# Patient Record
Sex: Female | Born: 1985 | Race: Black or African American | Hispanic: No | Marital: Single | Smoking: Current every day smoker
Health system: Southern US, Community
[De-identification: ages and names within clinical notes are randomized; demographics above are authoritative.]

## PROBLEM LIST (undated history)

## (undated) ENCOUNTER — Inpatient Hospital Stay (HOSPITAL_COMMUNITY): Payer: Self-pay

## (undated) ENCOUNTER — Inpatient Hospital Stay (HOSPITAL_COMMUNITY): Payer: Medicaid Other

## (undated) DIAGNOSIS — I10 Essential (primary) hypertension: Secondary | ICD-10-CM

## (undated) DIAGNOSIS — F53 Postpartum depression: Secondary | ICD-10-CM

## (undated) DIAGNOSIS — A749 Chlamydial infection, unspecified: Secondary | ICD-10-CM

## (undated) DIAGNOSIS — O99345 Other mental disorders complicating the puerperium: Secondary | ICD-10-CM

## (undated) DIAGNOSIS — E039 Hypothyroidism, unspecified: Secondary | ICD-10-CM

## (undated) DIAGNOSIS — O09219 Supervision of pregnancy with history of pre-term labor, unspecified trimester: Secondary | ICD-10-CM

## (undated) DIAGNOSIS — D649 Anemia, unspecified: Secondary | ICD-10-CM

## (undated) DIAGNOSIS — R011 Cardiac murmur, unspecified: Secondary | ICD-10-CM

## (undated) DIAGNOSIS — N883 Incompetence of cervix uteri: Secondary | ICD-10-CM

## (undated) HISTORY — PX: CERVICAL CERCLAGE: SHX1329

## (undated) HISTORY — DX: Essential (primary) hypertension: I10

## (undated) HISTORY — DX: Incompetence of cervix uteri: N88.3

## (undated) HISTORY — DX: Supervision of pregnancy with history of pre-term labor, unspecified trimester: O09.219

## (undated) HISTORY — DX: Postpartum depression: F53.0

## (undated) HISTORY — DX: Other mental disorders complicating the puerperium: O99.345

## (undated) HISTORY — DX: Anemia, unspecified: D64.9

## (undated) HISTORY — PX: PILONIDAL CYST / SINUS EXCISION: SUR543

## (undated) HISTORY — DX: Chlamydial infection, unspecified: A74.9

---

## 2004-12-17 DIAGNOSIS — F53 Postpartum depression: Secondary | ICD-10-CM

## 2004-12-17 HISTORY — DX: Postpartum depression: F53.0

## 2005-01-29 ENCOUNTER — Ambulatory Visit (HOSPITAL_COMMUNITY): Admission: AD | Admit: 2005-01-29 | Discharge: 2005-01-29 | Payer: Self-pay | Admitting: *Deleted

## 2005-02-03 ENCOUNTER — Ambulatory Visit (HOSPITAL_COMMUNITY): Admission: AD | Admit: 2005-02-03 | Discharge: 2005-02-03 | Payer: Self-pay | Admitting: *Deleted

## 2010-03-30 ENCOUNTER — Inpatient Hospital Stay (HOSPITAL_COMMUNITY): Admission: AD | Admit: 2010-03-30 | Discharge: 2010-03-30 | Payer: Self-pay | Admitting: Obstetrics and Gynecology

## 2010-04-05 ENCOUNTER — Inpatient Hospital Stay (HOSPITAL_COMMUNITY): Admission: AD | Admit: 2010-04-05 | Discharge: 2010-04-05 | Payer: Self-pay | Admitting: Obstetrics and Gynecology

## 2010-05-04 ENCOUNTER — Ambulatory Visit: Payer: Self-pay | Admitting: Obstetrics and Gynecology

## 2010-05-04 ENCOUNTER — Encounter (INDEPENDENT_AMBULATORY_CARE_PROVIDER_SITE_OTHER): Payer: Self-pay | Admitting: *Deleted

## 2010-05-19 ENCOUNTER — Encounter: Payer: Self-pay | Admitting: Family Medicine

## 2010-05-19 ENCOUNTER — Ambulatory Visit (HOSPITAL_COMMUNITY): Admission: RE | Admit: 2010-05-19 | Discharge: 2010-05-19 | Payer: Self-pay | Admitting: Family Medicine

## 2010-05-26 ENCOUNTER — Emergency Department (HOSPITAL_COMMUNITY): Admission: EM | Admit: 2010-05-26 | Discharge: 2010-05-26 | Payer: Self-pay | Admitting: Emergency Medicine

## 2010-06-01 ENCOUNTER — Ambulatory Visit: Payer: Self-pay | Admitting: Obstetrics & Gynecology

## 2010-06-03 ENCOUNTER — Emergency Department (HOSPITAL_COMMUNITY): Admission: EM | Admit: 2010-06-03 | Discharge: 2010-06-03 | Payer: Self-pay | Admitting: Emergency Medicine

## 2010-06-07 ENCOUNTER — Ambulatory Visit: Payer: Self-pay | Admitting: Obstetrics & Gynecology

## 2010-06-07 ENCOUNTER — Ambulatory Visit (HOSPITAL_COMMUNITY): Admission: RE | Admit: 2010-06-07 | Discharge: 2010-06-07 | Payer: Self-pay | Admitting: Obstetrics & Gynecology

## 2010-06-14 ENCOUNTER — Inpatient Hospital Stay (HOSPITAL_COMMUNITY): Admission: AD | Admit: 2010-06-14 | Discharge: 2010-06-14 | Payer: Self-pay | Admitting: Obstetrics & Gynecology

## 2010-06-22 ENCOUNTER — Ambulatory Visit: Payer: Self-pay | Admitting: Family Medicine

## 2010-06-22 ENCOUNTER — Encounter (INDEPENDENT_AMBULATORY_CARE_PROVIDER_SITE_OTHER): Payer: Self-pay | Admitting: *Deleted

## 2010-06-22 ENCOUNTER — Other Ambulatory Visit: Payer: Self-pay | Admitting: Obstetrics & Gynecology

## 2010-06-22 LAB — CONVERTED CEMR LAB
Chlamydia, DNA Probe: NEGATIVE
GC Probe Amp, Genital: NEGATIVE

## 2010-06-23 ENCOUNTER — Encounter (INDEPENDENT_AMBULATORY_CARE_PROVIDER_SITE_OTHER): Payer: Self-pay | Admitting: *Deleted

## 2010-06-23 LAB — CONVERTED CEMR LAB: Trich, Wet Prep: NONE SEEN

## 2010-07-04 ENCOUNTER — Encounter: Payer: Self-pay | Admitting: Family Medicine

## 2010-07-04 ENCOUNTER — Ambulatory Visit (HOSPITAL_COMMUNITY): Admission: RE | Admit: 2010-07-04 | Discharge: 2010-07-04 | Payer: Self-pay | Admitting: Family Medicine

## 2010-07-27 ENCOUNTER — Ambulatory Visit: Payer: Self-pay | Admitting: Obstetrics & Gynecology

## 2010-07-28 ENCOUNTER — Encounter: Payer: Self-pay | Admitting: Obstetrics & Gynecology

## 2010-07-28 ENCOUNTER — Encounter (INDEPENDENT_AMBULATORY_CARE_PROVIDER_SITE_OTHER): Payer: Self-pay | Admitting: *Deleted

## 2010-07-28 LAB — CONVERTED CEMR LAB: Trich, Wet Prep: NONE SEEN

## 2010-08-10 ENCOUNTER — Ambulatory Visit: Payer: Self-pay | Admitting: Obstetrics and Gynecology

## 2010-08-10 ENCOUNTER — Ambulatory Visit (HOSPITAL_COMMUNITY): Admission: RE | Admit: 2010-08-10 | Discharge: 2010-08-10 | Payer: Self-pay | Admitting: Family Medicine

## 2010-08-10 ENCOUNTER — Encounter: Payer: Self-pay | Admitting: Family Medicine

## 2010-08-16 ENCOUNTER — Ambulatory Visit: Payer: Self-pay | Admitting: Advanced Practice Midwife

## 2010-08-16 ENCOUNTER — Inpatient Hospital Stay (HOSPITAL_COMMUNITY): Admission: AD | Admit: 2010-08-16 | Discharge: 2010-08-17 | Payer: Self-pay | Admitting: Obstetrics & Gynecology

## 2010-08-28 ENCOUNTER — Ambulatory Visit: Payer: Self-pay | Admitting: Obstetrics & Gynecology

## 2010-08-30 ENCOUNTER — Ambulatory Visit: Payer: Self-pay | Admitting: Obstetrics & Gynecology

## 2010-09-21 ENCOUNTER — Ambulatory Visit: Payer: Self-pay | Admitting: Obstetrics & Gynecology

## 2010-09-21 ENCOUNTER — Inpatient Hospital Stay (HOSPITAL_COMMUNITY): Admission: AD | Admit: 2010-09-21 | Discharge: 2010-09-21 | Payer: Self-pay | Admitting: Obstetrics & Gynecology

## 2010-10-18 ENCOUNTER — Ambulatory Visit: Payer: Self-pay | Admitting: Family Medicine

## 2010-10-18 ENCOUNTER — Inpatient Hospital Stay (HOSPITAL_COMMUNITY): Admission: AD | Admit: 2010-10-18 | Discharge: 2010-10-18 | Payer: Self-pay | Admitting: Family Medicine

## 2010-10-26 ENCOUNTER — Encounter (INDEPENDENT_AMBULATORY_CARE_PROVIDER_SITE_OTHER): Payer: Self-pay | Admitting: *Deleted

## 2010-10-26 ENCOUNTER — Ambulatory Visit: Payer: Self-pay | Admitting: Obstetrics & Gynecology

## 2010-10-26 LAB — CONVERTED CEMR LAB: GC Probe Amp, Genital: NEGATIVE

## 2010-10-27 ENCOUNTER — Encounter (INDEPENDENT_AMBULATORY_CARE_PROVIDER_SITE_OTHER): Payer: Self-pay | Admitting: *Deleted

## 2010-11-02 ENCOUNTER — Encounter (INDEPENDENT_AMBULATORY_CARE_PROVIDER_SITE_OTHER): Payer: Self-pay | Admitting: *Deleted

## 2010-11-02 ENCOUNTER — Ambulatory Visit: Payer: Self-pay | Admitting: Obstetrics & Gynecology

## 2010-11-03 ENCOUNTER — Encounter (INDEPENDENT_AMBULATORY_CARE_PROVIDER_SITE_OTHER): Payer: Self-pay | Admitting: *Deleted

## 2010-11-03 LAB — CONVERTED CEMR LAB
HCT: 32.5 % — ABNORMAL LOW (ref 36.0–46.0)
Hemoglobin: 10.4 g/dL — ABNORMAL LOW (ref 12.0–15.0)
MCV: 82.3 fL (ref 78.0–100.0)
Platelets: 307 10*3/uL (ref 150–400)
WBC: 11.5 10*3/uL — ABNORMAL HIGH (ref 4.0–10.5)

## 2010-11-06 ENCOUNTER — Ambulatory Visit: Payer: Self-pay | Admitting: Obstetrics & Gynecology

## 2010-11-20 ENCOUNTER — Inpatient Hospital Stay (HOSPITAL_COMMUNITY)
Admission: AD | Admit: 2010-11-20 | Discharge: 2010-11-20 | Payer: Self-pay | Source: Home / Self Care | Admitting: Obstetrics and Gynecology

## 2010-11-20 ENCOUNTER — Ambulatory Visit: Payer: Self-pay | Admitting: Obstetrics & Gynecology

## 2010-11-22 ENCOUNTER — Observation Stay (HOSPITAL_COMMUNITY)
Admission: AD | Admit: 2010-11-22 | Discharge: 2010-11-22 | Payer: Self-pay | Source: Home / Self Care | Admitting: Obstetrics and Gynecology

## 2010-11-23 ENCOUNTER — Inpatient Hospital Stay (HOSPITAL_COMMUNITY)
Admission: AD | Admit: 2010-11-23 | Discharge: 2010-11-26 | Payer: Self-pay | Source: Home / Self Care | Attending: Obstetrics and Gynecology | Admitting: Obstetrics and Gynecology

## 2010-11-27 ENCOUNTER — Ambulatory Visit: Payer: Self-pay | Admitting: Physician Assistant

## 2010-12-13 ENCOUNTER — Ambulatory Visit: Payer: Self-pay | Admitting: Obstetrics & Gynecology

## 2010-12-17 NOTE — L&D Delivery Note (Signed)
Delivery Note At 8:01 AM a viable female was delivered via Vaginal, Spontaneous Delivery (Presentation: Left Occiput Anterior).  APGAR: 9/9, ; weight .   Placenta status: Intact, Spontaneous.  Cord: 3 vessels with the following complications: None.   Anesthesia: None  Episiotomy: None Lacerations: None Suture Repair: n/a Est. Blood Loss (mL): 400  Mom to postpartum.  Baby to nursery-stable.  CRESENZO-DISHMAN,Teigan Sahli 10/19/2011, 8:18 AM

## 2011-01-04 ENCOUNTER — Emergency Department (HOSPITAL_COMMUNITY)
Admission: EM | Admit: 2011-01-04 | Discharge: 2011-01-04 | Payer: Self-pay | Source: Home / Self Care | Admitting: Emergency Medicine

## 2011-01-07 ENCOUNTER — Encounter: Payer: Self-pay | Admitting: Family Medicine

## 2011-02-27 LAB — POCT URINALYSIS DIPSTICK
Bilirubin Urine: NEGATIVE
Bilirubin Urine: NEGATIVE
Glucose, UA: NEGATIVE mg/dL
Glucose, UA: NEGATIVE mg/dL
Ketones, ur: NEGATIVE mg/dL
Ketones, ur: NEGATIVE mg/dL
Nitrite: NEGATIVE
Nitrite: NEGATIVE
Nitrite: NEGATIVE
Protein, ur: 30 mg/dL — AB
Protein, ur: NEGATIVE mg/dL
Protein, ur: NEGATIVE mg/dL
Specific Gravity, Urine: >=1.03 (ref 1.005–1.030)
Specific Gravity, Urine: >=1.03 (ref 1.005–1.030)
Urobilinogen, UA: 1 mg/dL (ref 0.0–1.0)
Urobilinogen, UA: 4 mg/dL — ABNORMAL HIGH (ref 0.0–1.0)
pH: 6.5 (ref 5.0–8.0)
pH: 6 (ref 5.0–8.0)
pH: 6 (ref 5.0–8.0)

## 2011-02-27 LAB — CBC
HCT: 31.5 % — ABNORMAL LOW (ref 36.0–46.0)
HCT: 33.5 % — ABNORMAL LOW (ref 36.0–46.0)
Hemoglobin: 11.2 g/dL — ABNORMAL LOW (ref 12.0–15.0)
MCH: 27.6 pg (ref 26.0–34.0)
MCH: 28.4 pg (ref 26.0–34.0)
MCHC: 33.7 g/dL (ref 30.0–36.0)
MCV: 83.6 fL (ref 78.0–100.0)
MCV: 84 fL (ref 78.0–100.0)
MCV: 84.2 fL (ref 78.0–100.0)
Platelets: 284 10*3/uL (ref 150–400)
RBC: 3.29 MIL/uL — ABNORMAL LOW (ref 3.87–5.11)
RBC: 3.99 MIL/uL (ref 3.87–5.11)
RDW: 15.1 % (ref 11.5–15.5)
RDW: 15.2 % (ref 11.5–15.5)
RDW: 15.5 % (ref 11.5–15.5)
WBC: 10.9 10*3/uL — ABNORMAL HIGH (ref 4.0–10.5)
WBC: 13.4 10*3/uL — ABNORMAL HIGH (ref 4.0–10.5)

## 2011-02-27 LAB — URINE MICROSCOPIC-ADD ON

## 2011-02-27 LAB — RAPID URINE DRUG SCREEN, HOSP PERFORMED
Amphetamines: NOT DETECTED
Barbiturates: NOT DETECTED
Benzodiazepines: NOT DETECTED
Opiates: NOT DETECTED
Tetrahydrocannabinol: NOT DETECTED

## 2011-02-27 LAB — COMPREHENSIVE METABOLIC PANEL
ALT: 15 U/L (ref 0–35)
Alkaline Phosphatase: 155 U/L — ABNORMAL HIGH (ref 39–117)
BUN: 8 mg/dL (ref 6–23)
CO2: 23 mEq/L (ref 19–32)
Chloride: 101 mEq/L (ref 96–112)
Glucose, Bld: 83 mg/dL (ref 70–99)
Potassium: 3.8 mEq/L (ref 3.5–5.1)
Sodium: 132 mEq/L — ABNORMAL LOW (ref 135–145)
Total Bilirubin: 0.2 mg/dL — ABNORMAL LOW (ref 0.3–1.2)
Total Protein: 6.4 g/dL (ref 6.0–8.3)

## 2011-02-27 LAB — URINALYSIS, ROUTINE W REFLEX MICROSCOPIC
Urobilinogen, UA: 1 mg/dL (ref 0.0–1.0)
pH: 6 (ref 5.0–8.0)

## 2011-02-27 LAB — PROTEIN / CREATININE RATIO, URINE: Protein Creatinine Ratio: 0.11 (ref 0.00–0.15)

## 2011-02-27 LAB — WET PREP, GENITAL: Yeast Wet Prep HPF POC: NONE SEEN

## 2011-03-01 LAB — POCT URINALYSIS DIPSTICK
Bilirubin Urine: NEGATIVE
Glucose, UA: NEGATIVE mg/dL
Glucose, UA: NEGATIVE mg/dL
Nitrite: NEGATIVE
Nitrite: NEGATIVE
Urobilinogen, UA: 2 mg/dL — ABNORMAL HIGH (ref 0.0–1.0)
pH: 5.5 (ref 5.0–8.0)

## 2011-03-02 LAB — POCT URINALYSIS DIPSTICK
Glucose, UA: NEGATIVE mg/dL
Glucose, UA: NEGATIVE mg/dL
Ketones, ur: NEGATIVE mg/dL
Nitrite: NEGATIVE
Protein, ur: NEGATIVE mg/dL
Specific Gravity, Urine: 1.015 (ref 1.005–1.030)
Specific Gravity, Urine: >=1.03 (ref 1.005–1.030)
Urobilinogen, UA: 1 mg/dL (ref 0.0–1.0)

## 2011-03-04 LAB — CBC
HCT: 36.4 % (ref 36.0–46.0)
Hemoglobin: 12.2 g/dL (ref 12.0–15.0)
WBC: 8 10*3/uL (ref 4.0–10.5)

## 2011-03-04 LAB — POCT URINALYSIS DIP (DEVICE)
Bilirubin Urine: NEGATIVE
Glucose, UA: NEGATIVE mg/dL
Ketones, ur: NEGATIVE mg/dL
Ketones, ur: NEGATIVE mg/dL
Nitrite: NEGATIVE
Protein, ur: NEGATIVE mg/dL
Specific Gravity, Urine: >=1.03 (ref 1.005–1.030)
Specific Gravity, Urine: 1.02 (ref 1.005–1.030)
Urobilinogen, UA: 1 mg/dL (ref 0.0–1.0)
pH: 5.5 (ref 5.0–8.0)

## 2011-03-04 LAB — URINALYSIS, ROUTINE W REFLEX MICROSCOPIC
Glucose, UA: NEGATIVE mg/dL
Specific Gravity, Urine: >1.03 — ABNORMAL HIGH (ref 1.005–1.030)
pH: 6 (ref 5.0–8.0)

## 2011-03-04 LAB — URINE MICROSCOPIC-ADD ON

## 2011-03-05 LAB — POCT URINALYSIS DIP (DEVICE)
Protein, ur: NEGATIVE mg/dL
Specific Gravity, Urine: >=1.03 (ref 1.005–1.030)
Urobilinogen, UA: 1 mg/dL (ref 0.0–1.0)

## 2011-03-06 LAB — URINALYSIS, ROUTINE W REFLEX MICROSCOPIC
Glucose, UA: NEGATIVE mg/dL
Ketones, ur: NEGATIVE mg/dL
Protein, ur: NEGATIVE mg/dL

## 2011-03-06 LAB — URINE CULTURE

## 2011-03-07 LAB — URINALYSIS, ROUTINE W REFLEX MICROSCOPIC
Bilirubin Urine: NEGATIVE
Ketones, ur: NEGATIVE mg/dL
Nitrite: NEGATIVE
Specific Gravity, Urine: 1.025 (ref 1.005–1.030)
Urobilinogen, UA: 0.2 mg/dL (ref 0.0–1.0)

## 2011-05-04 ENCOUNTER — Inpatient Hospital Stay (HOSPITAL_COMMUNITY): Payer: Medicaid Other

## 2011-05-04 ENCOUNTER — Inpatient Hospital Stay (HOSPITAL_COMMUNITY)
Admission: AD | Admit: 2011-05-04 | Discharge: 2011-05-04 | Disposition: A | Discharge status: Home / Self Care | Payer: Medicaid Other | Source: Ambulatory Visit | Attending: Family Medicine | Admitting: Family Medicine

## 2011-05-04 ENCOUNTER — Encounter (HOSPITAL_COMMUNITY): Payer: Self-pay | Admitting: Radiology

## 2011-05-04 DIAGNOSIS — R109 Unspecified abdominal pain: Secondary | ICD-10-CM | POA: Insufficient documentation

## 2011-05-04 DIAGNOSIS — A499 Bacterial infection, unspecified: Secondary | ICD-10-CM

## 2011-05-04 DIAGNOSIS — O2390 Unspecified genitourinary tract infection in pregnancy, unspecified trimester: Secondary | ICD-10-CM

## 2011-05-04 DIAGNOSIS — B9689 Other specified bacterial agents as the cause of diseases classified elsewhere: Secondary | ICD-10-CM | POA: Insufficient documentation

## 2011-05-04 DIAGNOSIS — N760 Acute vaginitis: Secondary | ICD-10-CM | POA: Insufficient documentation

## 2011-05-04 LAB — URINALYSIS, ROUTINE W REFLEX MICROSCOPIC
Glucose, UA: NEGATIVE mg/dL
Leukocytes, UA: NEGATIVE
Specific Gravity, Urine: 1.02 (ref 1.005–1.030)
pH: 6 (ref 5.0–8.0)

## 2011-05-04 LAB — CBC
Platelets: 296 10*3/uL (ref 150–400)
RDW: 15 % (ref 11.5–15.5)
WBC: 3.8 10*3/uL — ABNORMAL LOW (ref 4.0–10.5)

## 2011-05-04 LAB — HCG, QUANTITATIVE, PREGNANCY: hCG, Beta Chain, Quant, S: 41283 m[IU]/mL — ABNORMAL HIGH (ref <5)

## 2011-05-04 LAB — WET PREP, GENITAL: Trich, Wet Prep: NONE SEEN

## 2011-05-04 LAB — URINE MICROSCOPIC-ADD ON

## 2011-05-04 NOTE — Discharge Summary (Signed)
Kylie Hicks, Kylie Hicks               ACCOUNT NO.:  [id number]   MEDICAL RECORD NO.:  [id number]          PATIENT TYPE:  OIB   LOCATION:  LDR3                          FACILITY:  [facility]   PHYSICIAN:  Langley Gauss, MD     DATE OF BIRTH:  Feb 05, 1986   DATE OF ADMISSION:  01/29/2005  DATE OF DISCHARGE:  02/13/2006LH                                 DISCHARGE SUMMARY   The patient is a 25 year old gravida 1, para 0 with an EDC of February 14, 2005  based on an 15-week ultrasound. The patient has received prenatal care  through the office of Dr. Dianne Dun in [place], [place]. The patient  presents to [facility] complaining of uterine contractions,  presenting as an OB unassigned patient. She denies any vaginal bleeding, any  leakage of fluid. Contractions have been occurring intermittently since  about the February 7.   The patient states her pain course has been complicated by findings of  presentation to their office on January 23, 2005 at which time she was noted  to have some vaginal bleeding. Review of the records indicate that she was  only having spotting. She was referred to their labor and delivery at that  time and discharged home after a 30-minute evaluation, once she was  determined not to be in labor. Subsequently, she had follow up at their  office on January 25, 2005 at which time she states her questions were not  answered, and it was unclear what any future plans were for delivery. The  patient is convinced that she has been told that the baby is very small for  gestational age, states that she has been told that the baby weighs only 3  pounds and 8 ounces. She states that Dr. Dianne Dun has cross covered with Dr.  Christain Sacramento in [place]. There is a midwife named Tammy working at Dr. Marcheta Grammes  office who no longer by her report has hospital privileges. Thus, there  certainly has been, according to the patient, a tremendous amount of  confusion to her regarding her management,  as she may hear something from  Tammy at the office which then may not be followed through at the hospital  due to her lack of presumed hospital privileges. We did receive and review  [facility] prenatal records which were requested and  obtained. They were reviewed in their entirety by Dr. Langley Gauss. There  was evidence of two early ultrasounds, one initially at 15 weeks and a  followup at 19 weeks. There is a note indicating that a level 2 ultrasound  was requested at [facility] at about [redacted] weeks gestation; however, the report is not  available from this. The patient also indicates that she has had subsequent  ultrasounds performed since that point in time. Note does indicate that  patient is somewhat noncompliant and that prenatal profile was requested on  two occasions. In addition, the patient failed to present and appropriately  have AFP extra triple screen performed when it was requested.   The patient's past medical history is negative. She denies any medical or  surgical history. She denies any known drug allergies.   PHYSICAL EXAMINATION:  GENERAL:  She is noted to be in no acute distress.  She was at one point in time somewhat agitated while talking via phone with  the father of the baby.  VITAL SIGNS:  Blood pressure is 139/77, pulse rate of 73, respiratory rate  is 20.  HEENT:  Negative.  NECK:  No adenopathy. Neck is supple. Thyroid is nonpalpable.  LUNGS:  Clear.  CARDIOVASCULAR:  Regular rate and rhythm.  ABDOMEN:  Soft and nontender. She has vertex presentation by Leopold's  maneuvers. No surgical scars were identified. She is noted to be vertex  presentation.  EXTREMITIES:  Noted to be normal.  PELVIC:  Normal external genitalia. No lesions or ulcerations identified. No  leakage of fluid. No vaginal bleeding. Cervix is noted to be 4 cm dilated,  70% effaced. The vertex is 0 station and well applied to the cervix.   External fetal monitor. A  nonstress is requested due to insufficient  prenatal care as patient presented to [facility] as an OB  unassigned patient. This reveals a fetal heart rate baseline of 130 to 140.  Accelerations are noted greater than 15 beats per minute for greater than 15  seconds duration. Normal long term variability. No decelerations of fetal  heart tones are noted. Normal long term variability. External toco reveals  no uterine activity identified. Though patient did not specifically mark  fetal movements, it is known that episodes of fetal heart accelerations are  due to fetal movement. Thus, final interpretation is reactive nonstress  test. No evidence of active uterine contractions.   LABORATORY DATA:  Did obtain a GBS culture status from the facility which is  noted to be negative.   ASSESSMENT/PLAN:  The patient at about 38+ weeks gestation presenting not in  active labor. Certainly, she has had some episodes of previous uterine  contractions with her cervix dilated to nearly 4 cm. Pelvis is noted to be  clinically adequate. Discussed with the patient that I will be willing to  accept transfer of care to our office. Thus, she has a followup initial  appointment at our office scheduled for 10 a.m. January 30, 2005. At that  point in time, additional records can be requested from the office of Dr.  Dianne Dun which may give some insight into what the level 2 ultrasound at [facility]  was performed showed. In addition, this will allow to assess whether there  is any degree of growth restriction in this intrauterine pregnancy.   Additional laboratory studies:  Urine drug screen is performed which is  noted to be negative for all substances tested.      DC/MEDQ  D:  01/30/2005  T:  01/30/2005  Job:  [id number]

## 2011-05-05 LAB — GC/CHLAMYDIA PROBE AMP, GENITAL: Chlamydia, DNA Probe: NEGATIVE

## 2011-05-05 LAB — URINE CULTURE

## 2011-06-07 LAB — ABO/RH: RH Type: POSITIVE

## 2011-06-14 ENCOUNTER — Other Ambulatory Visit: Payer: Self-pay | Admitting: Obstetrics & Gynecology

## 2011-06-14 DIAGNOSIS — M259 Joint disorder, unspecified: Secondary | ICD-10-CM

## 2011-06-14 DIAGNOSIS — Z3689 Encounter for other specified antenatal screening: Secondary | ICD-10-CM

## 2011-06-14 DIAGNOSIS — O09219 Supervision of pregnancy with history of pre-term labor, unspecified trimester: Secondary | ICD-10-CM

## 2011-06-14 DIAGNOSIS — O9989 Other specified diseases and conditions complicating pregnancy, childbirth and the puerperium: Secondary | ICD-10-CM

## 2011-06-14 DIAGNOSIS — O99330 Smoking (tobacco) complicating pregnancy, unspecified trimester: Secondary | ICD-10-CM

## 2011-06-14 DIAGNOSIS — M899 Disorder of bone, unspecified: Secondary | ICD-10-CM

## 2011-06-14 DIAGNOSIS — O99340 Other mental disorders complicating pregnancy, unspecified trimester: Secondary | ICD-10-CM

## 2011-06-14 LAB — POCT URINALYSIS DIP (DEVICE)
Leukocytes, UA: NEGATIVE
pH: 5.5 (ref 5.0–8.0)

## 2011-06-14 LAB — HIV ANTIBODY (ROUTINE TESTING W REFLEX): HIV: NONREACTIVE

## 2011-06-14 LAB — ANTIBODY SCREEN: Antibody Screen: NEGATIVE

## 2011-06-15 ENCOUNTER — Ambulatory Visit (HOSPITAL_COMMUNITY)
Admission: RE | Admit: 2011-06-15 | Discharge: 2011-06-15 | Disposition: A | Discharge status: Home / Self Care | Payer: Medicaid Other | Source: Ambulatory Visit | Attending: Obstetrics & Gynecology | Admitting: Obstetrics & Gynecology

## 2011-06-15 DIAGNOSIS — O3430 Maternal care for cervical incompetence, unspecified trimester: Secondary | ICD-10-CM | POA: Insufficient documentation

## 2011-06-15 LAB — CBC
HCT: 34.1 % — ABNORMAL LOW (ref 36.0–46.0)
Hemoglobin: 11.4 g/dL — ABNORMAL LOW (ref 12.0–15.0)
MCHC: 33.4 g/dL (ref 30.0–36.0)

## 2011-06-19 ENCOUNTER — Ambulatory Visit (HOSPITAL_COMMUNITY)
Admission: RE | Admit: 2011-06-19 | Discharge: 2011-06-19 | Disposition: A | Discharge status: Home / Self Care | Payer: Medicaid Other | Source: Ambulatory Visit | Attending: Obstetrics & Gynecology | Admitting: Obstetrics & Gynecology

## 2011-06-19 ENCOUNTER — Other Ambulatory Visit: Payer: Self-pay | Admitting: Obstetrics & Gynecology

## 2011-06-19 DIAGNOSIS — E669 Obesity, unspecified: Secondary | ICD-10-CM | POA: Insufficient documentation

## 2011-06-19 DIAGNOSIS — O358XX0 Maternal care for other (suspected) fetal abnormality and damage, not applicable or unspecified: Secondary | ICD-10-CM | POA: Insufficient documentation

## 2011-06-19 DIAGNOSIS — Z363 Encounter for antenatal screening for malformations: Secondary | ICD-10-CM | POA: Insufficient documentation

## 2011-06-19 DIAGNOSIS — Z3689 Encounter for other specified antenatal screening: Secondary | ICD-10-CM

## 2011-06-19 DIAGNOSIS — Z8751 Personal history of pre-term labor: Secondary | ICD-10-CM | POA: Insufficient documentation

## 2011-06-19 DIAGNOSIS — Z1389 Encounter for screening for other disorder: Secondary | ICD-10-CM | POA: Insufficient documentation

## 2011-06-19 DIAGNOSIS — O09299 Supervision of pregnancy with other poor reproductive or obstetric history, unspecified trimester: Secondary | ICD-10-CM | POA: Insufficient documentation

## 2011-06-19 DIAGNOSIS — O3430 Maternal care for cervical incompetence, unspecified trimester: Secondary | ICD-10-CM

## 2011-06-19 DIAGNOSIS — O3440 Maternal care for other abnormalities of cervix, unspecified trimester: Secondary | ICD-10-CM | POA: Insufficient documentation

## 2011-06-19 DIAGNOSIS — O99330 Smoking (tobacco) complicating pregnancy, unspecified trimester: Secondary | ICD-10-CM | POA: Insufficient documentation

## 2011-06-19 DIAGNOSIS — O352XX0 Maternal care for (suspected) hereditary disease in fetus, not applicable or unspecified: Secondary | ICD-10-CM | POA: Insufficient documentation

## 2011-06-21 ENCOUNTER — Ambulatory Visit: Payer: Medicaid Other

## 2011-06-24 NOTE — Op Note (Signed)
  [name], Kylie Hicks               ACCOUNT NO.:  [id number]  MEDICAL RECORD NO.:  [id number]  LOCATION:  [facility]                          FACILITY:  [facility]  PHYSICIAN:  Lesly Dukes, M.D. DATE OF BIRTH:  [birth year]  DATE OF PROCEDURE:  06/15/2011 DATE OF DISCHARGE:                              OPERATIVE REPORT   PREOPERATIVE DIAGNOSES: 54. A 25 year old G6, para 4-1-0-5 at 92 weeks and 2 days' estimated     gestational age with dilated cervix today. 2. History of incompetent cervix. 3. History of cerclage x2.  POSTOPERATIVE DIAGNOSES: 8. A 25 year old G6, para 4-1-0-5 at 71 weeks and 2 days' estimated     gestational age with dilated cervix today. 2. History of incompetent cervix. 3. History of cerclage x2.  PROCEDURE:  Rescue cerclage, [employer].  SURGEON:  Lesly Dukes, MD  ASSISTANT:  None.  ANESTHESIA:  Spinal.  FINDINGS:  External os 3+ cm, internal os 1-2 cm.  Cervical length approximately 2-3 cm.  PATHOLOGY:  None.  ESTIMATED BLOOD LOSS:  75 mL.  DESCRIPTION OF PROCEDURE:  After informed consent was obtained, the patient was taken to the operating room where spinal anesthesia was induced.  The patient was placed in the dorsal lithotomy position and prepared with normal sterile fashion.  SCDs were on lower extremities. A Foley was placed in the bladder.  The patient was prepared in normal sterile fashion.  The weighted speculum was placed into the patient's vagina and retractors were placed anteriorly and laterally to visualize the cervix.  Cervix was grasped with ring forceps.  There was presence of a scar on the cervix and prior cerclages.  The [employer] cerclage was placed at this level, approximately 2 cm from the portio.  A #1 Prolene was used to place the cerclage in a circumferential fashion around the cervix.  The knot was tied anteriorly at 12 o'clock.  Postprocedure, the cervix was closed.  There was no bleeding at the end of procedure.  The  patient tolerated the well.  Sponge, lap, instrument, and needle count were correct x2 and the patient recovered in stable condition.     Lesly Dukes, M.D.     Lora Paula  D:  06/15/2011  T:  06/16/2011  Job:  [id number]  Electronically Signed by Elsie Lincoln M.D. on 06/24/2011 09:10:26 PM

## 2011-06-28 ENCOUNTER — Other Ambulatory Visit: Payer: Self-pay | Admitting: Obstetrics and Gynecology

## 2011-06-28 DIAGNOSIS — O26879 Cervical shortening, unspecified trimester: Secondary | ICD-10-CM

## 2011-06-28 DIAGNOSIS — O0930 Supervision of pregnancy with insufficient antenatal care, unspecified trimester: Secondary | ICD-10-CM

## 2011-06-28 LAB — POCT URINALYSIS DIP (DEVICE)
Bilirubin Urine: NEGATIVE
Nitrite: NEGATIVE
Specific Gravity, Urine: 1.02 (ref 1.005–1.030)
pH: 7 (ref 5.0–8.0)

## 2011-08-09 ENCOUNTER — Other Ambulatory Visit: Payer: Self-pay | Admitting: Obstetrics & Gynecology

## 2011-08-09 ENCOUNTER — Other Ambulatory Visit: Payer: Self-pay | Admitting: Physician Assistant

## 2011-08-09 DIAGNOSIS — M899 Disorder of bone, unspecified: Secondary | ICD-10-CM

## 2011-08-09 DIAGNOSIS — O9989 Other specified diseases and conditions complicating pregnancy, childbirth and the puerperium: Secondary | ICD-10-CM

## 2011-08-09 DIAGNOSIS — O99340 Other mental disorders complicating pregnancy, unspecified trimester: Secondary | ICD-10-CM

## 2011-08-09 DIAGNOSIS — M259 Joint disorder, unspecified: Secondary | ICD-10-CM

## 2011-08-09 DIAGNOSIS — O09219 Supervision of pregnancy with history of pre-term labor, unspecified trimester: Secondary | ICD-10-CM

## 2011-08-09 DIAGNOSIS — O99330 Smoking (tobacco) complicating pregnancy, unspecified trimester: Secondary | ICD-10-CM

## 2011-08-09 LAB — POCT URINALYSIS DIP (DEVICE)
Bilirubin Urine: NEGATIVE
Glucose, UA: NEGATIVE mg/dL
Ketones, ur: NEGATIVE mg/dL
Leukocytes, UA: NEGATIVE
Nitrite: NEGATIVE
Protein, ur: NEGATIVE mg/dL
Specific Gravity, Urine: 1.025 (ref 1.005–1.030)
Urobilinogen, UA: 4 mg/dL — ABNORMAL HIGH (ref 0.0–1.0)
pH: 6.5 (ref 5.0–8.0)

## 2011-08-16 ENCOUNTER — Ambulatory Visit: Payer: Medicaid Other

## 2011-08-23 ENCOUNTER — Encounter: Payer: Self-pay | Admitting: *Deleted

## 2011-08-30 ENCOUNTER — Ambulatory Visit (INDEPENDENT_AMBULATORY_CARE_PROVIDER_SITE_OTHER): Payer: Medicaid Other | Admitting: Family Medicine

## 2011-08-30 ENCOUNTER — Other Ambulatory Visit: Payer: Self-pay | Admitting: Family Medicine

## 2011-08-30 DIAGNOSIS — O09219 Supervision of pregnancy with history of pre-term labor, unspecified trimester: Secondary | ICD-10-CM

## 2011-08-30 LAB — POCT URINALYSIS DIP (DEVICE)
Glucose, UA: NEGATIVE mg/dL
Ketones, ur: NEGATIVE mg/dL
Specific Gravity, Urine: 1.025 (ref 1.005–1.030)

## 2011-08-30 MED ORDER — HYDROXYPROGESTERONE CAPROATE 250 MG/ML IM OIL
250.0000 mg | TOPICAL_OIL | INTRAMUSCULAR | Status: AC
  Administered 2011-08-30 – 2011-10-04 (×3): 250 mg via INTRAMUSCULAR

## 2011-09-04 ENCOUNTER — Encounter: Payer: Self-pay | Admitting: Obstetrics & Gynecology

## 2011-09-04 LAB — CULTURE, OB URINE: Colony Count: >=100000

## 2011-09-06 ENCOUNTER — Ambulatory Visit: Payer: Medicaid Other

## 2011-09-07 ENCOUNTER — Telehealth: Payer: Self-pay | Admitting: *Deleted

## 2011-09-07 MED ORDER — CEPHALEXIN 500 MG PO CAPS: 500.0000 mg | ORAL_CAPSULE | Freq: Four times a day (QID) | ORAL | Status: AC

## 2011-09-07 NOTE — Telephone Encounter (Signed)
Message copied by Jill Side on Fri Sep 07, 2011 12:27 PM ------      Message from: Reva Bores      Created: Thu Sep 06, 2011  1:53 PM       Needs rx for urine---Keflex 500 mg po qid x 7 days, no. 28, no rf

## 2011-09-07 NOTE — Telephone Encounter (Signed)
Called pt to notify of UTI and need for Rx - unable to leave message on pt phone because of "unavailable".  Rx ordered and sent electronically.

## 2011-09-10 DIAGNOSIS — F17200 Nicotine dependence, unspecified, uncomplicated: Secondary | ICD-10-CM | POA: Insufficient documentation

## 2011-09-10 DIAGNOSIS — O99891 Other specified diseases and conditions complicating pregnancy: Secondary | ICD-10-CM

## 2011-09-10 DIAGNOSIS — Z641 Problems related to multiparity: Secondary | ICD-10-CM

## 2011-09-10 DIAGNOSIS — O09219 Supervision of pregnancy with history of pre-term labor, unspecified trimester: Secondary | ICD-10-CM | POA: Insufficient documentation

## 2011-09-10 DIAGNOSIS — O09299 Supervision of pregnancy with other poor reproductive or obstetric history, unspecified trimester: Secondary | ICD-10-CM | POA: Insufficient documentation

## 2011-09-10 DIAGNOSIS — E039 Hypothyroidism, unspecified: Secondary | ICD-10-CM | POA: Insufficient documentation

## 2011-09-10 DIAGNOSIS — O09899 Supervision of other high risk pregnancies, unspecified trimester: Secondary | ICD-10-CM

## 2011-09-10 DIAGNOSIS — Z8639 Personal history of other endocrine, nutritional and metabolic disease: Secondary | ICD-10-CM

## 2011-09-10 DIAGNOSIS — I10 Essential (primary) hypertension: Secondary | ICD-10-CM | POA: Insufficient documentation

## 2011-09-10 DIAGNOSIS — Z8659 Personal history of other mental and behavioral disorders: Secondary | ICD-10-CM | POA: Insufficient documentation

## 2011-09-11 NOTE — Telephone Encounter (Signed)
Called pt- unable to leave message because voice mailbox has not been set up yet.

## 2011-09-13 NOTE — Telephone Encounter (Signed)
Pt did not show for her clinic appointment today. I called pharmacy to see if patient had picked up her prescription, and she had not, it is still waiting for her. Front desk also reports that letter came back as undeliverable.

## 2011-09-17 NOTE — Telephone Encounter (Signed)
Called patient and did not get an answer, unable to leave message- patient does not have any scheduled appointments , will ask front office to call patient and schedule ob fu and if unable to reach patient - send letter stating needs to reschedule appointment and also needs treatment for UTI, please call clinic

## 2011-09-17 NOTE — Telephone Encounter (Signed)
Pt has No Showed several appointments. Called pt phone and no answer. Sent certified letter, and they come back unclaimed.

## 2011-09-17 NOTE — Telephone Encounter (Signed)
Patient called front desk and was given her next OB fu appointment, then call transferred to nurses station. Informed patient per her last visit she has a UTI and needs treatment and her prescription has been sent to [facility], will call them and make sure is still available for pickup.  Pt. Voices understanding

## 2011-09-27 ENCOUNTER — Ambulatory Visit (INDEPENDENT_AMBULATORY_CARE_PROVIDER_SITE_OTHER): Payer: Medicaid Other | Admitting: Obstetrics & Gynecology

## 2011-09-27 VITALS — BP 128/81 | Temp 97.9°F | Ht 63.0 in | Wt 229.9 lb

## 2011-09-27 DIAGNOSIS — O09219 Supervision of pregnancy with history of pre-term labor, unspecified trimester: Secondary | ICD-10-CM

## 2011-09-27 LAB — POCT URINALYSIS DIP (DEVICE)
Bilirubin Urine: NEGATIVE
Ketones, ur: NEGATIVE mg/dL
Protein, ur: NEGATIVE mg/dL
Specific Gravity, Urine: 1.02 (ref 1.005–1.030)
pH: 7 (ref 5.0–8.0)

## 2011-09-27 MED ORDER — PANTOPRAZOLE SODIUM 40 MG PO TBEC: 40.0000 mg | DELAYED_RELEASE_TABLET | Freq: Every day | ORAL | Status: DC

## 2011-09-27 NOTE — Progress Notes (Signed)
Pt is having a lot of pressure. She has a lot of trouble with her breathing. Declines flu vaccine.

## 2011-09-27 NOTE — Progress Notes (Signed)
Pt encouraged to keep weekly appt. 17-P today IM.Marland Kitchen Exam- cerclage intact, cx long, FT dilalted, VTX/-1   C/O  Nausea, no reflux. Will Rx Protonix 40 mg daily

## 2011-10-04 ENCOUNTER — Ambulatory Visit (INDEPENDENT_AMBULATORY_CARE_PROVIDER_SITE_OTHER): Payer: Medicaid Other | Admitting: Physician Assistant

## 2011-10-04 VITALS — BP 130/85 | Temp 98.4°F | Wt 231.2 lb

## 2011-10-04 DIAGNOSIS — IMO0002 Reserved for concepts with insufficient information to code with codable children: Secondary | ICD-10-CM

## 2011-10-04 DIAGNOSIS — O09219 Supervision of pregnancy with history of pre-term labor, unspecified trimester: Secondary | ICD-10-CM

## 2011-10-04 DIAGNOSIS — O139 Gestational [pregnancy-induced] hypertension without significant proteinuria, unspecified trimester: Secondary | ICD-10-CM

## 2011-10-04 LAB — POCT URINALYSIS DIP (DEVICE)
Ketones, ur: NEGATIVE mg/dL
Protein, ur: NEGATIVE mg/dL
Specific Gravity, Urine: 1.015 (ref 1.005–1.030)
Urobilinogen, UA: 1 mg/dL (ref 0.0–1.0)

## 2011-10-04 NOTE — Progress Notes (Signed)
Pain- "comes across epigastric area.  Pressure- vaginal. No vaginal bleeding.  Pt needs GC/Chlamydia and GBS today.

## 2011-10-04 NOTE — Progress Notes (Signed)
Continued vaginal pressure, no blding no change in discharge. 17-p today. Will remove cerclage at next visit and obtain GBS and cultures. Discussed using Pepcid or protonix daily for GI symptoms. Growth [place] scheduled for next week.

## 2011-10-08 ENCOUNTER — Inpatient Hospital Stay (HOSPITAL_COMMUNITY)
Admission: AD | Admit: 2011-10-08 | Discharge: 2011-10-09 | Disposition: A | Discharge status: Home / Self Care | Payer: Medicaid Other | Source: Ambulatory Visit | Attending: Obstetrics & Gynecology | Admitting: Obstetrics & Gynecology

## 2011-10-08 ENCOUNTER — Encounter (HOSPITAL_COMMUNITY): Payer: Self-pay | Admitting: *Deleted

## 2011-10-08 DIAGNOSIS — O99891 Other specified diseases and conditions complicating pregnancy: Secondary | ICD-10-CM | POA: Insufficient documentation

## 2011-10-08 DIAGNOSIS — R51 Headache: Secondary | ICD-10-CM | POA: Insufficient documentation

## 2011-10-08 DIAGNOSIS — K219 Gastro-esophageal reflux disease without esophagitis: Secondary | ICD-10-CM | POA: Insufficient documentation

## 2011-10-08 MED ORDER — ACETAMINOPHEN 500 MG PO TABS
1000.0000 mg | ORAL_TABLET | Freq: Four times a day (QID) | ORAL | Status: DC | PRN
  Administered 2011-10-09: 1000 mg via ORAL
  Filled 2011-10-08: qty 2

## 2011-10-08 MED ORDER — ONDANSETRON 4 MG PO TBDP
4.0000 mg | ORAL_TABLET | Freq: Once | ORAL | Status: AC
  Administered 2011-10-09: 4 mg via ORAL
  Filled 2011-10-08: qty 1

## 2011-10-08 MED ORDER — GI COCKTAIL ~~LOC~~
30.0000 mL | Freq: Three times a day (TID) | ORAL | Status: DC | PRN
  Administered 2011-10-09: 30 mL via ORAL
  Filled 2011-10-08: qty 30

## 2011-10-08 NOTE — ED Provider Notes (Signed)
Kylie Hicks is a 25 y.o. female at 35.5 wks presenting via EMS for eval of multiple complaints. Having H/A, upper abd discomfort that radiates down her sides and nausea. Denies dysuria, vomiting, diarrhea/constipaton, fever, leak, bldg or ctx. Has appt at [facility] on 10/25 for removal of cerclage. History OB History    Grav Para Term Preterm Abortions TAB SAB Ect Mult Living   6 5 4 1      5      Past Medical History  Diagnosis Date  . Anemia     with pregnancies  . Incompetent cervix     cerclage  . History of preterm labor, current pregnancy   . Postpartum depression 2006  . Chlamydia   . Hypertension   . Thyroid disease     history hypothyroid, not on meds   Past Surgical History  Procedure Date  . Cervical cerclage   . Pilonidal cyst / sinus excision    Family History: family history includes Cancer in her sister and Hypertension in her mother. Social History:  does not have a smoking history on file. She does not have any smokeless tobacco history on file. Her alcohol and drug histories not on file.  Review of Systems  Genitourinary: Negative for dysuria.      Blood pressure 126/69, pulse 85, temperature 98 F (36.7 C), resp. rate 18, height 5\' 3"  (1.6 m), weight 103.874 kg (229 lb), unknown if currently breastfeeding. Maternal Exam:  Uterine Assessment: Contraction strength is mild.  Contraction frequency is rare.      Fetal Exam Fetal Monitor Review: Baseline rate: 130.  Pattern: accelerations present and no decelerations.       Physical Exam  Constitutional: She is oriented to person, place, and time. She appears well-developed and well-nourished.  HENT:  Head: Normocephalic.  Cardiovascular: Normal rate.   Respiratory: Effort normal.  Musculoskeletal: Normal range of motion.  Neurological: She is alert and oriented to person, place, and time.  Skin: Skin is warm and dry.  Psychiatric: She has a normal mood and affect.   Urine: tr hgb, ket 15, tr  leuk Prenatal labs: ABO, Rh: O/--/-- (06/28 0000) Antibody: Negative (06/28 0000) Rubella: Immune (06/28 0000) RPR: Nonreactive (06/28 0000)  HBsAg: Negative (06/28 0000)  HIV: Non-reactive (06/28 0000)  GBS:     Assessment/Plan: IUP at 35.5 GERD H/A  Feels better after Tylenol, Zofran and GI cocktail- symptoms have improved and is ready to go home. Urine to cult due to recent UTI with e coli. D/C home with F/U at 10/25 as sched or prn    SHAW, KIMBERLY 10/08/2011, 11:56 PM

## 2011-10-08 NOTE — Progress Notes (Signed)
Pt arrived via EMS with c/o upper abdominal pain and headaches  for a week.  No bleeding or discharge, no urinary difficulties.

## 2011-10-09 ENCOUNTER — Ambulatory Visit (HOSPITAL_COMMUNITY)
Admission: RE | Admit: 2011-10-09 | Discharge: 2011-10-09 | Disposition: A | Discharge status: Home / Self Care | Payer: Medicaid Other | Source: Ambulatory Visit | Attending: Physician Assistant | Admitting: Physician Assistant

## 2011-10-09 ENCOUNTER — Telehealth: Payer: Self-pay | Admitting: *Deleted

## 2011-10-09 DIAGNOSIS — Z8751 Personal history of pre-term labor: Secondary | ICD-10-CM | POA: Insufficient documentation

## 2011-10-09 DIAGNOSIS — O139 Gestational [pregnancy-induced] hypertension without significant proteinuria, unspecified trimester: Secondary | ICD-10-CM | POA: Insufficient documentation

## 2011-10-09 LAB — URINALYSIS, ROUTINE W REFLEX MICROSCOPIC
Glucose, UA: NEGATIVE mg/dL
Ketones, ur: 15 mg/dL — AB
Protein, ur: NEGATIVE mg/dL
pH: 6 (ref 5.0–8.0)

## 2011-10-09 LAB — URINE MICROSCOPIC-ADD ON

## 2011-10-09 NOTE — Telephone Encounter (Signed)
Pt left message yesterday stating that she has checked her BP and it is 140/98. She wants to know what to do to get it to go down. She also reported slight H/A and a feeling that her whole body is burning up. I checked her chart today and see that she came to [facility] for evaluation last evening and was d/c to home.

## 2011-10-11 ENCOUNTER — Ambulatory Visit (INDEPENDENT_AMBULATORY_CARE_PROVIDER_SITE_OTHER): Payer: Medicaid Other | Admitting: Family Medicine

## 2011-10-11 VITALS — BP 123/87 | Temp 97.7°F | Wt 232.7 lb

## 2011-10-11 DIAGNOSIS — O3430 Maternal care for cervical incompetence, unspecified trimester: Secondary | ICD-10-CM

## 2011-10-11 DIAGNOSIS — N883 Incompetence of cervix uteri: Secondary | ICD-10-CM

## 2011-10-11 DIAGNOSIS — O0990 Supervision of high risk pregnancy, unspecified, unspecified trimester: Secondary | ICD-10-CM

## 2011-10-11 LAB — POCT URINALYSIS DIP (DEVICE)
Bilirubin Urine: NEGATIVE
Glucose, UA: NEGATIVE mg/dL
Ketones, ur: NEGATIVE mg/dL
Specific Gravity, Urine: 1.025 (ref 1.005–1.030)

## 2011-10-11 MED ORDER — OXYCODONE-ACETAMINOPHEN 5-325 MG PO TABS: 1.0000 | ORAL_TABLET | ORAL | Status: DC | PRN

## 2011-10-11 NOTE — Progress Notes (Signed)
Nutrition Note:  Seen for 1st consult.  [facility] services.  Pt dx.- HTN, anemia, obesity, hx PPD, Thyroid disease. Current wt gain of 17#11oz plots within normal limits for [redacted]w[redacted]d gestation. Pt reports poor appetite due to nausea daily.  Reports 1 meal and 1-2 small snacks qd. Does drink a lot of milk (3-4 x qd).  Pt also reports that she is craving spicy foods and fears her BP is elevated. Plans to reduce sodium intake, increase intake of fruits and veggies and maintain fluid intake.  Pt reports planning to breastfeed. Follow up if referred. Cy Blamer, RD

## 2011-10-11 NOTE — Patient Instructions (Signed)
SEEK IMMEDIATE MEDICAL CARE IF:   You develop contractions that continue to become stronger, more regular, and closer together.   You have a gushing, burst or leaking of fluid from the vagina.   An oral temperature above 100.4  F (38.9 C) develops.   You have passage of blood-tinged mucus.   You develop vaginal bleeding.   You develop continuous belly (abdominal) pain.   You have low back pain that you never had before.   You feel the baby's head pushing down causing pelvic pressure.   The baby is not moving as much as it used to.  Document Released: 12/03/2005 Document Revised: 08/15/2011 Document Reviewed: 05/27/2009 [facility] Patient Information 2012 [place], [place].

## 2011-10-11 NOTE — Progress Notes (Signed)
Subjective:    Kylie Hicks is a 25 y.o. female being seen today for her obstetrical visit. She is at [redacted]w[redacted]d gestation. Patient reports lots of pelvic pressure and discomfort. Fetal movement: normal. Patient had a rescue cerclage placed in June and it was discussed that she should have it removed today. She denies LOF or vaginal bleeding. She is tearful today because of her discomfort, and she also states she has no support person. She declines SW today.  Menstrual History: OB History    Grav Para Term Preterm Abortions TAB SAB Ect Mult Living   6 5 4 1      5        Objective:    BP 123/87  Temp 97.7 F (36.5 C)  Wt 232 lb 11.2 oz (105.552 kg)  Breastfeeding? Unknown FHT:  150 BPM  Uterine Size: 37 cm  Presentation: cephalic   Speculum exam showed cervical os with mild irritation. Dr Penne Lash assisted with removal of cerclage, some spotting noted. Cervical exam after cerclage removed was 3/50/-2, BBOW palpated.  Assessment:    Pregnancy 36 and 1/7 weeks   Plan:    28-week labs reviewed, normal. Obtained GBS, GC/Ch today. Pediatrician: discussed. Infant feeding: plans to breastfeed. Desires BTL after delivery. States she signed papers earlier in her pregnancy. Follow up in 1 Week.   Discussed labor precautions. Provided percocet for pain control after cerclage removal and increase in pelvic pressure.

## 2011-10-11 NOTE — Progress Notes (Signed)
Feels pressure

## 2011-10-14 ENCOUNTER — Inpatient Hospital Stay (HOSPITAL_COMMUNITY)
Admission: AD | Admit: 2011-10-14 | Discharge: 2011-10-14 | Disposition: A | Discharge status: Home / Self Care | Payer: Medicaid Other | Source: Ambulatory Visit | Attending: Obstetrics & Gynecology | Admitting: Obstetrics & Gynecology

## 2011-10-14 ENCOUNTER — Encounter (HOSPITAL_COMMUNITY): Payer: Self-pay

## 2011-10-14 DIAGNOSIS — O479 False labor, unspecified: Secondary | ICD-10-CM

## 2011-10-14 DIAGNOSIS — O4700 False labor before 37 completed weeks of gestation, unspecified trimester: Secondary | ICD-10-CM | POA: Insufficient documentation

## 2011-10-14 DIAGNOSIS — M545 Low back pain, unspecified: Secondary | ICD-10-CM | POA: Insufficient documentation

## 2011-10-14 HISTORY — DX: Hypothyroidism, unspecified: E03.9

## 2011-10-14 LAB — URINALYSIS, ROUTINE W REFLEX MICROSCOPIC
Glucose, UA: NEGATIVE mg/dL
Specific Gravity, Urine: <1.005 — ABNORMAL LOW (ref 1.005–1.030)
pH: 6 (ref 5.0–8.0)

## 2011-10-14 LAB — URINE MICROSCOPIC-ADD ON

## 2011-10-14 MED ORDER — NALBUPHINE SYRINGE 5 MG/0.5 ML
10.0000 mg | INJECTION | Freq: Once | INTRAMUSCULAR | Status: AC
  Administered 2011-10-14: 10 mg via INTRAMUSCULAR
  Filled 2011-10-14: qty 1

## 2011-10-14 NOTE — ED Provider Notes (Signed)
Agree with above note.  Kylie Hicks H. 10/14/2011 8:30 PM

## 2011-10-14 NOTE — ED Notes (Signed)
Patient given two cups of apple juice has tolerating well.

## 2011-10-14 NOTE — ED Notes (Signed)
Patient staying in room to wait for her ride on the way to pick her up.

## 2011-10-14 NOTE — ED Provider Notes (Signed)
There has been no cervical change since pt admitted, pain is better. Pt desires d/c home.

## 2011-10-14 NOTE — ED Provider Notes (Signed)
Agree with above note.  LEGGETT,KELLY H. 10/14/2011 8:30 PM  

## 2011-10-14 NOTE — ED Notes (Signed)
Patient states her pain is better "hurts just a little bit" after receiving Nubain injection.

## 2011-10-14 NOTE — ED Provider Notes (Signed)
History   25 yo G6P4105 @ 36 4 in with c/o low back pain and ucs. States had stitch removed from cervix last week.  Chief Complaint  Patient presents with  . Back Pain  . Abdominal Pain  . Nausea   Back Pain Associated symptoms include abdominal pain.  Abdominal Pain    OB History    Grav Para Term Preterm Abortions TAB SAB Ect Mult Living   6 5 4 1      5       Past Medical History  Diagnosis Date  . Anemia     with pregnancies  . Incompetent cervix     cerclage  . History of preterm labor, current pregnancy   . Chlamydia   . Hypertension   . Thyroid disease     history hypothyroid, not on meds  . Hypothyroidism   . Postpartum depression 2006  . Incompetent cervix in pregnancy     Past Surgical History  Procedure Date  . Cervical cerclage   . Pilonidal cyst / sinus excision     Family History  Problem Relation Age of Onset  . Hypertension Mother   . Cancer Sister     History  Substance Use Topics  . Smoking status: Current Everyday Smoker -- 0.2 packs/day for 1 years    Types: Cigarettes  . Smokeless tobacco: Not on file  . Alcohol Use: No    Allergies: No Known Allergies  Prescriptions prior to admission  Medication Sig Dispense Refill  . oxyCODONE-acetaminophen (ROXICET) 5-325 MG per tablet Take 1 tablet by mouth every 4 (four) hours as needed for pain.  20 tablet  0  . pantoprazole (PROTONIX) 40 MG tablet Take 1 tablet (40 mg total) by mouth daily.  30 tablet  1  . prenatal vitamin w/FE, FA (PRENATAL 1 + 1) 27-1 MG TABS Take 1 tablet by mouth daily.          Review of Systems  Constitutional: Negative.   HENT: Negative.   Eyes: Negative.   Respiratory: Negative.   Cardiovascular: Negative.   Gastrointestinal: Positive for abdominal pain.  Musculoskeletal: Positive for back pain.  Skin: Negative.    Physical Exam   Blood pressure 129/83, pulse 83, temperature 98.6 F (37 C), temperature source Oral, resp. rate 16, unknown if currently  breastfeeding.  Physical Exam  Constitutional: She is oriented to person, place, and time. She appears well-developed and well-nourished.  Cardiovascular: Normal rate, regular rhythm, normal heart sounds and intact distal pulses.   GI: Soft. Bowel sounds are normal.  Genitourinary: Vagina normal.  Neurological: She is alert and oriented to person, place, and time. She has normal reflexes.  Skin: Skin is warm and dry.  Psychiatric: She has a normal mood and affect. Her behavior is normal. Judgment and thought content normal.    MAU Course  Procedures  MDM   Assessment and Plan  Mild ucs 5-7 min apart. Stable maternal fetal unit. Will therapeutic rest and revaluate in 1 hr if no cervical change d/c home.  Zerita Boers 10/14/2011, 1:25 PM

## 2011-10-18 ENCOUNTER — Inpatient Hospital Stay (HOSPITAL_COMMUNITY)
Admission: AD | Admit: 2011-10-18 | Discharge: 2011-10-18 | Disposition: A | Discharge status: Home / Self Care | Payer: Medicaid Other | Source: Ambulatory Visit | Attending: Obstetrics & Gynecology | Admitting: Obstetrics & Gynecology

## 2011-10-18 ENCOUNTER — Encounter (HOSPITAL_COMMUNITY): Payer: Self-pay

## 2011-10-18 ENCOUNTER — Inpatient Hospital Stay (HOSPITAL_COMMUNITY)
Admission: AD | Admit: 2011-10-18 | Discharge: 2011-10-21 | DRG: 775 | Disposition: A | Discharge status: Home / Self Care | Payer: Medicaid Other | Source: Ambulatory Visit | Attending: Obstetrics & Gynecology | Admitting: Obstetrics & Gynecology

## 2011-10-18 ENCOUNTER — Encounter (HOSPITAL_COMMUNITY): Payer: Self-pay | Admitting: *Deleted

## 2011-10-18 ENCOUNTER — Ambulatory Visit (INDEPENDENT_AMBULATORY_CARE_PROVIDER_SITE_OTHER): Payer: Medicaid Other | Admitting: Obstetrics & Gynecology

## 2011-10-18 VITALS — BP 120/77 | Wt 230.3 lb

## 2011-10-18 DIAGNOSIS — O99284 Endocrine, nutritional and metabolic diseases complicating childbirth: Principal | ICD-10-CM | POA: Diagnosis present

## 2011-10-18 DIAGNOSIS — O479 False labor, unspecified: Secondary | ICD-10-CM | POA: Insufficient documentation

## 2011-10-18 DIAGNOSIS — O3430 Maternal care for cervical incompetence, unspecified trimester: Secondary | ICD-10-CM | POA: Diagnosis present

## 2011-10-18 DIAGNOSIS — E079 Disorder of thyroid, unspecified: Principal | ICD-10-CM | POA: Diagnosis present

## 2011-10-18 DIAGNOSIS — N883 Incompetence of cervix uteri: Secondary | ICD-10-CM

## 2011-10-18 DIAGNOSIS — E039 Hypothyroidism, unspecified: Secondary | ICD-10-CM | POA: Diagnosis present

## 2011-10-18 DIAGNOSIS — O09219 Supervision of pregnancy with history of pre-term labor, unspecified trimester: Secondary | ICD-10-CM

## 2011-10-18 LAB — POCT URINALYSIS DIP (DEVICE)
Specific Gravity, Urine: 1.025 (ref 1.005–1.030)
pH: 6 (ref 5.0–8.0)

## 2011-10-18 LAB — WET PREP, GENITAL

## 2011-10-18 MED ORDER — CEPHALEXIN 500 MG PO CAPS: 500.0000 mg | ORAL_CAPSULE | Freq: Two times a day (BID) | ORAL | Status: DC

## 2011-10-18 NOTE — Assessment & Plan Note (Signed)
S/p cerclage and cerclage removal

## 2011-10-18 NOTE — Progress Notes (Signed)
Pressure-vaginal. Vaginal discharge-clear mucous. Pulse-68 Pt declines the TDap vaccine.

## 2011-10-18 NOTE — Progress Notes (Signed)
Report called to RN at [facility]U. 

## 2011-10-18 NOTE — ED Provider Notes (Signed)
Chief Complaint:  No chief complaint on file.   Kylie Hicks is  25 y.o. [phone] at [redacted]w[redacted]d presents complaining of No chief complaint on file. .  She states irregular, every 3-5 minutes contractions associated with minimal vaginal bleeding,? ruptured membranes, along with active fetal movement. She was seen earlier in clinic today and was noted to be 4-5cms dilated.  Sent to [facility]U for evaluation, and since cx did not change, she was discharged.  Pt states that she has started having painful contractions, and has liquid running down her leg  Obstetrical/Gynecological History: Menstrual History: OB History    Grav Para Term Preterm Abortions TAB SAB Ect Mult Living   6 5 4 1      5       Patient's last menstrual period was 01/30/2011.     Past Medical History: Past Medical History  Diagnosis Date  . Anemia     with pregnancies  . Incompetent cervix     cerclage  . History of preterm labor, current pregnancy   . Chlamydia   . Thyroid disease     history hypothyroid, not on meds  . Hypothyroidism   . Postpartum depression 2006  . Incompetent cervix in pregnancy   . Hypertension     no meds    Past Surgical History: Past Surgical History  Procedure Date  . Cervical cerclage   . Pilonidal cyst / sinus excision     Family History: Family History  Problem Relation Age of Onset  . Hypertension Mother   . Cancer Sister     Social History: History  Substance Use Topics  . Smoking status: Current Everyday Smoker -- 0.2 packs/day for 3 years    Types: Cigarettes  . Smokeless tobacco: Not on file  . Alcohol Use: No    Allergies: No Known Allergies  Meds:  Prescriptions prior to admission  Medication Sig Dispense Refill  . acetaminophen (TYLENOL) 325 MG tablet Take 650 mg by mouth every 6 (six) hours as needed. For pain and headaches.       Marland Kitchen oxyCODONE-acetaminophen (PERCOCET) 5-325 MG per tablet Take 1 tablet by mouth every 4 (four) hours as needed. Pain        .  pantoprazole (PROTONIX) 40 MG tablet Take 1 tablet (40 mg total) by mouth daily.  30 tablet  1  . prenatal vitamin w/FE, FA (PRENATAL 1 + 1) 27-1 MG TABS Take 1 tablet by mouth daily.        Marland Kitchen DISCONTD: oxyCODONE-acetaminophen (ROXICET) 5-325 MG per tablet Take 1 tablet by mouth every 4 (four) hours as needed for pain.  20 tablet  0  . cephALEXin (KEFLEX) 500 MG capsule Take 500 mg by mouth 2 (two) times daily. Didn't get filled       . DISCONTD: cephALEXin (KEFLEX) 500 MG capsule Take 1 capsule (500 mg total) by mouth 2 (two) times daily.  14 capsule  0    Review of Systems - Please refer to the aforementioned patients' reports.     Physical Exam  Blood pressure 121/78, pulse 75, temperature 98.3 F (36.8 C), resp. rate 20, height 5\' 3"  (1.6 m), weight 104.327 kg (230 lb), last menstrual period 01/30/2011, unknown if currently breastfeeding. GENERAL: Well-developed, well-nourished female in no acute distress.  LUNGS: Clear to auscultation bilaterally.  HEART: Regular rate and rhythm. ABDOMEN: Soft, nontender, nondistended, gravid.  EXTREMITIES: Nontender, no edema, 2+ distal pulses. Cervical Exam: Dilatation 4-5cm   Effacement 50%   Station -2  Presentation: cephalic FHT:  Baseline rate 140 bpm   Variability moderate  Accelerations present   Decelerations none Contractions: Every 4-6 mins SSE:  Very liquidy, grey, blood tinged discharge pooling behind cervix and running out of vagina. Fern negative, but still suspicious for ROM, so amnisure sent, along with a wet prep   Labs: Recent Results (from the past 24 hour(s))  POCT URINALYSIS DIP (DEVICE)   Collection Time   10/18/11  9:50 AM      Component Value Range   Glucose, UA NEGATIVE  NEGATIVE (mg/dL)   Bilirubin Urine SMALL (*) NEGATIVE    Ketones, ur TRACE (*) NEGATIVE (mg/dL)   Specific Gravity, Urine 1.025  1.005 - 1.030    Hgb urine dipstick MODERATE (*) NEGATIVE    pH 6.0  5.0 - 8.0    Protein, ur NEGATIVE  NEGATIVE (mg/dL)     Urobilinogen, UA 1.0  0.0 - 1.0 (mg/dL)   Nitrite NEGATIVE  NEGATIVE    Leukocytes, UA SMALL (*) NEGATIVE    Imaging Studies:  US Ob Follow Up  10/09/2011  OBSTETRICAL ULTRASOUND: This exam was performed within a [facility]. The OB US report was generated in the [facility] system, and faxed to the ordering physician.   This report is also available in [employer] and in the [employer]. See [facility] Obstetric US report.    Assessment: Kylie Hicks is  25 y.o. [phone] at [redacted]w[redacted]d presents with ? ROM/ early labor.  Plan: Labs, observe for labor.  CRESENZO-DISHMAN,Rickia [place] 11/1/20129:01 PM

## 2011-10-18 NOTE — Plan of Care (Signed)
Lab called and indicated they had no urine in the lab so are unable to do the urine culture that was ordered

## 2011-10-18 NOTE — Progress Notes (Signed)
Blood in urine, pt has symptoms of burning with urination.  Will send culture.  Pt going to MAU for labor eval.  Wants BTL.

## 2011-10-18 NOTE — ED Provider Notes (Signed)
History     Chief Complaint  Patient presents with  . Contractions   HPI  Pt here sent over from antenatal clinic by Dr. Penne Lash due to cervix being 4cm and floppy; denies vaginal bleeding or leaking of fluid.  Reports contractions have not changed in intensity.  Cerclage removed on 10/11/11 and cervix was 3cm at that check.  Past Medical History  Diagnosis Date  . Anemia     with pregnancies  . Incompetent cervix     cerclage  . History of preterm labor, current pregnancy   . Chlamydia   . Thyroid disease     history hypothyroid, not on meds  . Hypothyroidism   . Postpartum depression 2006  . Incompetent cervix in pregnancy   . Hypertension     no meds    Past Surgical History  Procedure Date  . Cervical cerclage   . Pilonidal cyst / sinus excision     Family History  Problem Relation Age of Onset  . Hypertension Mother   . Cancer Sister     History  Substance Use Topics  . Smoking status: Current Everyday Smoker -- 0.2 packs/day for 1 years    Types: Cigarettes  . Smokeless tobacco: Not on file  . Alcohol Use: No    Allergies: No Known Allergies  No prescriptions prior to admission    Review of Systems  Gastrointestinal: Positive for abdominal pain (contractions).   Physical Exam   Blood pressure 134/69, pulse 76, temperature 99.2 F (37.3 C), temperature source Oral, resp. rate 16, height 5\' 4"  (1.626 m), weight 105.507 kg (232 lb 9.6 oz), SpO2 96.00%, unknown if currently breastfeeding.  Physical Exam  Constitutional: She is oriented to person, place, and time. She appears well-developed and well-nourished.  HENT:  Head: Normocephalic.  Neck: Normal range of motion. Neck supple.  Cardiovascular: Normal rate, regular rhythm and normal heart sounds.   Respiratory: Effort normal and breath sounds normal.  GI: Soft. There is no tenderness.  Genitourinary: No bleeding around the vagina. Vaginal discharge (mucusy) found.       Cervix 4cm, loose/50    Neurological: She is alert and oriented to person, place, and time.  Skin: Skin is warm and dry.  FHR 130's, +accels, reactive Toco - 3-4/50-60   MAU Course  Procedures UA small leuk   Assessment and Plan  Early Labor Category I FHR Tracing  Plan: DC to home Reviewed signs of preterm labor  [facility] 10/18/2011, 1:41 PM

## 2011-10-18 NOTE — Progress Notes (Signed)
Patient states she was at the [facility] for a routine visit and her cervix was 4cm. Sent to MAU for evaluation for labor. Patient states she had a cerclage removed last week. No bleeding or leaking Good fetal movement.

## 2011-10-19 ENCOUNTER — Encounter (HOSPITAL_COMMUNITY): Payer: Self-pay | Admitting: *Deleted

## 2011-10-19 LAB — CBC
HCT: 29.7 % — ABNORMAL LOW (ref 36.0–46.0)
RDW: 14.5 % (ref 11.5–15.5)
WBC: 12.5 10*3/uL — ABNORMAL HIGH (ref 4.0–10.5)

## 2011-10-19 LAB — TYPE AND SCREEN
ABO/RH(D): O POS
Antibody Screen: NEGATIVE

## 2011-10-19 MED ORDER — BENZOCAINE-MENTHOL 20-0.5 % EX AERO
INHALATION_SPRAY | CUTANEOUS | Status: AC
  Filled 2011-10-19: qty 56

## 2011-10-19 MED ORDER — OXYCODONE-ACETAMINOPHEN 5-325 MG PO TABS
2.0000 | ORAL_TABLET | ORAL | Status: DC | PRN
  Administered 2011-10-19: 1 via ORAL
  Filled 2011-10-19: qty 1

## 2011-10-19 MED ORDER — EPHEDRINE 5 MG/ML INJ
10.0000 mg | INTRAVENOUS | Status: DC | PRN
  Filled 2011-10-19 (×2): qty 4

## 2011-10-19 MED ORDER — CEPHALEXIN 500 MG PO CAPS
500.0000 mg | ORAL_CAPSULE | Freq: Two times a day (BID) | ORAL | Status: DC
  Administered 2011-10-19 – 2011-10-20 (×2): 500 mg via ORAL
  Filled 2011-10-19 (×5): qty 1

## 2011-10-19 MED ORDER — LIDOCAINE HCL (PF) 1 % IJ SOLN
30.0000 mL | INTRAMUSCULAR | Status: DC | PRN
  Filled 2011-10-19 (×2): qty 30

## 2011-10-19 MED ORDER — EPHEDRINE 5 MG/ML INJ
10.0000 mg | INTRAVENOUS | Status: DC | PRN
  Filled 2011-10-19: qty 4

## 2011-10-19 MED ORDER — ACETAMINOPHEN 325 MG PO TABS: 650.0000 mg | ORAL_TABLET | ORAL | Status: DC | PRN

## 2011-10-19 MED ORDER — OXYCODONE-ACETAMINOPHEN 5-325 MG PO TABS
1.0000 | ORAL_TABLET | ORAL | Status: DC | PRN
  Administered 2011-10-19 – 2011-10-20 (×3): 1 via ORAL
  Administered 2011-10-21: 2 via ORAL
  Administered 2011-10-21: 1 via ORAL
  Administered 2011-10-21: 2 via ORAL
  Filled 2011-10-19: qty 1
  Filled 2011-10-19: qty 2
  Filled 2011-10-19: qty 1
  Filled 2011-10-19: qty 2
  Filled 2011-10-19 (×2): qty 1

## 2011-10-19 MED ORDER — LACTATED RINGERS IV SOLN: 500.0000 mL | Freq: Once | INTRAVENOUS | Status: DC

## 2011-10-19 MED ORDER — TETANUS-DIPHTH-ACELL PERTUSSIS 5-2.5-18.5 LF-MCG/0.5 IM SUSP: 0.5000 mL | Freq: Once | INTRAMUSCULAR | Status: DC

## 2011-10-19 MED ORDER — OXYTOCIN BOLUS FROM INFUSION
500.0000 mL | Freq: Once | INTRAVENOUS | Status: DC
  Filled 2011-10-19: qty 500

## 2011-10-19 MED ORDER — SENNOSIDES-DOCUSATE SODIUM 8.6-50 MG PO TABS
2.0000 | ORAL_TABLET | Freq: Every day | ORAL | Status: DC
  Administered 2011-10-19: 2 via ORAL

## 2011-10-19 MED ORDER — PHENYLEPHRINE 40 MCG/ML (10ML) SYRINGE FOR IV PUSH (FOR BLOOD PRESSURE SUPPORT)
80.0000 ug | PREFILLED_SYRINGE | INTRAVENOUS | Status: DC | PRN
  Filled 2011-10-19 (×2): qty 5

## 2011-10-19 MED ORDER — ONDANSETRON HCL 4 MG/2ML IJ SOLN: 4.0000 mg | Freq: Four times a day (QID) | INTRAMUSCULAR | Status: DC | PRN

## 2011-10-19 MED ORDER — DIPHENHYDRAMINE HCL 50 MG/ML IJ SOLN: 12.5000 mg | INTRAMUSCULAR | Status: DC | PRN

## 2011-10-19 MED ORDER — LACTATED RINGERS IV SOLN
INTRAVENOUS | Status: DC
  Administered 2011-10-19: 125 mL/h via INTRAVENOUS

## 2011-10-19 MED ORDER — NALBUPHINE SYRINGE 5 MG/0.5 ML
5.0000 mg | INJECTION | INTRAMUSCULAR | Status: DC | PRN
  Administered 2011-10-19: 5 mg via INTRAVENOUS
  Filled 2011-10-19 (×2): qty 0.5

## 2011-10-19 MED ORDER — WITCH HAZEL-GLYCERIN EX PADS: 1.0000 "application " | MEDICATED_PAD | CUTANEOUS | Status: DC | PRN

## 2011-10-19 MED ORDER — IBUPROFEN 600 MG PO TABS
600.0000 mg | ORAL_TABLET | Freq: Four times a day (QID) | ORAL | Status: DC
  Administered 2011-10-19 – 2011-10-21 (×8): 600 mg via ORAL
  Filled 2011-10-19 (×7): qty 1

## 2011-10-19 MED ORDER — CITRIC ACID-SODIUM CITRATE 334-500 MG/5ML PO SOLN: 30.0000 mL | ORAL | Status: DC | PRN

## 2011-10-19 MED ORDER — LANOLIN HYDROUS EX OINT: TOPICAL_OINTMENT | CUTANEOUS | Status: DC | PRN

## 2011-10-19 MED ORDER — FLEET ENEMA 7-19 GM/118ML RE ENEM: 1.0000 | ENEMA | RECTAL | Status: DC | PRN

## 2011-10-19 MED ORDER — HYDROXYZINE HCL 50 MG/ML IM SOLN
50.0000 mg | Freq: Four times a day (QID) | INTRAMUSCULAR | Status: DC | PRN
  Filled 2011-10-19: qty 1

## 2011-10-19 MED ORDER — BENZOCAINE-MENTHOL 20-0.5 % EX AERO
1.0000 "application " | INHALATION_SPRAY | CUTANEOUS | Status: DC | PRN
  Administered 2011-10-20: 1 via TOPICAL

## 2011-10-19 MED ORDER — FENTANYL 2.5 MCG/ML BUPIVACAINE 1/10 % EPIDURAL INFUSION (WH - ANES)
14.0000 mL/h | INTRAMUSCULAR | Status: DC
  Filled 2011-10-19: qty 60

## 2011-10-19 MED ORDER — ONDANSETRON HCL 4 MG PO TABS: 4.0000 mg | ORAL_TABLET | ORAL | Status: DC | PRN

## 2011-10-19 MED ORDER — DIBUCAINE 1 % RE OINT: 1.0000 "application " | TOPICAL_OINTMENT | RECTAL | Status: DC | PRN

## 2011-10-19 MED ORDER — PRENATAL PLUS 27-1 MG PO TABS
1.0000 | ORAL_TABLET | Freq: Every day | ORAL | Status: DC
  Administered 2011-10-19 – 2011-10-21 (×3): 1 via ORAL
  Filled 2011-10-19 (×3): qty 1

## 2011-10-19 MED ORDER — DIPHENHYDRAMINE HCL 25 MG PO CAPS: 25.0000 mg | ORAL_CAPSULE | Freq: Four times a day (QID) | ORAL | Status: DC | PRN

## 2011-10-19 MED ORDER — OXYTOCIN 20 UNITS IN LACTATED RINGERS INFUSION - SIMPLE
125.0000 mL/h | Freq: Once | INTRAVENOUS | Status: AC
  Administered 2011-10-19: 125 mL/h via INTRAVENOUS
  Filled 2011-10-19: qty 1000

## 2011-10-19 MED ORDER — ONDANSETRON HCL 4 MG/2ML IJ SOLN: 4.0000 mg | INTRAMUSCULAR | Status: DC | PRN

## 2011-10-19 MED ORDER — HYDROXYZINE HCL 50 MG PO TABS
50.0000 mg | ORAL_TABLET | Freq: Four times a day (QID) | ORAL | Status: DC | PRN
  Filled 2011-10-19: qty 1

## 2011-10-19 MED ORDER — IBUPROFEN 600 MG PO TABS
600.0000 mg | ORAL_TABLET | Freq: Four times a day (QID) | ORAL | Status: DC | PRN
  Administered 2011-10-19: 600 mg via ORAL
  Filled 2011-10-19: qty 1

## 2011-10-19 MED ORDER — MEASLES, MUMPS & RUBELLA VAC ~~LOC~~ INJ
0.5000 mL | INJECTION | Freq: Once | SUBCUTANEOUS | Status: DC
  Filled 2011-10-19: qty 0.5

## 2011-10-19 MED ORDER — MISOPROSTOL 200 MCG PO TABS
ORAL_TABLET | ORAL | Status: AC
  Administered 2011-10-19: 1000 ug via RECTAL
  Filled 2011-10-19: qty 5

## 2011-10-19 MED ORDER — LACTATED RINGERS IV SOLN
500.0000 mL | INTRAVENOUS | Status: DC | PRN
Start: 2011-10-19 — End: 2011-10-19
  Administered 2011-10-19: 400 mL via INTRAVENOUS

## 2011-10-19 MED ORDER — SIMETHICONE 80 MG PO CHEW: 80.0000 mg | CHEWABLE_TABLET | ORAL | Status: DC | PRN

## 2011-10-19 MED ORDER — ZOLPIDEM TARTRATE 5 MG PO TABS: 5.0000 mg | ORAL_TABLET | Freq: Every evening | ORAL | Status: DC | PRN

## 2011-10-19 NOTE — Progress Notes (Signed)
PT c/o severe pressure.  CX 8/100/0.  CTX q 2 minutes,strong.  FHR 130's, reactive with out decels.  Anticipate SVD soon

## 2011-10-19 NOTE — Progress Notes (Signed)
   Kylie Hicks is a 25 y.o. [phone] at [redacted]w[redacted]d  admitted for active labor  Subjective:  contrations "still feel strong" Objective: BP 129/74  Pulse 73  Temp(Src) 98.2 F (36.8 C) (Oral)  Resp 20  Ht 5\' 3"  (1.6 m)  Wt 104.327 kg (230 lb)  BMI 40.74 kg/m2  LMP 01/30/2011  Breastfeeding? Unknown    FHT:  FHR: 130 bpm, variability: moderate,  accelerations:  Present,  decelerations:  Absent UC:   irregular, every 3-5 minutes SVE:   Dilation: 6.5 Effacement (%): 70 Station: -2 Exam by:: F Crescenzo-Dishmon CNM BBOW: AROM with clear fluid  Labs: Lab Results  Component Value Date   WBC 12.5* 10/19/2011   HGB 9.7* 10/19/2011   HCT 29.7* 10/19/2011   MCV 80.3 10/19/2011   PLT 262 10/19/2011    Assessment / Plan: Protracted latent phase  Labor: protracted latent phase Fetal Wellbeing:  Category I Pain Control:  Labor support without medications Anticipated MOD:  NSVD  CRESENZO-DISHMAN,Dontre Laduca 10/19/2011, 7:21 AM

## 2011-10-19 NOTE — Progress Notes (Signed)
   JENAN ELLEGOOD is a 25 y.o. [phone] at [redacted]w[redacted]d  admitted for active labor  Subjective: C/o painful contractions   Objective: BP 129/74  Pulse 73  Temp(Src) 98.2 F (36.8 C) (Oral)  Resp 20  Ht 5\' 3"  (1.6 m)  Wt 104.327 kg (230 lb)  BMI 40.74 kg/m2  LMP 01/30/2011  Breastfeeding? Unknown    FHT:  FHR: 130 bpm, variability: moderate,  accelerations:  Present,  decelerations:  Absent UC:   irregular, every 3-6 minutes SVE:   6/50/-2   Labs: Lab Results  Component Value Date   WBC 12.5* 10/19/2011   HGB 9.7* 10/19/2011   HCT 29.7* 10/19/2011   MCV 80.3 10/19/2011   PLT 262 10/19/2011    Assessment / Plan: Protracted latent phase  Labor: protracted latent phase Fetal Wellbeing:  Category I Pain Control:  Labor support without medications Anticipated MOD:  NSVD  CRESENZO-DISHMAN,Elliette Seabolt 10/19/2011, 7:19 AM

## 2011-10-19 NOTE — H&P (Signed)
Kylie Hicks is a 25 y.o. female [phone] with IUP at [redacted]w[redacted]d presenting for labor. Pt states she has been having irregular, every 3-5 minutes, associated with spotting vaginal bleeding, intact, with active.   [facility] at [facility] wks  Prenatal History/Complications:  Past Medical History: Past Medical History  Diagnosis Date  . Anemia     with pregnancies  . Incompetent cervix     cerclage  . History of preterm labor, current pregnancy   . Chlamydia   . Thyroid disease     history hypothyroid, not on meds  . Hypothyroidism   . Postpartum depression 2006  . Incompetent cervix in pregnancy   . Hypertension     no meds    Past Surgical History: Past Surgical History  Procedure Date  . Cervical cerclage   . Pilonidal cyst / sinus excision     Obstetrical History: OB History    Grav Para Term Preterm Abortions TAB SAB Ect Mult Living   6 5 4 1      5       Gynecological History: OB History    Grav Para Term Preterm Abortions TAB SAB Ect Mult Living   6 5 4 1      5       Social History: History   Social History  . Marital Status: Single    Spouse Name: N/A    Number of Children: N/A  . Years of Education: N/A   Social History Main Topics  . Smoking status: Current Everyday Smoker -- 0.2 packs/day for 3 years    Types: Cigarettes  . Smokeless tobacco: None  . Alcohol Use: No  . Drug Use: No  . Sexually Active: Not Currently   Other Topics Concern  . None   Social History Narrative  . None    Family History: Family History  Problem Relation Age of Onset  . Hypertension Mother   . Cancer Sister     Allergies: No Known Allergies  Prescriptions prior to admission  Medication Sig Dispense Refill  . acetaminophen (TYLENOL) 325 MG tablet Take 650 mg by mouth every 6 (six) hours as needed. For pain and headaches.       Marland Kitchen oxyCODONE-acetaminophen (PERCOCET) 5-325 MG per tablet Take 1 tablet by mouth every 4 (four) hours as needed. Pain        .  pantoprazole (PROTONIX) 40 MG tablet Take 1 tablet (40 mg total) by mouth daily.  30 tablet  1  . prenatal vitamin w/FE, FA (PRENATAL 1 + 1) 27-1 MG TABS Take 1 tablet by mouth daily.        Marland Kitchen DISCONTD: oxyCODONE-acetaminophen (ROXICET) 5-325 MG per tablet Take 1 tablet by mouth every 4 (four) hours as needed for pain.  20 tablet  0  . cephALEXin (KEFLEX) 500 MG capsule Take 500 mg by mouth 2 (two) times daily. Didn't get filled       . DISCONTD: cephALEXin (KEFLEX) 500 MG capsule Take 1 capsule (500 mg total) by mouth 2 (two) times daily.  14 capsule  0    Review of Systems - Negative    Blood pressure 119/64, pulse 86, temperature 98.3 F (36.8 C), resp. rate 18, height 5\' 3"  (1.6 m), weight 104.327 kg (230 lb), last menstrual period 01/30/2011, unknown if currently breastfeeding. General appearance: alert, cooperative and mild distress Lungs: clear to auscultation bilaterally Heart: regular rate and rhythm Abdomen: soft, non-tender; bowel sounds normal Pelvic: 6/50/-2 Extremities: Homans sign is negative,  no sign of DVT DTR's 2+ Presentation: cephalic Baseline: 140 bpm Contrqctions q 2-4 minutes, mild to mod   Prenatal labs: ABO, Rh: O/--/-- (06/28 0000) Antibody: Negative (06/28 0000) Rubella:  immune RPR: Nonreactive (06/28 0000)  HBsAg: Negative (06/28 0000)  HIV: Non-reactive (06/28 0000)  GBS:   negative 1 hr Glucola 106 Genetic screening  wnl Anatomy US wnl   Assessment: Kylie Hicks is a 25 y.o. [phone] with an IUP at [redacted]w[redacted]d presenting for labor  Plan: Expectant management   CRESENZO-DISHMAN,Yohannes Waibel 10/19/2011, 12:28 AM

## 2011-10-19 NOTE — Progress Notes (Signed)
UR chart review completed.  

## 2011-10-20 LAB — CULTURE, OB URINE

## 2011-10-20 MED ORDER — BENZOCAINE-MENTHOL 20-0.5 % EX AERO
INHALATION_SPRAY | CUTANEOUS | Status: AC
  Administered 2011-10-20: 1 via TOPICAL
  Filled 2011-10-20: qty 56

## 2011-10-20 MED ORDER — IBUPROFEN 600 MG PO TABS: 600.0000 mg | ORAL_TABLET | Freq: Four times a day (QID) | ORAL | Status: AC

## 2011-10-20 NOTE — Progress Notes (Signed)
Post Partum Day 1  Subjective: no complaints, up ad lib, voiding and tolerating PO  Objective: Blood pressure 111/75, pulse 67, temperature 98.2 F (36.8 C), temperature source Oral, resp. rate 24, height 5\' 3"  (1.6 m), weight 104.327 kg (230 lb), last menstrual period 01/30/2011, SpO2 100.00%, unknown if currently breastfeeding.  Physical Exam:  General: alert, cooperative and no distress Lochia: appropriate Uterine Fundus: firm    Basename 10/19/11 0050  HGB 9.7*  HCT 29.7*    Assessment/Plan: Discharge home and Contraception :plans on BTL, will have clinic call to scheduled 2 week PP visit to make arrangements for tubal, has signed papers   LOS: 2 days   FRAZIER,NATALIE 10/20/2011, 7:22 AM

## 2011-10-20 NOTE — Discharge Summary (Signed)
Obstetric Discharge Summary Reason for Admission: onset of labor Prenatal Procedures: none Intrapartum Procedures: spontaneous vaginal delivery Postpartum Procedures: none Complications-Operative and Postpartum: none Hemoglobin  Date Value Range Status  10/19/2011 9.7* 12.0-15.0 (g/dL) Final     HCT  Date Value Range Status  10/19/2011 29.7* 36.0-46.0 (%) Final    Discharge Diagnoses: Term Pregnancy-delivered  Discharge Information: Date: 10/20/2011 Activity: pelvic rest Diet: routine Medications: Ibuprofen Condition: stable Instructions: routine Discharge to: home Follow-up Information    [facility] with [facility]. Make an appointment in 2 weeks.   Contact information:   [address] [place] [address] [phone]         Newborn Data: Live born female  Birth Weight: 6 lb 8.2 oz (2955 g) APGAR: 9, 9  Home with mother.  Kylie Hicks 10/20/2011, 7:26 AM

## 2011-10-20 NOTE — Progress Notes (Signed)
PSYCHOSOCIAL ASSESSMENT ~ MATERNAL/CHILD Name:  Kylie Hicks        Age: 27 day    Referral Date : 10/19/2011   Reason/Source:  Hx of depression/CN  I. FAMILY/HOME ENVIRONMENT A. Child's Legal Guardian X Parent(s)  Name: Kylie Hicks  DOB: Feb 06, 1986   Age: 58 Address :  [address] [place], [place] [address]  [address] Name: [name]   Address:  same B. Other Household Members/Support Persons   Sister age 57   Sister age 39   Brother age 73   Brother age 57   Brother age 55   Maternal aunt   Maternal cousin  C.   Other Support   Friends and family  II. PSYCHOSOCIAL DATA A. Information Source X  Patient Interview    B  Surveyor, quantity and Community Resources X  Employment  -MOB -[employer]  X  Medicaid     County: [place]       X Food Stamps     X WIC     C. Cultural and Environment Information Cultural Issues Impacting Care:  N/A  III. STRENGTHS X Supportive family/friends  X Adequate Resources X Compliance with medical plan  X Home prepared for Child (including basic supplies)                X Understanding of illness           X Other:  Pediatrician- [facility]   IV. RISK FACTORS AND CURRENT PROBLEMS                    X No Problems Noted                         V. SOCIAL WORK ASSESSMENT LCSW met with MOB at bedside to assess strengths and needs related to referral for history of depression.  MOB reports being happy her baby has arrived.  This is her last child.  She opted to try and natural birth since all her other children were assisted with epidural pain management.  MOB reports the pregnancy was good, and the delivery was challenged with pain.  She acknowledges it being a different birth experience from what she had been used to, but she is pleased with her choice.  MOB understands that baby is reportedly being kept as a baby patient to support observation and management for jaundice.  Although she is ready to go home, she is understanding of the  importance of quality care for the health of her baby.  MOB reports her overall mood and affect as good.  She did not have any problems during her pregnancy.  She expects to have a normal postpartum course.  She was affected by postpartum depression a couple of years ago, and was helped with medication management.  She understands she can again utilize this strategy of support if needed.  FOB lives in the home and is the father of all mom's children.  Mom's sister and son also live in the home.  She reports having very good support system.  The 82, 59, and 27 year old boys are in school, and mom stays with the girls during the day.  MOB expects to return to work in a couple of months during 3rd shift.  Family will assist with child care.  MOB reported being happy and overall feeling well.  Her mood and affect were congruent.  She feels her care has been good,  and she does not have any expressed concerns or needs at this time.  MOB regards baby very warmly and is attentive to baby's needs.   VI. SOCIAL WORK PLAN X No Further Intervention Required/No Barriers to Discharge X Patient/Family Education:  Postpartum depression/Feelings After Birth brochure provided  Staci Acosta, LCSW  10/20/2011, 4:46 pm

## 2011-10-21 NOTE — Progress Notes (Signed)
Post Partum Day 2 for NSVD  Subjective: no complaints, up ad lib, voiding, tolerating PO and + flatus  Objective: Temp:  [98.4 F (36.9 C)-98.6 F (37 C)] 98.4 F (36.9 C) (11/04 0525) Pulse Rate:  [72-84] 72  (11/04 0525) Resp:  [20] 20  (11/04 0525) BP: (118-131)/(70-81) 118/74 mmHg (11/04 0525)   Physical Exam:  General: alert, cooperative, appears older than stated age and mildly obese Lochia: appropriate Uterine Fundus: firm DVT Evaluation: No evidence of DVT seen on physical exam.   Basename 10/19/11 0050  HGB 9.7*  HCT 29.7*    Assessment/Plan: Discharge home   LOS: 3 days   Cassaundra Rasch 10/21/2011, 8:46 AM

## 2011-10-22 NOTE — H&P (Signed)
Attestation of Attending Supervision of Advanced Practitioner: Evaluation and management procedures were performed by the PA/NP/CNM/OB Fellow under my supervision/collaboration. Chart reviewed, and agree with management and plan.  Aneliz Carbary A M.D. 10/22/2011 11:10 AM    

## 2011-10-22 NOTE — ED Provider Notes (Signed)
Attestation of Attending Supervision of Advanced Practitioner: Evaluation and management procedures were performed by the PA/NP/CNM/OB Fellow under my supervision/collaboration. Chart reviewed, and agree with management and plan.  ANYANWU,UGONNA A M.D. 10/22/2011 11:10 AM    

## 2011-10-26 NOTE — Discharge Summary (Signed)
Obstetric Discharge Summary Reason for Admission: onset of labor Prenatal Procedures: none Intrapartum Procedures: spontaneous vaginal delivery Postpartum Procedures: none Complications-Operative and Postpartum: none Hemoglobin  Date Value Range Status  10/19/2011 9.7* 12.0-15.0 (g/dL) Final     HCT  Date Value Range Status  10/19/2011 29.7* 36.0-46.0 (%) Final    Discharge Diagnoses: Term Pregnancy-delivered  Discharge Information: Date: 10/26/2011 Activity: pelvic rest Diet: routine Medications: Ibuprofen and Colace Condition: stable Instructions: refer to practice specific booklet Discharge to: home Follow-up Information    [facility] up with [facility]. Make an appointment in 2 weeks.   Contact information:   [address] [place] [address] [phone]         Newborn Data: Live born female  Birth Weight: 6 lb 8.2 oz (2955 g) APGAR: 9, 9  Home with mother.  FRAZIER,NATALIE 10/26/2011, 8:08 AM

## 2011-11-01 NOTE — Discharge Summary (Signed)
Agree with above note.  Tyresa Prindiville H. 11/01/2011 12:39 PM

## 2011-11-23 ENCOUNTER — Ambulatory Visit (INDEPENDENT_AMBULATORY_CARE_PROVIDER_SITE_OTHER): Payer: Medicaid Other | Admitting: Obstetrics & Gynecology

## 2011-11-23 ENCOUNTER — Encounter: Payer: Self-pay | Admitting: Obstetrics & Gynecology

## 2011-11-23 DIAGNOSIS — I10 Essential (primary) hypertension: Secondary | ICD-10-CM

## 2011-11-23 MED ORDER — LISINOPRIL 10 MG PO TABS: 10.0000 mg | ORAL_TABLET | Freq: Every day | ORAL | Status: DC

## 2011-11-23 NOTE — Assessment & Plan Note (Signed)
No problems or complaints.  No vaginal bleeding or discharge, has not yet started her period yet.  Counseled on the important of using a barrier method of birth control while waiting for return visit for Mirena IUD insertion. F/u 2-3 weeks for IUD.

## 2011-11-23 NOTE — Patient Instructions (Addendum)
It was nice to meet you! Please come back in the next few weeks to have your Mirena put in.  You will need to do a urine pregnancy test at that time. MAKE SURE YOU ARE USING CONDOMS IF YOU HAVE SEX BETWEEN NOW AND WHEN WE PUT YOUR IUD IN! You CAN get pregnant at this point. I am starting you on a blood pressure medicine since your blood pressure is high. You need to have a regular doctor.  You can call the [facility] at [phone] and tell them that Dr. Fara Boros would like you to follow up with her.  Or, you can go to any other primary care doctor.      Hypertension Hypertension is another name for high blood pressure. High blood pressure may mean that your heart needs to work harder to pump blood. Blood pressure consists of two numbers, which includes a higher number over a lower number (example: 110/72). HOME CARE   Make lifestyle changes as told by your doctor. This may include weight loss and exercise.   Take your blood pressure medicine every day.   Limit how much salt you use.   Stop smoking if you smoke.   Do not use drugs.   Talk to your doctor if you are using decongestants or birth control pills. These medicines might make blood pressure higher.   Females should not drink more than 1 alcoholic drink per day. Males should not drink more than 2 alcoholic drinks per day.   See your doctor as told.  GET HELP RIGHT AWAY IF:   You have a blood pressure reading with a top number of 180 or higher.   You get a very bad headache.   You get blurred or changing vision.   You feel confused.   You feel weak, numb, or faint.   You get chest or belly (abdominal) pain.   You throw up (vomit).   You cannot breathe very well.  MAKE SURE YOU:   Understand these instructions.   Will watch your condition.   Will get help right away if you are not doing well or get worse.  Document Released: 05/21/2008 Document Revised: 08/15/2011 Document Reviewed:  05/21/2008 [facility] Patient Information 2012 [place], [place].  Intrauterine Device Insertion Most often, an intrauterine device (IUD) is inserted into the uterus to prevent pregnancy. There are 2 types of IUDs available:  Copper IUD. This type of IUD creates an environment that is not favorable to sperm survival. The mechanism of action of the copper IUD is not known for certain. It can stay in place for 10 years.   Hormone IUD. This type of IUD contains the hormone progestin (synthetic progesterone). The progestin thickens the cervical mucus and prevents sperm from entering the uterus, and it also thins the uterine lining. There is no evidence that the hormone IUD prevents implantation. The hormone IUD can stay in place for up to 5 years.  An IUD is the most cost-effective birth control if left in place for the full duration. It may be removed at any time. LET YOUR CAREGIVER KNOW ABOUT:  Sensitivity to metals.   Medicines taken including herbs, eyedrops, over-the-counter medicines, and creams.   Use of steroids (by mouth or creams).   Previous problems with anesthetics or numbing medicine.   Previous gynecological surgery.   History of blood clots or clotting disorders.   Possibility of pregnancy.   Menstrual irregularities.   Concerns regarding unusual vaginal discharge or odors.  Previous experience with an IUD.   Other health problems.  RISKS AND COMPLICATIONS  Accidental puncture (perforation) of the uterus.   Accidental placement of the IUD either in the muscle layer of the uterus (myometrium) or outside the uterus. If this happen, the IUD can be found essentially floating around the bowels. When this happens, the IUD must be taken out surgically.   The IUD may fall out of the uterus (expulsion). This is more common in women who have recently had a child.    Pregnancy in the fallopian tube (ectopic).  BEFORE THE PROCEDURE  Schedule the IUD insertion for when you  will have your menstrual period or right after, to make sure you are not pregnant. Placement of the IUD is better tolerated shortly after a menstrual cycle.   You may need to take tests or be examined to make sure you are not pregnant.   You may be required to take a pregnancy test.   You may be required to get checked for sexually transmitted infections (STIs) prior to placement. Placing an IUD in someone who has an infection can make an infection worse.   You may be given a pain reliever to take 1 or 2 hours before the procedure.   An exam will be performed to determine the size and position of your uterus.   Ask your caregiver about changing or stopping your regular medicines.  PROCEDURE   A tool (speculum) is placed in the vagina. This allows your caregiver to see the lower part of the uterus (cervix).   The cervix is prepped with a medicine that lowers the risk of infection.   You may be given a medicine to numb each side of the cervix (intracervical or paracervical block). This is used to block and control any discomfort with insertion.   A tool (uterine sound) is inserted into the uterus to determine the length of the uterine cavity and the direction the uterus may be tilted.   A slim instrument (IUD inserter) is inserted through the cervical canal and into your uterus.   The IUD is placed in the uterine cavity and the insertion device is removed.   The nylon string that is attached to the IUD, and used for eventual IUD removal, is trimmed. It is trimmed so that it lays high in the vagina, just outside the cervix.  AFTER THE PROCEDURE  You may have bleeding after the procedure. This is normal. It varies from light spotting for a few days to menstrual-like bleeding.   You may have mild cramping.   Practice checking the string coming out of the cervix to make sure the IUD remains in the uterus. If you cannot feel the string, you should schedule a "string check" with your  caregiver.   If you had a hormone IUD inserted, expect that your period may be lighter or nonexistent within a year's time (though this is not always the case). There may be delayed fertility with the hormone IUD as a result of its progesterone effect. When you are ready to become pregnant, it is suggested to have the IUD removed up to 1 year in advance.   Yearly exams are advised.  Document Released: 08/01/2011 Document Revised: 08/15/2011 Document Reviewed: 08/01/2011 [facility] Patient Information 2012 [place], [place].

## 2011-11-23 NOTE — Assessment & Plan Note (Signed)
Lisinopril 10mg  started since pt's repeat BP still elevated at 146/98.  Pt is to f/u at [facility] for primary care.

## 2011-11-23 NOTE — Progress Notes (Unsigned)
  Subjective:     Kylie Hicks is a 25 y.o. female who presents for a postpartum visit. She is 5 weeks postpartum following a spontaneous vaginal delivery. I have fully reviewed the prenatal and intrapartum course. The delivery was at 37.2 gestational weeks. Outcome: spontaneous vaginal delivery. Anesthesia: epidural. Postpartum course has been uncomplicated. Baby's course has been uncomplicated. Baby is feeding by bottle Rush Barer. Bleeding no bleeding. Bowel function is normal. Bladder function is normal. Patient is sexually active. Contraception method is none. Postpartum depression screening: negative. No breast complaints- no swelling, redness, warmth, pain.  No fevers or chills. No N/V/D. No vaginal bleeding or discharge.   Occasional headaches, when pt feels like BP is high. No LE edema, no RUQ pain.  Occasional spots in vision associated with headache, not currently exeriencing.   The following portions of the patient's history were reviewed and updated as appropriate: allergies, current medications, past family history, past medical history, past social history, past surgical history and problem list.  Review of Systems A comprehensive review of systems was negative.   Objective:    BP 141/101  Pulse 62  Temp(Src) 97.9 F (36.6 C) (Oral)  Ht 5\' 3"  (1.6 m)  Wt 216 lb (97.977 kg)  BMI 38.26 kg/m2  Breastfeeding? No  Repeat BP: 146/98 General:  alert, cooperative and no distress   Breasts:  deferred  Lungs: clear to auscultation bilaterally  Heart:  regular rate and rhythm, S1, S2 normal, no murmur, click, rub or gallop  Abdomen: soft, non-tender; bowel sounds normal; no masses,  no organomegaly   Vulva:  normal  Vagina: normal vagina  Cervix:  no cervical motion tenderness and no lesions  Corpus: normal  Adnexa:  normal adnexa  Rectal Exam: Not performed.        Assessment:    Normal postpartum exam. Pap smear not done at today's visit.   Plan:    1. Contraception:  none 2. F/u for Mirena 3. HTN: lisinopril 10mg  daily started 4. Follow up in: a few weeks or as needed.

## 2011-12-10 ENCOUNTER — Emergency Department (HOSPITAL_COMMUNITY)
Admission: EM | Admit: 2011-12-10 | Discharge: 2011-12-10 | Disposition: A | Discharge status: Home / Self Care | Payer: Medicaid Other | Source: Home / Self Care | Attending: Family Medicine | Admitting: Family Medicine

## 2011-12-13 ENCOUNTER — Telehealth: Payer: Self-pay | Admitting: *Deleted

## 2011-12-13 NOTE — Telephone Encounter (Signed)
Pt left message stating that she had her baby on 10/19/11.  She requested a call back about the bleeding that she has been having.  I called her back and discussed her concerns. She states that she had stopped bleeding for 2 wks, then got her first period after delivery. She has now been bleeding for 3 wks.  During the first 2 wks she was changing a pad about every 2 hrs and was having lots of golf ball sized clots. Over the past week, the bleeding has slowed to 1 pad every 3 hrs (approx) and occasional golf ball sized clots. She is scheduled for IUD insertion on 12/20/11 and wants to know if everything is normal. I told pt that I will discuss with the doctor on-call and call her back.  After speaking with dr. Macon Large, she recommended pt have [place] prior to IUD insertion to R/O retained products of conception.  I scheduled appt for [place] on 12/19/11 @ 3 pm and notified pt. Pt voiced understanding and agreement with plan of care.

## 2011-12-19 ENCOUNTER — Telehealth: Payer: Self-pay | Admitting: *Deleted

## 2011-12-19 ENCOUNTER — Ambulatory Visit (HOSPITAL_COMMUNITY)
Admission: RE | Admit: 2011-12-19 | Discharge: 2011-12-19 | Disposition: A | Discharge status: Home / Self Care | Payer: Medicaid Other | Source: Ambulatory Visit | Attending: Obstetrics & Gynecology | Admitting: Obstetrics & Gynecology

## 2011-12-19 NOTE — Telephone Encounter (Signed)
Patient called and left  A message stating she is calling in reference to her appointment tomorrow to have mirena put in - states had to have ultrasound today because of bleeding she has been having since she had her daughter. States I wanted to see if I should still come in- that there wasn't any problem with her ultrasound

## 2011-12-19 NOTE — Telephone Encounter (Signed)
[place] report reviewed.  I called pt and informed her that the [place] did not show any products of conception and she may keep her appt as scheduled tomorrow. Pt voiced understanding.

## 2011-12-20 ENCOUNTER — Ambulatory Visit (INDEPENDENT_AMBULATORY_CARE_PROVIDER_SITE_OTHER): Payer: Medicaid Other | Admitting: Obstetrics and Gynecology

## 2011-12-20 ENCOUNTER — Encounter: Payer: Self-pay | Admitting: Family

## 2011-12-20 VITALS — BP 131/87 | HR 52 | Temp 98.6°F | Ht 63.0 in | Wt 211.0 lb

## 2011-12-20 DIAGNOSIS — Z01812 Encounter for preprocedural laboratory examination: Secondary | ICD-10-CM

## 2011-12-20 NOTE — Patient Instructions (Signed)
Contraceptive Devices (IUD) IUD stands for intrauterine device. Intrauterine means inside the womb (uterus). The purpose of the IUD is to prevent pregnancy. IUDs make it more difficult for your partner's sperm to get into your womb and into your fallopian tubes, where the eggs are fertilized. IUDs also alter the secretions of your cervix, which make it a stronger sperm barrier. They also affect the lining of the womb, so it is harder for an egg to implant. The IUD does not cause an abortion. There are 2 types of IUDs available:  Copper IUD gives off a small amount of copper inside the uterus. This prevents the sperm from going through the uterus, up into the fallopian tube, where the egg is fertilized. The copper IUD can also damage or prevent the fertilized egg from attaching on the inside lining of the uterus. It can stay in place for 10 years. The copper IUD can be used as an emergency contraceptive, if inserted within 5 days after having unprotected sexual intercourse.   Hormone IUD contains progestin (synthetic progesterone), and it releases this hormone into the uterus. The hormone thickens the mucus on the cervix and prevents sperm from entering the uterus. It also weakens the sperm that get into the uterus, so that the sperm and egg cannot live in the fallopian tube. It also makes the inside lining of the uterus thinner, which makes it difficult for a fertilized egg to attach to the uterus. The hormone progestin in the IUD decreases the amount of bleeding during a menstrual period and can be helpful in women who have heavy menstrual periods. The hormone IUD can stay in place for 5 years.  SIDE EFFECTS OF THE IUD:  There may be more cramping or pain with periods.   It may cause heavier, longer periods, which can cause lack of red blood cells (anemia) and can interfere with your daily and sexual activities.  This method of birth control is not usually the best choice for a woman with heavy or  prolonged periods. The birth control pill may be a better choice. IUDs work best for women who have already had a pregnancy, because the cervix is more open, making the insertion of the device easier and less painful. However, many women without children use the IUD. One of the main goals of patient selection is to prevent unintentionally inserting an IUD into a patient who has an STD (sexually transmitted disease), who is at high risk of exposure to an STD, or who is already pregnant. That is why the IUD is inserted during, or right after, a menstrual period. REASONS NOT TO USE AN IUD:  The womb or cervix is not shaped normally.   You have or have had a pelvic infection, such as an STD, in the past 3 months.   You have or suspect cancer in the female organs.   You have an abnormal Pap smear.   You have certain liver diseases.   There is severe infection or inflammation of the cervix (cervicitis).   You have unexplained vaginal bleeding.   You have heart valve problems (unless a heart specialist advises otherwise).   You are allergic to copper (rare).   You previously had a pregnancy outside the uterus (ectopic).   You are pregnant or suspect you are pregnant.   You have prolonged or heavy periods, or heavy pain or cramping with periods (except for the hormone IUD).   You have or suspect pelvic cancer.   You have an   STD.   The cervix or uterus has problems (cervical stenosis, fibroids in the uterus) making it difficult to insert the IUD.  IUDs should be removed when a woman becomes menopausal or pregnant. BENEFITS OF THE IUD:  You are always ready to have sexual intercourse.   The copper IUD does not interfere with your female hormones.   The copper IUD can be used as emergency contraception.   An IUD can be used while nursing.   It works immediately after insertion, and there is no problem getting pregnant when it is removed.   It does not interfere with foreplay.    The progesterone IUD can make heavy menstrual periods lighter.   The progesterone IUD can be used for 5 years.   The copper IUD can be used for 10 years.  RISKS AND COMPLICATIONS  Putting the IUD through the uterus, into the pelvis or abdomen (perforation of the uterus).   Losing the IUD (expulsion). This is more common in women who never had children.   When pregnant with an IUD, there is an increased chance of an infection and loss of the pregnancy.   Pregnancy in the fallopian tube (ectopic).   STD, in women who have more than 1 sex partner. The IUD does not protect against STDs.   Other minor side effects may include:   Headaches.   Feeling sick to your stomach (nausea).   Breast tenderness.   Depression.  PROCEDURE   The IUD is inserted during or right after a menstrual period, to make sure you are not pregnant.   You will lie on an exam table, naked from the waist down.   Your caregiver will do an exam to determine the size and position of your uterus.   Usually, an anesthetic is not needed.   Your caregiver may give you a pain pill to take, 1 or 2 hours before the procedure.   Sometimes, a paracervical block may be used to block and control any discomfort with insertion.   A tool (speculum) is then placed in your vagina (birth canal) so your caregiver can see the cervix.   A sound is sent into the uterus to check the depth of the uterus.   A slim instrument (IUD inserter), which is shaped like a drinking straw, is inserted through the small opening in your cervix and into your uterus.   Then, the IUD is pushed in with a plunger, much like a syringe, and the inserter is removed. There may be some cramping and pain during the insertion.   Relaxing helps to lessen the discomfort.   Following the procedure, you will usually spot blood. Have some pads with you. Avoid using tampons for 2 weeks. Bleeding after the procedure is normal. It varies from light  spotting for a few days to menstrual-like bleeding for up to 3 weeks. It may be like a period if it is near the time for your normal period.   You may also have mild cramping. Only take over-the-counter or prescription medicines for pain, discomfort, or fever as directed by your caregiver. Do not use aspirin, as this may increase bleeding.   Practice checking the string coming out of the cervix, to make sure the IUD is always in the uterus.  HOME CARE INSTRUCTIONS   Do not drive for 24 hours.   Only take over-the-counter or prescription medicines for pain, discomfort, or fever as directed by your caregiver.   No tampons, douching, or sexual intercourse for   2 weeks, or until your caregiver approves.   Rest at home for 24 hours. You may then resume normal activities, unless told otherwise by your caregiver.   Check your IUD prior to resuming sexual activity, to make sure it is in place. Make sure that you can feel the strings. An IUD can be pushed out and lost without the user even knowing it is gone. Also, if the strings are getting longer, it may mean that the IUD is being forced out of the uterus. This means it is no longer offering full protection from pregnancy.   Take any medications your caregiver has ordered, as directed.   Make sure to keep your recheck appointment, so your caregiver can make sure your IUD has remained in place. After that exam, yearly exams are advised, unless you cannot feel the strings of your IUD.   Check that the IUD is still in place by feeling for the strings after every menstrual period.  SEEK MEDICAL CARE IF:   Bleeding is heavier than a normal menstrual cycle.   You have an oral temperature above 102 F (38.9 C).   You have increasing cramps or pains, not relieved with medicine. Or you develop belly (abdominal) pain that does not seem to be related to the same area of earlier cramping and pain.   You are lightheaded, unusually weak, or have fainting  episodes.   You develop pain in the tops of your shoulders (shoulder strap areas).   You are having problems or questions, which have not been answered well enough by your caregiver.   You develop abdominal pain.   You have pain during sexual intercourse.   You cannot feel the IUD strings.   You have abnormal vaginal discharge.   You feel the IUD at the opening of the cervix in the vagina.   You think you are pregnant.   You miss your menstrual period.   The IUD string is hurting your sex partner.   The IUD string has gotten longer.  Document Released: 11/06/2004 Document Revised: 03/20/2011 Document Reviewed: 12/19/2009 [employer] Patient Information 2012 [employer]. 

## 2011-12-20 NOTE — Progress Notes (Signed)
Reason for visit: scheduled for Mirena IUD  HPI: 26 yo 5105 now 7 wks postpartum. Had what she thought was a period 11/30/11 and has continued to have small amounts of vaginal bleeding. Bottlefeeding.  Was seen 12/13/11 for bleeding and had a pelvic US which was normal and ruled out RPOC. Had sex with condom once 6 days ago.     Procedure note:  Patient identified, informed consent performed, signed copy in chart, time out was performed.  Urine pregnancy test negative.  Speculum placed in the vagina.  Cervix visualized.  Cleaned with Betadine x 2.  Grasped anteriourly with a single tooth tenaculum.  Uterus sounded to 8cm.  Mirena IUD placed per manufacturer's recommendations.  Strings trimmed to 3 cm.   Patient tolerated the procedure well but had some cramping and was given ibuprofen 600 mg by mouth. He said by she may continue this every 6 hours as needed for cramping that occurs in the next couple of days  Patient given post procedure instructions and Mirena care card with expiration date.  Patient is asked to check IUD strings periodically and follow up in 4-6 weeks for IUD check.  Kylie Hicks 12/20/2011 3:26 PM

## 2012-01-10 ENCOUNTER — Telehealth: Payer: Self-pay | Admitting: *Deleted

## 2012-01-10 NOTE — Telephone Encounter (Signed)
Spoke with patient she states that she has been having cramps and bleeding on and off since she had her mirena put in. I advised her that this is normal.  She is coming back to clinic on Monday. I advised that she keep this appointment. Pt agrees and will followup as planned.

## 2012-01-10 NOTE — Telephone Encounter (Signed)
Pt left message that she is returning our call.  

## 2012-01-10 NOTE — Telephone Encounter (Signed)
Patient called and left a message stating she is calling in reference to she had a mirena put in 12/20/11 and is having stomach pains and bleeding and pains in her vagina and just wants to understand what is going on.  Called patient and left a message we are returning your call - please call back to clinic

## 2012-01-14 ENCOUNTER — Ambulatory Visit: Payer: Medicaid Other | Admitting: Advanced Practice Midwife

## 2012-01-17 ENCOUNTER — Ambulatory Visit: Payer: Medicaid Other | Admitting: Obstetrics & Gynecology

## 2012-01-27 ENCOUNTER — Encounter (HOSPITAL_COMMUNITY): Payer: Self-pay | Admitting: *Deleted

## 2012-01-27 ENCOUNTER — Inpatient Hospital Stay (HOSPITAL_COMMUNITY)
Admission: AD | Admit: 2012-01-27 | Discharge: 2012-01-27 | Disposition: A | Discharge status: Home / Self Care | Payer: Medicaid Other | Source: Ambulatory Visit | Attending: Obstetrics and Gynecology | Admitting: Obstetrics and Gynecology

## 2012-01-27 DIAGNOSIS — Z30431 Encounter for routine checking of intrauterine contraceptive device: Secondary | ICD-10-CM | POA: Insufficient documentation

## 2012-01-27 DIAGNOSIS — R102 Pelvic and perineal pain: Secondary | ICD-10-CM

## 2012-01-27 DIAGNOSIS — N898 Other specified noninflammatory disorders of vagina: Secondary | ICD-10-CM

## 2012-01-27 DIAGNOSIS — N939 Abnormal uterine and vaginal bleeding, unspecified: Secondary | ICD-10-CM

## 2012-01-27 DIAGNOSIS — N949 Unspecified condition associated with female genital organs and menstrual cycle: Secondary | ICD-10-CM | POA: Insufficient documentation

## 2012-01-27 DIAGNOSIS — N938 Other specified abnormal uterine and vaginal bleeding: Secondary | ICD-10-CM | POA: Insufficient documentation

## 2012-01-27 DIAGNOSIS — I10 Essential (primary) hypertension: Secondary | ICD-10-CM | POA: Insufficient documentation

## 2012-01-27 LAB — DIFFERENTIAL
Basophils Absolute: 0 10*3/uL (ref 0.0–0.1)
Basophils Relative: 1 % (ref 0–1)
Eosinophils Absolute: 0.1 10*3/uL (ref 0.0–0.7)
Eosinophils Relative: 2 % (ref 0–5)

## 2012-01-27 LAB — URINALYSIS, ROUTINE W REFLEX MICROSCOPIC
Glucose, UA: NEGATIVE mg/dL
Ketones, ur: NEGATIVE mg/dL
Protein, ur: NEGATIVE mg/dL
Urobilinogen, UA: 1 mg/dL (ref 0.0–1.0)

## 2012-01-27 LAB — CBC
MCH: 24.7 pg — ABNORMAL LOW (ref 26.0–34.0)
MCHC: 31.5 g/dL (ref 30.0–36.0)
MCV: 78.4 fL (ref 78.0–100.0)
Platelets: 363 10*3/uL (ref 150–400)
RDW: 16.4 % — ABNORMAL HIGH (ref 11.5–15.5)

## 2012-01-27 LAB — URINE MICROSCOPIC-ADD ON

## 2012-01-27 LAB — WET PREP, GENITAL
Trich, Wet Prep: NONE SEEN
Yeast Wet Prep HPF POC: NONE SEEN

## 2012-01-27 MED ORDER — KETOROLAC TROMETHAMINE 60 MG/2ML IM SOLN
60.0000 mg | Freq: Once | INTRAMUSCULAR | Status: AC
  Administered 2012-01-27: 60 mg via INTRAMUSCULAR
  Filled 2012-01-27: qty 2

## 2012-01-27 MED ORDER — LISINOPRIL 10 MG PO TABS: 10.0000 mg | ORAL_TABLET | Freq: Every day | ORAL | Status: DC

## 2012-01-27 MED ORDER — NAPROXEN SODIUM 550 MG PO TABS: 550.0000 mg | ORAL_TABLET | Freq: Two times a day (BID) | ORAL | Status: DC

## 2012-01-27 NOTE — ED Provider Notes (Signed)
Attestation of Attending Supervision of Advanced Practitioner: Evaluation and management procedures were performed by the PA/NP/CNM/OB Fellow under my supervision/collaboration. Chart reviewed and agree with management and plan.  Auria Mckinlay V 01/27/2012 6:15 PM

## 2012-01-27 NOTE — Progress Notes (Signed)
Pt had mirena put in 1 month ago and has been having lower abd and back pain and headache that have progressively gotten worse.

## 2012-01-27 NOTE — ED Provider Notes (Signed)
History     CSN: [id number]  Arrival date & time 01/27/12  1121   None     No chief complaint on file.   HPI Kylie Hicks is a 26 y.o. female who presents to [facility] for abdominal pain that started about a week ago. She describes the pain as sharp, cramping that comes and goes.  Onset of menses at same time as pain. Has had irregular bleeding since IUD placed in Jan. She is a regular patient in the GYN Clinic and has had regular pap smears that are normal. Hx of Chlamydia 6 years ago. Current sex partner x 13 years. Her husband does not like the IUD but the patient states she plans to keep it.   Past Medical History  Diagnosis Date  . Anemia     with pregnancies  . Incompetent cervix     cerclage  . History of preterm labor, current pregnancy   . Chlamydia   . Thyroid disease     history hypothyroid, not on meds  . Hypothyroidism   . Postpartum depression 2006  . Incompetent cervix in pregnancy   . Hypertension     no meds    Past Surgical History  Procedure Date  . Cervical cerclage   . Pilonidal cyst / sinus excision     Family History  Problem Relation Age of Onset  . Hypertension Mother   . Cancer Sister     History  Substance Use Topics  . Smoking status: Current Everyday Smoker -- 0.2 packs/day for 3 years    Types: Cigarettes  . Smokeless tobacco: Not on file  . Alcohol Use: No    OB History    Grav Para Term Preterm Abortions TAB SAB Ect Mult Living   6 6 5 1      6       Review of Systems  Constitutional: Negative for fever, chills, diaphoresis and fatigue.  HENT: Negative for ear pain, congestion, sore throat, facial swelling, neck pain, neck stiffness, dental problem and sinus pressure.   Eyes: Negative for photophobia, pain and discharge.  Respiratory: Negative for cough, chest tightness and wheezing.   Cardiovascular: Negative.   Gastrointestinal: Positive for nausea and abdominal pain. Negative for vomiting, diarrhea, constipation and  abdominal distention.  Genitourinary: Positive for vaginal bleeding and pelvic pain. Negative for dysuria, frequency, flank pain and difficulty urinating.  Musculoskeletal: Positive for back pain. Negative for myalgias and gait problem.  Skin: Negative for color change and rash.  Neurological: Negative for dizziness, speech difficulty, weakness, light-headedness, numbness and headaches.  Psychiatric/Behavioral: Negative for confusion and agitation. The patient is not nervous/anxious.     Allergies  Review of patient's allergies indicates no known allergies.  Home Medications  No current outpatient prescriptions on file.  BP 155/102  Pulse 61  Temp(Src) 99.2 F (37.3 C) (Oral)  Resp 18  Ht 5\' 3"  (1.6 m)  Wt 219 lb 6.4 oz (99.519 kg)  BMI 38.86 kg/m2  LMP 01/21/2012  Breastfeeding? Unknown  Physical Exam  Nursing note and vitals reviewed. Constitutional: She is oriented to person, place, and time. She appears well-developed and well-nourished.       Elevated blood pressure.  HENT:  Head: Normocephalic.  Eyes: EOM are normal.  Neck: Neck supple.  Cardiovascular: Normal rate.   Pulmonary/Chest: Effort normal.  Abdominal: Soft. There is no tenderness.  Genitourinary:       External genitalia without lesions. Small blood vaginal vault. IUD string visible, no CMT,  no adnexal tenderness. Uterus without palpable enlargement.  Musculoskeletal: Normal range of motion.  Neurological: She is alert and oriented to person, place, and time. No cranial nerve deficit.  Skin: Skin is warm and dry.  Psychiatric: She has a normal mood and affect. Her behavior is normal. Judgment and thought content normal.   Results for orders placed during the hospital encounter of 01/27/12 (from the past 24 hour(s))  URINALYSIS, ROUTINE W REFLEX MICROSCOPIC     Status: Abnormal   Collection Time   01/27/12 11:45 AM      Component Value Range   Color, Urine YELLOW  YELLOW    APPearance CLEAR  CLEAR     Specific Gravity, Urine 1.020  1.005 - 1.030    pH 6.0  5.0 - 8.0    Glucose, UA NEGATIVE  NEGATIVE (mg/dL)   Hgb urine dipstick LARGE (*) NEGATIVE    Bilirubin Urine NEGATIVE  NEGATIVE    Ketones, ur NEGATIVE  NEGATIVE (mg/dL)   Protein, ur NEGATIVE  NEGATIVE (mg/dL)   Urobilinogen, UA 1.0  0.0 - 1.0 (mg/dL)   Nitrite NEGATIVE  NEGATIVE    Leukocytes, UA NEGATIVE  NEGATIVE   URINE MICROSCOPIC-ADD ON     Status: Abnormal   Collection Time   01/27/12 11:45 AM      Component Value Range   Squamous Epithelial / LPF FEW (*) RARE    RBC / HPF 7-10  <3 (RBC/hpf)   Bacteria, UA RARE  RARE   POCT PREGNANCY, URINE     Status: Normal   Collection Time   01/27/12 11:52 AM      Component Value Range   Preg Test, Ur NEGATIVE  NEGATIVE   CBC     Status: Abnormal   Collection Time   01/27/12 12:38 PM      Component Value Range   WBC 5.3  4.0 - 10.5 (K/uL)   RBC 4.45  3.87 - 5.11 (MIL/uL)   Hemoglobin 11.0 (*) 12.0 - 15.0 (g/dL)   HCT 95.6 (*) 21.3 - 46.0 (%)   MCV 78.4  78.0 - 100.0 (fL)   MCH 24.7 (*) 26.0 - 34.0 (pg)   MCHC 31.5  30.0 - 36.0 (g/dL)   RDW 08.6 (*) 57.8 - 15.5 (%)   Platelets 363  150 - 400 (K/uL)  DIFFERENTIAL     Status: Normal   Collection Time   01/27/12 12:38 PM      Component Value Range   Neutrophils Relative 43  43 - 77 (%)   Neutro Abs 2.3  1.7 - 7.7 (K/uL)   Lymphocytes Relative 46  12 - 46 (%)   Lymphs Abs 2.5  0.7 - 4.0 (K/uL)   Monocytes Relative 8  3 - 12 (%)   Monocytes Absolute 0.4  0.1 - 1.0 (K/uL)   Eosinophils Relative 2  0 - 5 (%)   Eosinophils Absolute 0.1  0.0 - 0.7 (K/uL)   Basophils Relative 1  0 - 1 (%)   Basophils Absolute 0.0  0.0 - 0.1 (K/uL)  WET PREP, GENITAL     Status: Abnormal   Collection Time   01/27/12  1:00 PM      Component Value Range   Yeast Wet Prep HPF POC NONE SEEN  NONE SEEN    Trich, Wet Prep NONE SEEN  NONE SEEN    Clue Cells Wet Prep HPF POC FEW (*) NONE SEEN    WBC, Wet Prep HPF POC FEW (*) NONE SEEN  Note:  Patient with known hypertension and given Rx from the GYN clinic with refills x 2. Has been out of medication and does not have follow up with Clinic for 2 weeks.   Assessment: Abnormal vaginal bleeding   IUD   Pelvic pain   Hypertension  Plan:  Toradol 60 mg IM   Refill Rx for Lisinopril 10 mg. # 30 tabs   Anaprox DS 1 po bid x 5 days   GC, Chlamydia cultures pending  ED Course: Patient feeling much after Toradol. Will d/c home with short course of NSAIDS. Discussed with patient short course due to HTN. Tylenol as needed.  Procedures  Patient to discuss IUD discomforts for her as well as her partner when she follows up in the GYN Clinic in 2 weeks.  She will return here as needed.  MDM          Kerrie Buffalo, NP 01/27/12 1415

## 2012-01-28 LAB — GC/CHLAMYDIA PROBE AMP, GENITAL: GC Probe Amp, Genital: NEGATIVE

## 2012-02-06 ENCOUNTER — Ambulatory Visit (INDEPENDENT_AMBULATORY_CARE_PROVIDER_SITE_OTHER): Payer: Medicaid Other | Admitting: Physician Assistant

## 2012-02-06 ENCOUNTER — Encounter: Payer: Self-pay | Admitting: Obstetrics and Gynecology

## 2012-02-06 VITALS — BP 138/89 | HR 78 | Temp 98.7°F | Ht 63.0 in | Wt 211.4 lb

## 2012-02-06 DIAGNOSIS — Z30431 Encounter for routine checking of intrauterine contraceptive device: Secondary | ICD-10-CM

## 2012-02-06 DIAGNOSIS — I10 Essential (primary) hypertension: Secondary | ICD-10-CM

## 2012-02-06 MED ORDER — HYDROCHLOROTHIAZIDE 25 MG PO TABS: 25.0000 mg | ORAL_TABLET | Freq: Every day | ORAL | Status: DC

## 2012-02-06 NOTE — Progress Notes (Signed)
Here for IUD string cut, and states BP meds not working. States does not have primary care MD.  Has medicaid

## 2012-02-06 NOTE — Progress Notes (Signed)
Chief Complaint:  Needs IUD string cut   Kylie Hicks is  27 y.o. [phone].  Patient's last menstrual period was 01/21/2012..  She presents complaining of Needs IUD string cut . Reports partner complaint of feeling strings during intercourse. Pt has no complaints.  Obstetrical/Gynecological History: OB History    Grav Para Term Preterm Abortions TAB SAB Ect Mult Living   6 6 5 1      6       Past Medical History: Past Medical History  Diagnosis Date  . Anemia     with pregnancies  . Incompetent cervix     cerclage  . History of preterm labor, current pregnancy   . Chlamydia   . Thyroid disease     history hypothyroid, not on meds  . Hypothyroidism   . Postpartum depression 2006  . Incompetent cervix in pregnancy   . Hypertension     no meds    Past Surgical History: Past Surgical History  Procedure Date  . Cervical cerclage   . Pilonidal cyst / sinus excision   . Intrauterine device insertion     Family History: Family History  Problem Relation Age of Onset  . Hypertension Mother   . Cancer Sister     Social History: History  Substance Use Topics  . Smoking status: Current Everyday Smoker -- 0.2 packs/day for 3 years    Types: Cigarettes  . Smokeless tobacco: Never Used  . Alcohol Use: No    Allergies: No Known Allergies   (Not in a hospital admission)  Review of Systems - Negative except what has been reviewed in HPI  Physical Exam   Blood pressure 138/89, pulse 78, temperature 98.7 F (37.1 C), temperature source Oral, height 5\' 3"  (1.6 m), weight 211 lb 6.4 oz (95.89 kg), last menstrual period 01/21/2012.  General: General appearance - alert, well appearing, and in no distress, oriented to person, place, and time and overweight Mental status - alert, oriented to person, place, and time, normal mood, behavior, speech, dress, motor activity, and thought processes, affect appropriate to mood Focused Gynecological Exam: VULVA: normal appearing  vulva with no masses, tenderness or lesions, VAGINA: normal appearing vagina with normal color and discharge, no lesions, CERVIX: normal appearing cervix without discharge or lesions, IUD strings visible ~ 2-3 cm below os. String looped back into the cervix to the level of ext os,    Assessment: IUD Surveilence Hypertension  Plan: Strings modified for pt comfort Rx HZTZ to be added to Lisinopril FU with PCP, referral to [employer]. 02/06/2012,1:14 PM

## 2012-02-06 NOTE — Patient Instructions (Signed)
Call [phone] for [facility] to be assigned a primary care provider   Hypertension As your heart beats, it forces blood through your arteries. This force is your blood pressure. If the pressure is too high, it is called hypertension (HTN) or high blood pressure. HTN is dangerous because you may have it and not know it. High blood pressure may mean that your heart has to work harder to pump blood. Your arteries may be narrow or stiff. The extra work puts you at risk for heart disease, stroke, and other problems.  Blood pressure consists of two numbers, a higher number over a lower, 110/72, for example. It is stated as "110 over 72." The ideal is below 120 for the top number (systolic) and under 80 for the bottom (diastolic). Write down your blood pressure today. You should pay close attention to your blood pressure if you have certain conditions such as:  Heart failure.   Prior heart attack.   Diabetes   Chronic kidney disease.   Prior stroke.   Multiple risk factors for heart disease.  To see if you have HTN, your blood pressure should be measured while you are seated with your arm held at the level of the heart. It should be measured at least twice. A one-time elevated blood pressure reading (especially in the Emergency Department) does not mean that you need treatment. There may be conditions in which the blood pressure is different between your right and left arms. It is important to see your caregiver soon for a recheck. Most people have essential hypertension which means that there is not a specific cause. This type of high blood pressure may be lowered by changing lifestyle factors such as:  Stress.   Smoking.   Lack of exercise.   Excessive weight.   Drug/tobacco/alcohol use.   Eating less salt.  Most people do not have symptoms from high blood pressure until it has caused damage to the body. Effective treatment can often prevent, delay or reduce that damage. TREATMENT    When a cause has been identified, treatment for high blood pressure is directed at the cause. There are a large number of medications to treat HTN. These fall into several categories, and your caregiver will help you select the medicines that are best for you. Medications may have side effects. You should review side effects with your caregiver. If your blood pressure stays high after you have made lifestyle changes or started on medicines,   Your medication(s) may need to be changed.   Other problems may need to be addressed.   Be certain you understand your prescriptions, and know how and when to take your medicine.   Be sure to follow up with your caregiver within the time frame advised (usually within two weeks) to have your blood pressure rechecked and to review your medications.   If you are taking more than one medicine to lower your blood pressure, make sure you know how and at what times they should be taken. Taking two medicines at the same time can result in blood pressure that is too low.  SEEK IMMEDIATE MEDICAL CARE IF:  You develop a severe headache, blurred or changing vision, or confusion.   You have unusual weakness or numbness, or a faint feeling.   You have severe chest or abdominal pain, vomiting, or breathing problems.  MAKE SURE YOU:   Understand these instructions.   Will watch your condition.   Will get help right away if you are not  doing well or get worse.  Document Released: 12/03/2005 Document Revised: 08/15/2011 Document Reviewed: 07/23/2008 [facility] Patient Information 2012 [place], [place].

## 2012-04-16 ENCOUNTER — Ambulatory Visit: Payer: Medicaid Other | Admitting: Physician Assistant

## 2012-06-30 ENCOUNTER — Emergency Department (HOSPITAL_COMMUNITY)
Admission: EM | Admit: 2012-06-30 | Discharge: 2012-06-30 | Disposition: A | Discharge status: Home / Self Care | Payer: Medicaid Other | Attending: Emergency Medicine | Admitting: Emergency Medicine

## 2012-06-30 ENCOUNTER — Encounter (HOSPITAL_COMMUNITY): Payer: Self-pay | Admitting: Emergency Medicine

## 2012-06-30 DIAGNOSIS — K089 Disorder of teeth and supporting structures, unspecified: Secondary | ICD-10-CM | POA: Insufficient documentation

## 2012-06-30 DIAGNOSIS — K0889 Other specified disorders of teeth and supporting structures: Secondary | ICD-10-CM

## 2012-06-30 DIAGNOSIS — E039 Hypothyroidism, unspecified: Secondary | ICD-10-CM | POA: Insufficient documentation

## 2012-06-30 DIAGNOSIS — I10 Essential (primary) hypertension: Secondary | ICD-10-CM | POA: Insufficient documentation

## 2012-06-30 DIAGNOSIS — F17200 Nicotine dependence, unspecified, uncomplicated: Secondary | ICD-10-CM | POA: Insufficient documentation

## 2012-06-30 MED ORDER — OXYCODONE-ACETAMINOPHEN 5-325 MG PO TABS
2.0000 | ORAL_TABLET | Freq: Once | ORAL | Status: AC
  Administered 2012-06-30: 2 via ORAL
  Filled 2012-06-30: qty 2

## 2012-06-30 MED ORDER — PENICILLIN V POTASSIUM 500 MG PO TABS: 500.0000 mg | ORAL_TABLET | Freq: Four times a day (QID) | ORAL | Status: AC

## 2012-06-30 MED ORDER — OXYCODONE-ACETAMINOPHEN 5-325 MG PO TABS: 1.0000 | ORAL_TABLET | Freq: Four times a day (QID) | ORAL | Status: AC | PRN

## 2012-06-30 MED ORDER — HYDROCHLOROTHIAZIDE 25 MG PO TABS: 25.0000 mg | ORAL_TABLET | Freq: Every day | ORAL | Status: DC

## 2012-06-30 NOTE — ED Notes (Signed)
Per EMS: Pt c/o dental pain and out of BP meds. BP: 188/112; NAD noted at this time.

## 2012-06-30 NOTE — ED Notes (Signed)
Patient discharge to home with written and verbal instructions with steady gait. No acute distress noted.

## 2012-06-30 NOTE — ED Provider Notes (Signed)
Medical screening examination/treatment/procedure(s) were performed by non-physician practitioner and as supervising physician I was immediately available for consultation/collaboration.  Flint Melter, MD 06/30/12 [phone]

## 2012-06-30 NOTE — ED Notes (Signed)
Patient complains left upper and left lower tooth pain. Patient also reports that she is out of her medication for blood pressure.

## 2012-06-30 NOTE — ED Provider Notes (Signed)
History     CSN: [id number]  Arrival date & time 06/30/12  2126   First MD Initiated Contact with Patient 06/30/12 2147      Chief Complaint  Patient presents with  . Dental Pain  . Hypertension    out of meds for 1 week    (Consider location/radiation/quality/duration/timing/severity/associated sxs/prior treatment) HPI Comments: Patient presenting with a chief complaint of dental pain.  She does not have a dentist.  She has taken Ibuprofen for pain without relief.  No acute dental injury.  She also reports that she has a history of HTN, but ran out of her blood pressure medication.  She had been on HCTZ 25 mg in the past and reports that this medication treated her blood pressure well.  She does not have a PCP.    Patient is a 26 y.o. female presenting with tooth pain and hypertension. The history is provided by the patient.  Dental PainThe primary symptoms include mouth pain. Primary symptoms do not include dental injury, oral bleeding, oral lesions, headaches, fever or sore throat. Episode onset: 3 days ago. The symptoms are worsening. The symptoms are new. The symptoms occur constantly.  Additional symptoms include: gum swelling and gum tenderness. Additional symptoms do not include: dental sensitivity to temperature, purulent gums, trismus, facial swelling, trouble swallowing, pain with swallowing, excessive salivation, drooling and ear pain.   Hypertension This is a chronic problem. Pertinent negatives include no chest pain, chills, fever, headaches, nausea, neck pain, sore throat, visual change or vomiting.    Past Medical History  Diagnosis Date  . Anemia     with pregnancies  . Incompetent cervix     cerclage  . History of preterm labor, current pregnancy   . Chlamydia   . Thyroid disease     history hypothyroid, not on meds  . Hypothyroidism   . Postpartum depression 2006  . Incompetent cervix in pregnancy   . Hypertension     no meds    Past Surgical History    Procedure Date  . Cervical cerclage   . Pilonidal cyst / sinus excision   . Intrauterine device insertion     Family History  Problem Relation Age of Onset  . Hypertension Mother   . Cancer Sister     History  Substance Use Topics  . Smoking status: Current Everyday Smoker -- 0.2 packs/day for 3 years    Types: Cigarettes  . Smokeless tobacco: Never Used  . Alcohol Use: No    OB History    Grav Para Term Preterm Abortions TAB SAB Ect Mult Living   6 6 5 1      6       Review of Systems  Constitutional: Negative for fever and chills.  HENT: Positive for dental problem. Negative for ear pain, sore throat, facial swelling, drooling, trouble swallowing, neck pain and neck stiffness.   Cardiovascular: Negative for chest pain.  Gastrointestinal: Negative for nausea and vomiting.  Skin: Negative for color change.  Neurological: Negative for headaches.    Allergies  Review of patient's allergies indicates no known allergies.  Home Medications   Current Outpatient Rx  Name Route Sig Dispense Refill  . HYDROCHLOROTHIAZIDE 25 MG PO TABS Oral Take 1 tablet (25 mg total) by mouth daily. 30 tablet 1  . IBUPROFEN 200 MG PO TABS Oral Take 400 mg by mouth every 8 (eight) hours as needed. For pain.    Marland Kitchen LEVONORGESTREL 20 MCG/24HR IU IUD Intrauterine 1 each by  Intrauterine route once.      BP 142/98  Pulse 70  Temp 98.2 F (36.8 C) (Oral)  Resp 20  SpO2 100%  LMP 06/07/2012  Physical Exam  Nursing note and vitals reviewed. Constitutional: She is oriented to person, place, and time. She appears well-developed and well-nourished. No distress.  HENT:  Head: Normocephalic and atraumatic. No trismus in the jaw.  Mouth/Throat: Uvula is midline, oropharynx is clear and moist and mucous membranes are normal. Abnormal dentition. No dental abscesses or uvula swelling. No oropharyngeal exudate, posterior oropharyngeal edema, posterior oropharyngeal erythema or tonsillar abscesses.        Poor dental hygiene. Pt able to open and close mouth with out difficulty. Airway intact. Uvula midline. Mild gingival swelling with tenderness over affected area, but no fluctuance. No swelling or tenderness of submental and submandibular regions.  Eyes: Conjunctivae and EOM are normal.  Neck: Normal range of motion and full passive range of motion without pain. Neck supple.  Cardiovascular: Normal rate, regular rhythm and normal heart sounds.   Pulmonary/Chest: Effort normal and breath sounds normal. No stridor. No respiratory distress. She has no wheezes.  Musculoskeletal: Normal range of motion.  Lymphadenopathy:       Head (right side): No submental, no submandibular, no tonsillar, no preauricular and no posterior auricular adenopathy present.       Head (left side): No submental, no submandibular, no tonsillar, no preauricular and no posterior auricular adenopathy present.    She has no cervical adenopathy.  Neurological: She is alert and oriented to person, place, and time.  Skin: Skin is warm and dry. No rash noted. She is not diaphoretic.    ED Course  Procedures (including critical care time)  Labs Reviewed - No data to display No results found.   No diagnosis found.    MDM  Patient with toothache.  No gross abscess.  Exam unconcerning for Ludwig's angina or spread of infection.  Will treat with penicillin and pain medicine.  Urged patient to follow-up with dentist.  Given dental referral.  Patient also given prescription for HCTZ and given resource guide for her to establish a PCP.        Pascal Lux [place], PA-C 06/30/12 2316

## 2012-09-30 ENCOUNTER — Encounter (HOSPITAL_COMMUNITY): Payer: Self-pay | Admitting: Emergency Medicine

## 2012-09-30 ENCOUNTER — Emergency Department (INDEPENDENT_AMBULATORY_CARE_PROVIDER_SITE_OTHER)
Admission: EM | Admit: 2012-09-30 | Discharge: 2012-09-30 | Disposition: A | Discharge status: Home / Self Care | Payer: Medicaid Other | Source: Home / Self Care

## 2012-09-30 DIAGNOSIS — N898 Other specified noninflammatory disorders of vagina: Secondary | ICD-10-CM

## 2012-09-30 LAB — WET PREP, GENITAL
Trich, Wet Prep: NONE SEEN
Yeast Wet Prep HPF POC: NONE SEEN

## 2012-09-30 MED ORDER — METRONIDAZOLE 500 MG PO TABS: 500.0000 mg | ORAL_TABLET | Freq: Two times a day (BID) | ORAL | Status: DC

## 2012-09-30 NOTE — ED Provider Notes (Signed)
History     CSN: [id number]  Arrival date & time 09/30/12  2000   None     Chief Complaint  Patient presents with  . Abdominal Pain    (Consider location/radiation/quality/duration/timing/severity/associated sxs/prior treatment) The history is provided by the patient.   26 y.o. female complains of pain with intercourse for two days associated with no vaginal discharge.  Denies abnormal vaginal bleeding, significant pelvic pain or fever. No UTI symptoms. Sexually active, does not use condoms, no change in partner.  Last unprotected intercourse  yesterday ago.  Denies history of known exposure to STD or symptoms in partner.  Last LMP 09/20/12.  History of chlamydia many years ago.  Past Medical History  Diagnosis Date  . Anemia     with pregnancies  . Incompetent cervix     cerclage  . History of preterm labor, current pregnancy   . Chlamydia   . Thyroid disease     history hypothyroid, not on meds  . Hypothyroidism   . Postpartum depression 2006  . Incompetent cervix in pregnancy   . Hypertension     no meds    Past Surgical History  Procedure Date  . Cervical cerclage   . Pilonidal cyst / sinus excision   . Intrauterine device insertion     Family History  Problem Relation Age of Onset  . Hypertension Mother   . Cancer Sister     History  Substance Use Topics  . Smoking status: Current Every Day Smoker -- 0.2 packs/day for 3 years    Types: Cigarettes  . Smokeless tobacco: Never Used  . Alcohol Use: No    OB History    Grav Para Term Preterm Abortions TAB SAB Ect Mult Living   6 6 5 1      6       Review of Systems  Constitutional: Negative.   Respiratory: Negative.   Cardiovascular: Negative.   Gastrointestinal: Positive for nausea and abdominal pain. Negative for vomiting, diarrhea and constipation.  Genitourinary: Positive for pelvic pain. Negative for dysuria, frequency, flank pain, decreased urine volume, vaginal bleeding and vaginal discharge.      Allergies  Review of patient's allergies indicates no known allergies.  Home Medications   Current Outpatient Rx  Name Route Sig Dispense Refill  . HYDROCHLOROTHIAZIDE 25 MG PO TABS Oral Take 1 tablet (25 mg total) by mouth daily. 30 tablet 1  . HYDROCHLOROTHIAZIDE 25 MG PO TABS Oral Take 1 tablet (25 mg total) by mouth daily. 30 tablet 0  . IBUPROFEN 200 MG PO TABS Oral Take 400 mg by mouth every 8 (eight) hours as needed. For pain.    Marland Kitchen LEVONORGESTREL 20 MCG/24HR IU IUD Intrauterine 1 each by Intrauterine route once.    Marland Kitchen METRONIDAZOLE 500 MG PO TABS Oral Take 1 tablet (500 mg total) by mouth 2 (two) times daily. 14 tablet 0    BP 145/95  Pulse 71  Temp 98.6 F (37 C) (Oral)  Resp 18  SpO2 99%  LMP 09/30/2012  Physical Exam  Nursing note and vitals reviewed. Constitutional: She is oriented to person, place, and time. Vital signs are normal. She appears well-developed and well-nourished. She is active and cooperative.  HENT:  Head: Normocephalic.  Eyes: Conjunctivae normal are normal. Pupils are equal, round, and reactive to light. No scleral icterus.  Neck: Trachea normal. Neck supple.  Cardiovascular: Normal rate and regular rhythm.   Pulmonary/Chest: Effort normal and breath sounds normal.  Genitourinary: Pelvic exam was  performed with patient supine. There is no rash, tenderness, lesion or injury on the right labia. There is no rash, tenderness, lesion or injury on the left labia. No erythema, tenderness or bleeding around the vagina. No foreign body around the vagina. No signs of injury around the vagina. Vaginal discharge found.  Lymphadenopathy:       Right: No inguinal adenopathy present.       Left: No inguinal adenopathy present.  Neurological: She is alert and oriented to person, place, and time. No cranial nerve deficit or sensory deficit.  Skin: Skin is warm and dry.  Psychiatric: She has a normal mood and affect. Her speech is normal and behavior is normal.  Judgment and thought content normal. Cognition and memory are normal.    ED Course  Procedures (including critical care time)  Labs Reviewed  WET PREP, GENITAL - Abnormal; Notable for the following:    Clue Cells Wet Prep HPF POC MANY (*)     WBC, Wet Prep HPF POC MANY (*)     All other components within normal limits  GC/CHLAMYDIA PROBE AMP, GENITAL  LAB REPORT - SCANNED   No results found.   1. Vaginal discharge       MDM  Await gc/ct results.  Take medication as prescribed.  Condoms for std prevention.  Follow up with gyn for gyn concerns.      Johnsie Kindred, NP 10/03/12 1216

## 2012-09-30 NOTE — ED Notes (Signed)
Abdominal pain, reports soreness and tenderness "inside".  Denies vaginal discharge.

## 2012-10-01 LAB — GC/CHLAMYDIA PROBE AMP, GENITAL: GC Probe Amp, Genital: NEGATIVE

## 2012-10-01 NOTE — ED Notes (Signed)
GC/Chlamydia neg., Wet prep: Many clue cells, many WBC's. Pt. adequately treated with Flagyl. Kylie Hicks 10/01/2012

## 2012-10-04 NOTE — ED Provider Notes (Signed)
Medical screening examination/treatment/procedure(s) were performed by non-physician practitioner and as supervising physician I was immediately available for consultation/collaboration.  Alexie Lanni, M.D.   Thaila Bottoms C Iyla Balzarini, MD 10/04/12 0856 

## 2013-02-01 ENCOUNTER — Encounter (HOSPITAL_COMMUNITY): Payer: Self-pay

## 2013-02-01 ENCOUNTER — Emergency Department (HOSPITAL_COMMUNITY)
Admission: EM | Admit: 2013-02-01 | Discharge: 2013-02-01 | Disposition: A | Discharge status: Home / Self Care | Payer: Medicaid Other | Attending: Emergency Medicine | Admitting: Emergency Medicine

## 2013-02-01 DIAGNOSIS — Z8619 Personal history of other infectious and parasitic diseases: Secondary | ICD-10-CM | POA: Insufficient documentation

## 2013-02-01 DIAGNOSIS — R112 Nausea with vomiting, unspecified: Secondary | ICD-10-CM | POA: Insufficient documentation

## 2013-02-01 DIAGNOSIS — Z862 Personal history of diseases of the blood and blood-forming organs and certain disorders involving the immune mechanism: Secondary | ICD-10-CM | POA: Insufficient documentation

## 2013-02-01 DIAGNOSIS — F17200 Nicotine dependence, unspecified, uncomplicated: Secondary | ICD-10-CM | POA: Insufficient documentation

## 2013-02-01 DIAGNOSIS — H53149 Visual discomfort, unspecified: Secondary | ICD-10-CM | POA: Insufficient documentation

## 2013-02-01 DIAGNOSIS — I10 Essential (primary) hypertension: Secondary | ICD-10-CM | POA: Insufficient documentation

## 2013-02-01 DIAGNOSIS — R51 Headache: Secondary | ICD-10-CM | POA: Insufficient documentation

## 2013-02-01 DIAGNOSIS — Z8639 Personal history of other endocrine, nutritional and metabolic disease: Secondary | ICD-10-CM | POA: Insufficient documentation

## 2013-02-01 DIAGNOSIS — R5381 Other malaise: Secondary | ICD-10-CM | POA: Insufficient documentation

## 2013-02-01 DIAGNOSIS — Z8742 Personal history of other diseases of the female genital tract: Secondary | ICD-10-CM | POA: Insufficient documentation

## 2013-02-01 DIAGNOSIS — R5383 Other fatigue: Secondary | ICD-10-CM | POA: Insufficient documentation

## 2013-02-01 LAB — POCT I-STAT, CHEM 8
BUN: 14 mg/dL (ref 6–23)
Hemoglobin: 12.9 g/dL (ref 12.0–15.0)
Potassium: 3.4 mEq/L — ABNORMAL LOW (ref 3.5–5.1)
Sodium: 141 mEq/L (ref 135–145)
TCO2: 24 mmol/L (ref 0–100)

## 2013-02-01 MED ORDER — METOCLOPRAMIDE HCL 5 MG/ML IJ SOLN
10.0000 mg | Freq: Once | INTRAMUSCULAR | Status: AC
  Administered 2013-02-01: 10 mg via INTRAVENOUS
  Filled 2013-02-01: qty 2

## 2013-02-01 MED ORDER — HYDROCHLOROTHIAZIDE 25 MG PO TABS: 25.0000 mg | ORAL_TABLET | Freq: Every day | ORAL | Status: DC

## 2013-02-01 MED ORDER — KETOROLAC TROMETHAMINE 30 MG/ML IJ SOLN
30.0000 mg | Freq: Once | INTRAMUSCULAR | Status: AC
  Administered 2013-02-01: 30 mg via INTRAVENOUS
  Filled 2013-02-01: qty 1

## 2013-02-01 MED ORDER — SODIUM CHLORIDE 0.9 % IV BOLUS (SEPSIS)
1000.0000 mL | Freq: Once | INTRAVENOUS | Status: AC
  Administered 2013-02-01: 1000 mL via INTRAVENOUS

## 2013-02-01 MED ORDER — DIPHENHYDRAMINE HCL 50 MG/ML IJ SOLN
25.0000 mg | Freq: Once | INTRAMUSCULAR | Status: AC
  Administered 2013-02-01: 25 mg via INTRAVENOUS
  Filled 2013-02-01: qty 1

## 2013-02-01 MED ORDER — ONDANSETRON HCL 4 MG/2ML IJ SOLN
4.0000 mg | Freq: Once | INTRAMUSCULAR | Status: AC
  Administered 2013-02-01: 4 mg via INTRAVENOUS
  Filled 2013-02-01: qty 2

## 2013-02-01 NOTE — ED Notes (Signed)
The patient is AOx4 and comfortable with the discharge instructions. 

## 2013-02-01 NOTE — ED Notes (Signed)
Pt. C/o HA starting yesterday and nausea starting today. Hx of high BP and hasn't been taking BP medicine for approx 1 month. Hx of same symptoms when stop taking BP medication.

## 2013-02-01 NOTE — ED Provider Notes (Signed)
History     CSN: [id number]  Arrival date & time 02/01/13  7829   First MD Initiated Contact with Patient 02/01/13 2038      Chief Complaint  Patient presents with  . Headache    (Consider location/radiation/quality/duration/timing/severity/associated sxs/prior treatment) HPI Comments: 27 y.o. female presents today complaining of gradual onset headache and nausea since 1pm. Pt rates pain 10/10. Pt took no interventions. Pt states that this feels the same as the last time she stopped taking her BP medication (pt switched to HCTZ a few months ago). Lying still in a dark room makes it better. Movement and lights make it worse. Pt admits vomiting x1. Pt admits generalized weakness. Denies visual disturbances, numbness. Course is persistent. PMHx significant for HTN.    Patient is a 27 y.o. female presenting with headaches.  Headache Associated symptoms: nausea, photophobia and vomiting   Associated symptoms: no diarrhea, no dizziness, no fever, no neck pain, no neck stiffness and no numbness     Past Medical History  Diagnosis Date  . Anemia     with pregnancies  . Incompetent cervix     cerclage  . History of preterm labor, current pregnancy   . Chlamydia   . Thyroid disease     history hypothyroid, not on meds  . Hypothyroidism   . Postpartum depression 2006  . Incompetent cervix in pregnancy   . Hypertension     no meds    Past Surgical History  Procedure Laterality Date  . Cervical cerclage    . Pilonidal cyst / sinus excision    . Intrauterine device insertion      Family History  Problem Relation Age of Onset  . Hypertension Mother   . Cancer Sister     History  Substance Use Topics  . Smoking status: Current Every Day Smoker -- 0.25 packs/day for 3 years    Types: Cigarettes  . Smokeless tobacco: Never Used  . Alcohol Use: No    OB History   Grav Para Term Preterm Abortions TAB SAB Ect Mult Living   6 6 5 1      6       Review of Systems   Constitutional: Negative for fever and diaphoresis.  HENT: Negative for neck pain and neck stiffness.   Eyes: Positive for photophobia. Negative for visual disturbance.  Respiratory: Negative for apnea, chest tightness and shortness of breath.   Cardiovascular: Negative for chest pain and palpitations.  Gastrointestinal: Positive for nausea and vomiting. Negative for diarrhea and constipation.       Vomiting x1, no blood, no mucous  Genitourinary: Negative for dysuria.  Musculoskeletal: Negative for gait problem.  Skin: Negative for rash.  Neurological: Positive for headaches. Negative for dizziness, weakness, light-headedness and numbness.       Frontal headache, throbbing    Allergies  Review of patient's allergies indicates no known allergies.  Home Medications  No current outpatient prescriptions on file.  BP 140/89  Pulse 56  Temp(Src) 98.4 F (36.9 C) (Oral)  Resp 18  SpO2 100%  Physical Exam  Nursing note and vitals reviewed. Constitutional: She is oriented to person, place, and time. She appears well-developed and well-nourished. No distress.  HENT:  Head: Normocephalic and atraumatic.  Right Ear: Tympanic membrane normal.  Left Ear: Tympanic membrane normal.  Nose: No mucosal edema or rhinorrhea. Right sinus exhibits no maxillary sinus tenderness and no frontal sinus tenderness. Left sinus exhibits no maxillary sinus tenderness and no frontal sinus tenderness.  Eyes: EOM are normal. Pupils are equal, round, and reactive to light.  Neck: Normal range of motion. Neck supple.  No meningeal signs  Cardiovascular: Normal rate, regular rhythm and normal heart sounds.  Exam reveals no gallop and no friction rub.   No murmur heard. Pulmonary/Chest: Effort normal and breath sounds normal. No respiratory distress. She has no wheezes. She has no rales. She exhibits no tenderness.  Abdominal: Soft. Bowel sounds are normal. She exhibits no distension. There is no tenderness.  There is no rebound and no guarding.  Musculoskeletal: Normal range of motion. She exhibits no edema and no tenderness.  Neurological: She is alert and oriented to person, place, and time. No cranial nerve deficit.  Skin: Skin is warm and dry. She is not diaphoretic. No erythema.    ED Course  Procedures (including critical care time)   Results for orders placed during the hospital encounter of 02/01/13  POCT I-STAT, CHEM 8      Result Value Range   Sodium 141  135 - 145 mEq/L   Potassium 3.4 (*) 3.5 - 5.1 mEq/L   Chloride 104  96 - 112 mEq/L   BUN 14  6 - 23 mg/dL   Creatinine, Ser 1.61  0.50 - 1.10 mg/dL   Glucose, Bld 096 (*) 70 - 99 mg/dL   Calcium, Ion 0.45  4.09 - 1.23 mmol/L   TCO2 24  0 - 100 mmol/L   Hemoglobin 12.9  12.0 - 15.0 g/dL   HCT 81.1  91.4 - 78.2 %   Labs Reviewed - No data to display No results found.   Diagnosis: headache   MDM  Pt stable with no focal deficits. Pt states headache is a 10/10. Pt BP at 140/89 during PE which pt states is about her baseline. Will give one bolus of IVF with headache cocktail and re-evaluate. Pain reduced to zero. Pt feels well and wants to go home.    At this time there does not appear to be any evidence of an acute emergency medical condition and the patient appears stable for discharge with appropriate outpatient follow up. Discussed hypokalemia with pt and importance of regular follow up for hypertension and potassium levels while taking HCTZ. Pt will follow up at [facility]. Provided refill for HCTZ, resource guide, and information on DASH diet and high potassium food. Diagnosis was discussed with patient who verbalizes understanding and is agreeable to discharge. Pt case discussed with Dr. Preston Fleeting who agrees with my plan.    Glade Nurse, PA-C 02/01/13 2301

## 2013-02-01 NOTE — ED Notes (Signed)
Pt. C/o HA starting yesterday and nauseated today. States has hx of high BP but hasn't been taking her hydrochlorothiazide for the past month. Pt. States hx of same symptoms when not taking BP medicine in past. Pt. Alert and oriented x4. No weakness, drift noted in all for extremities. No facial droop or numbness.

## 2013-02-01 NOTE — ED Provider Notes (Signed)
Medical screening examination/treatment/procedure(s) were performed by non-physician practitioner and as supervising physician I was immediately available for consultation/collaboration.   Dione Booze, MD 02/01/13 [phone]

## 2013-04-22 ENCOUNTER — Emergency Department (HOSPITAL_COMMUNITY)
Admission: EM | Admit: 2013-04-22 | Discharge: 2013-04-22 | Disposition: A | Discharge status: Home / Self Care | Payer: Medicaid Other | Attending: Emergency Medicine | Admitting: Emergency Medicine

## 2013-04-22 ENCOUNTER — Encounter (HOSPITAL_COMMUNITY): Payer: Self-pay | Admitting: Emergency Medicine

## 2013-04-22 DIAGNOSIS — Z8639 Personal history of other endocrine, nutritional and metabolic disease: Secondary | ICD-10-CM | POA: Insufficient documentation

## 2013-04-22 DIAGNOSIS — Z8751 Personal history of pre-term labor: Secondary | ICD-10-CM | POA: Insufficient documentation

## 2013-04-22 DIAGNOSIS — Z8659 Personal history of other mental and behavioral disorders: Secondary | ICD-10-CM | POA: Insufficient documentation

## 2013-04-22 DIAGNOSIS — Z8619 Personal history of other infectious and parasitic diseases: Secondary | ICD-10-CM | POA: Insufficient documentation

## 2013-04-22 DIAGNOSIS — Z862 Personal history of diseases of the blood and blood-forming organs and certain disorders involving the immune mechanism: Secondary | ICD-10-CM | POA: Insufficient documentation

## 2013-04-22 DIAGNOSIS — F17200 Nicotine dependence, unspecified, uncomplicated: Secondary | ICD-10-CM | POA: Insufficient documentation

## 2013-04-22 DIAGNOSIS — Z79899 Other long term (current) drug therapy: Secondary | ICD-10-CM | POA: Insufficient documentation

## 2013-04-22 DIAGNOSIS — M25539 Pain in unspecified wrist: Secondary | ICD-10-CM | POA: Insufficient documentation

## 2013-04-22 DIAGNOSIS — R200 Anesthesia of skin: Secondary | ICD-10-CM

## 2013-04-22 DIAGNOSIS — R209 Unspecified disturbances of skin sensation: Secondary | ICD-10-CM | POA: Insufficient documentation

## 2013-04-22 DIAGNOSIS — Z8742 Personal history of other diseases of the female genital tract: Secondary | ICD-10-CM | POA: Insufficient documentation

## 2013-04-22 DIAGNOSIS — I10 Essential (primary) hypertension: Secondary | ICD-10-CM | POA: Insufficient documentation

## 2013-04-22 DIAGNOSIS — M25531 Pain in right wrist: Secondary | ICD-10-CM

## 2013-04-22 MED ORDER — NAPROXEN 500 MG PO TABS: 500.0000 mg | ORAL_TABLET | Freq: Two times a day (BID) | ORAL | Status: DC

## 2013-04-22 NOTE — ED Provider Notes (Signed)
History     CSN: [id number]  Arrival date & time 04/22/13  0913   First MD Initiated Contact with Patient 04/22/13 [phone]      Chief Complaint  Patient presents with  . Hand Pain    right    (Consider location/radiation/quality/duration/timing/severity/associated sxs/prior treatment) HPI Comments: 27 y.o. Female with PNMx of HTN and anemia presents with right hand swelling and pain over anterior wrist for the last week. Admits occasional numbness and tingling, especially when she is using it, picking up things around the house. Pt is not employed outside the home.    Severity: Moderate  Onset quality: Gradual  Duration: one week Timing: Comes and goes Progression: Worsening Relieved by: rest Worsened by: use Ineffective treatments: None tried    Patient is a 27 y.o. female presenting with hand pain.  Hand Pain Associated symptoms include numbness. Pertinent negatives include no arthralgias, chest pain, diaphoresis, fever, headaches, myalgias, nausea, neck pain, rash, vomiting or weakness.    Past Medical History  Diagnosis Date  . Anemia     with pregnancies  . Incompetent cervix     cerclage  . History of preterm labor, current pregnancy   . Chlamydia   . Thyroid disease     history hypothyroid, not on meds  . Hypothyroidism   . Postpartum depression 2006  . Incompetent cervix in pregnancy   . Hypertension     no meds    Past Surgical History  Procedure Laterality Date  . Cervical cerclage    . Pilonidal cyst / sinus excision    . Intrauterine device insertion      Family History  Problem Relation Age of Onset  . Hypertension Mother   . Cancer Sister     History  Substance Use Topics  . Smoking status: Current Every Day Smoker -- 0.25 packs/day for 3 years    Types: Cigarettes  . Smokeless tobacco: Never Used  . Alcohol Use: No    OB History   Grav Para Term Preterm Abortions TAB SAB Ect Mult Living   6 6 5 1      6       Review of Systems   Constitutional: Negative for fever and diaphoresis.  HENT: Negative for neck pain and neck stiffness.   Eyes: Negative for visual disturbance.  Respiratory: Negative for apnea, chest tightness and shortness of breath.   Cardiovascular: Negative for chest pain and palpitations.  Gastrointestinal: Negative for nausea, vomiting, diarrhea and constipation.  Genitourinary: Negative for dysuria.  Musculoskeletal: Negative for myalgias, arthralgias and gait problem.  Skin: Negative for rash and wound.  Neurological: Positive for numbness. Negative for dizziness, weakness, light-headedness and headaches.       Right wrist, 3rd, 4th, and 5th digits numbness, pain    Allergies  Review of patient's allergies indicates no known allergies.  Home Medications   Current Outpatient Rx  Name  Route  Sig  Dispense  Refill  . hydrochlorothiazide (HYDRODIURIL) 25 MG tablet   Oral   Take 1 tablet (25 mg total) by mouth daily.   30 tablet   1     BP 125/78  Pulse 78  Temp(Src) 97.5 F (36.4 C) (Oral)  Resp 18  SpO2 97%  LMP 03/26/2013  Physical Exam  Nursing note and vitals reviewed. Constitutional: She is oriented to person, place, and time. She appears well-developed and well-nourished. No distress.  HENT:  Head: Normocephalic and atraumatic.  Eyes: Conjunctivae and EOM are normal.  Neck: Normal  range of motion. Neck supple.  No meningeal signs  Cardiovascular: Normal rate and intact distal pulses.   Pulmonary/Chest: Effort normal. No respiratory distress.  Abdominal: Soft. There is no tenderness.  Musculoskeletal: Normal range of motion. She exhibits edema and tenderness.  FROM to right wrist and all digits  Neurological: She is alert and oriented to person, place, and time. No cranial nerve deficit.  Sensation to light touch and two point discrimation intact. Positive Tinnel test   Skin: Skin is warm and dry. She is not diaphoretic. No erythema.  Psychiatric: She has a normal mood  and affect.    ED Course  Procedures (including critical care time)  Labs Reviewed - No data to display No results found. Discharge Medication List as of 04/22/2013 11:23 AM    START taking these medications   Details  naproxen (NAPROSYN) 500 MG tablet Take 1 tablet (500 mg total) by mouth 2 (two) times daily with a meal., Starting 04/22/2013, Until Discontinued, Print          1. Wrist pain, acute, right   2. Numbness of fingers       MDM  right hand swelling and pain over anterior wrist for the last week. Admits occasional numbness and tingling, especially when she is using it, picking up things around the house.right hand swelling and pain over anterior wrist for the last week. Normal neuro exam. No injury. Admits occasional numbness and tingling, especially when she is using it, picking up things around the house. Positive tinnels sign, will d/c for follow up for possible carpal tunnel. Recommend bracing and prescribed naproxen. Pt understands and is in agreement with plan.  Glade Nurse, PA-C 04/22/13 2127

## 2013-04-22 NOTE — ED Notes (Signed)
MD at bedside./ PA

## 2013-04-22 NOTE — ED Notes (Signed)
Patient states R hand has been swelling x 1 week.  Patient denies injury to hand.

## 2013-04-26 NOTE — ED Provider Notes (Signed)
Medical screening examination/treatment/procedure(s) were performed by non-physician practitioner and as supervising physician I was immediately available for consultation/collaboration.  Zavior Thomason, MD 04/26/13 1506 

## 2013-05-06 ENCOUNTER — Encounter: Payer: Self-pay | Admitting: Obstetrics & Gynecology

## 2013-05-06 ENCOUNTER — Ambulatory Visit (INDEPENDENT_AMBULATORY_CARE_PROVIDER_SITE_OTHER): Payer: Medicaid Other | Admitting: Obstetrics & Gynecology

## 2013-05-06 VITALS — BP 137/89 | HR 80 | Temp 98.7°F | Ht 62.0 in | Wt 224.5 lb

## 2013-05-06 DIAGNOSIS — Z3009 Encounter for other general counseling and advice on contraception: Secondary | ICD-10-CM

## 2013-05-06 DIAGNOSIS — Z309 Encounter for contraceptive management, unspecified: Secondary | ICD-10-CM

## 2013-05-06 DIAGNOSIS — Z30432 Encounter for removal of intrauterine contraceptive device: Secondary | ICD-10-CM

## 2013-05-06 MED ORDER — LEVONORGESTREL-ETHINYL ESTRAD 0.1-20 MG-MCG PO TABS: 1.0000 | ORAL_TABLET | Freq: Every day | ORAL | Status: DC

## 2013-05-06 NOTE — Progress Notes (Signed)
  Subjective:    Patient ID: Kylie Hicks, female    DOB: 1986/04/13, 27 y.o.   MRN: [id number]  HPI  27 yo S AA AP 6 ( 29 yo to 27 yo) here because she would like her IUD removed. She does not think that she wants another pregnancy, but she wants the IUD out because it is causing her cramps. She wants OCPs but is aware that her BP is slightly elevated. She understands that if she starts the pill and her BP goes up, that she will have to stop the pill.   Review of Systems     Objective:   Physical Exam  IUD removed and noted to be intact      Assessment & Plan:  Contraception- I have prescribed Levlite. She understands to use a back up method for 1 month.

## 2013-06-08 ENCOUNTER — Ambulatory Visit: Payer: Medicaid Other | Admitting: Obstetrics & Gynecology

## 2013-06-22 ENCOUNTER — Encounter: Payer: Self-pay | Admitting: *Deleted

## 2013-08-30 ENCOUNTER — Emergency Department (HOSPITAL_COMMUNITY)
Admission: EM | Admit: 2013-08-30 | Discharge: 2013-08-30 | Disposition: A | Discharge status: Home / Self Care | Payer: Medicaid Other | Source: Home / Self Care | Attending: Family Medicine | Admitting: Family Medicine

## 2013-08-30 ENCOUNTER — Encounter (HOSPITAL_COMMUNITY): Payer: Self-pay | Admitting: *Deleted

## 2013-08-30 ENCOUNTER — Other Ambulatory Visit (HOSPITAL_COMMUNITY)
Admission: RE | Admit: 2013-08-30 | Discharge: 2013-08-30 | Disposition: A | Discharge status: Home / Self Care | Payer: Medicaid Other | Source: Ambulatory Visit | Attending: Family Medicine | Admitting: Family Medicine

## 2013-08-30 DIAGNOSIS — Z113 Encounter for screening for infections with a predominantly sexual mode of transmission: Secondary | ICD-10-CM | POA: Insufficient documentation

## 2013-08-30 DIAGNOSIS — Z3201 Encounter for pregnancy test, result positive: Secondary | ICD-10-CM

## 2013-08-30 DIAGNOSIS — N760 Acute vaginitis: Secondary | ICD-10-CM | POA: Insufficient documentation

## 2013-08-30 DIAGNOSIS — R110 Nausea: Secondary | ICD-10-CM

## 2013-08-30 LAB — POCT PREGNANCY, URINE: Preg Test, Ur: POSITIVE — AB

## 2013-08-30 MED ORDER — ONDANSETRON 4 MG PO TBDP: 4.0000 mg | ORAL_TABLET | Freq: Four times a day (QID) | ORAL | Status: DC | PRN

## 2013-08-30 NOTE — ED Notes (Signed)
Patient complains of nausea and sharp pains in abdomen x 1 week; hot flashes; denies vomiting, diarrhea.

## 2013-08-30 NOTE — ED Provider Notes (Signed)
CSN: [id number]     Arrival date & time 08/30/13  1342 History   First MD Initiated Contact with Patient 08/30/13 1504     Chief Complaint  Patient presents with  . Nausea   (Consider location/radiation/quality/duration/timing/severity/associated sxs/prior Treatment) HPI Comments: 27 year old female presents for evaluation of nausea, upper, or acting, hot flashes for about one week. She has taken numerous over-the-counter urine pregnancy test, the last 2 of which were positive. She is here today because she wants to make sure that pregnancy is the reason why she is feeling bad and make sure nothing is wrong. She has had 3 children previous to this, but she did not have nausea with those pregnancies. She denies lower abdominal pain, vaginal discharge or bleeding, fever, chills. The cramping pain is mild and intermittent, she is in no pain at this time. She denies history of ectopic pregnancy. Her last pregnancy was complicated only by incompetent cervix which was adequately treated with cerclage. She is currently being treated for hypertension with hydrochlorothiazide but ran out 2 days ago. She occasionally takes Tylenol for aches and pains. She does smoke cigarettes   Past Medical History  Diagnosis Date  . Anemia     with pregnancies  . Incompetent cervix     cerclage  . History of preterm labor, current pregnancy   . Chlamydia   . Thyroid disease     history hypothyroid, not on meds  . Hypothyroidism   . Postpartum depression 2006  . Incompetent cervix in pregnancy   . Hypertension     no meds   Past Surgical History  Procedure Laterality Date  . Cervical cerclage    . Pilonidal cyst / sinus excision    . Intrauterine device insertion     Family History  Problem Relation Age of Onset  . Hypertension Mother   . Cancer Sister    History  Substance Use Topics  . Smoking status: Current Every Day Smoker -- 0.25 packs/day for 3 years    Types: Cigarettes  . Smokeless tobacco:  Never Used  . Alcohol Use: No   OB History   Grav Para Term Preterm Abortions TAB SAB Ect Mult Living   6 6 5 1      6      Review of Systems  Constitutional: Negative for fever and chills.  Eyes: Negative for visual disturbance.  Respiratory: Negative for cough and shortness of breath.   Cardiovascular: Negative for chest pain, palpitations and leg swelling.  Gastrointestinal: Positive for nausea and abdominal pain (cramping). Negative for vomiting and diarrhea.  Endocrine: Negative for polydipsia and polyuria.  Genitourinary: Negative for dysuria, urgency and frequency.  Musculoskeletal: Negative for myalgias and arthralgias.  Skin: Negative for rash.  Neurological: Negative for dizziness, weakness and light-headedness.    Allergies  Review of patient's allergies indicates no known allergies.  Home Medications   Current Outpatient Rx  Name  Route  Sig  Dispense  Refill  . hydrochlorothiazide (HYDRODIURIL) 25 MG tablet   Oral   Take 1 tablet (25 mg total) by mouth daily.   30 tablet   1   . levonorgestrel-ethinyl estradiol (AVIANE,ALESSE,LESSINA) 0.1-20 MG-MCG tablet   Oral   Take 1 tablet by mouth daily.   1 Package   2   . naproxen (NAPROSYN) 500 MG tablet   Oral   Take 1 tablet (500 mg total) by mouth 2 (two) times daily with a meal.   30 tablet   1   .  ondansetron (ZOFRAN-ODT) 4 MG disintegrating tablet   Oral   Take 1 tablet (4 mg total) by mouth every 6 (six) hours as needed for nausea. PRN for nausea or vomiting   12 tablet   0    BP 136/92  Pulse 66  Temp(Src) 99.2 F (37.3 C) (Oral)  Resp 18  SpO2 100%  LMP 08/05/2013 Physical Exam  Nursing note and vitals reviewed. Constitutional: She is oriented to person, place, and time. Vital signs are normal. She appears well-developed and well-nourished. No distress.  HENT:  Head: Normocephalic and atraumatic.  Pulmonary/Chest: Effort normal. No respiratory distress.  Abdominal: Soft. She exhibits no  mass. There is no tenderness. There is no rebound and no guarding.  Genitourinary: Uterus normal. Cervix exhibits no motion tenderness. Vaginal discharge ([place], likely physiologic) found.  Neurological: She is alert and oriented to person, place, and time. She has normal strength. Coordination normal.  Skin: Skin is warm and dry. No rash noted. She is not diaphoretic.  Psychiatric: She has a normal mood and affect. Judgment normal.    ED Course  Procedures (including critical care time) Labs Review Labs Reviewed  POCT PREGNANCY, URINE - Abnormal; Notable for the following:    Preg Test, Ur POSITIVE (*)    All other components within normal limits  CERVICOVAGINAL ANCILLARY ONLY   Imaging Review No results found.  MDM   1. Positive urine pregnancy test   2. Nausea    Urine pregnancy test is positive. There are no signs of ectopic pregnancy on her physical exam. Given Zofran for nausea to take as needed. Her OB/GYN is at [facility] hospital, she will call tomorrow to set up an appointment.   Meds ordered this encounter  Medications  . ondansetron (ZOFRAN-ODT) 4 MG disintegrating tablet    Sig: Take 1 tablet (4 mg total) by mouth every 6 (six) hours as needed for nausea. PRN for nausea or vomiting    Dispense:  12 tablet    Refill:  0     Graylon Good, PA-C 08/30/13 1553

## 2013-08-31 NOTE — ED Provider Notes (Signed)
Medical screening examination/treatment/procedure(s) were performed by a resident physician or non-physician practitioner and as the supervising physician I was immediately available for consultation/collaboration.  Evan Corey, MD    Evan S Corey, MD 08/31/13 0756 

## 2013-09-01 ENCOUNTER — Telehealth (HOSPITAL_COMMUNITY): Payer: Self-pay | Admitting: *Deleted

## 2013-09-01 MED ORDER — METRONIDAZOLE 500 MG PO TABS: 500.0000 mg | ORAL_TABLET | Freq: Two times a day (BID) | ORAL | Status: DC

## 2013-09-01 NOTE — ED Notes (Signed)
GC/Chlamydia neg., Affirm: Candida and Trich neg., Gardnerella pos.  Pt. is pregnant. Labs shown to Dr. Denyse Amass and he e-prescribed Flagyl to the [facility] on [address] and [place].  I called pt. Pt. verified x 2 and given results.  Pt. told she needs Flagyl for bacterial vaginosis.  Pt.'s questions about bacterial vaginosis answered.  Pt. told where to pick up Rx.   Pt. instructed to no alcohol while taking this medication.  Pt. voiced understanding. Vassie Moselle 09/01/2013

## 2013-09-03 ENCOUNTER — Encounter: Payer: Self-pay | Admitting: Obstetrics & Gynecology

## 2013-09-03 ENCOUNTER — Ambulatory Visit (HOSPITAL_COMMUNITY)
Admission: RE | Admit: 2013-09-03 | Discharge: 2013-09-03 | Disposition: A | Discharge status: Home / Self Care | Payer: Medicaid Other | Source: Ambulatory Visit | Attending: Obstetrics & Gynecology | Admitting: Obstetrics & Gynecology

## 2013-09-03 ENCOUNTER — Other Ambulatory Visit (HOSPITAL_COMMUNITY)
Admission: RE | Admit: 2013-09-03 | Discharge: 2013-09-03 | Disposition: A | Discharge status: Home / Self Care | Payer: Medicaid Other | Source: Ambulatory Visit | Attending: Obstetrics & Gynecology | Admitting: Obstetrics & Gynecology

## 2013-09-03 ENCOUNTER — Ambulatory Visit (INDEPENDENT_AMBULATORY_CARE_PROVIDER_SITE_OTHER): Payer: Medicaid Other | Admitting: Obstetrics & Gynecology

## 2013-09-03 VITALS — BP 127/82 | Wt 231.6 lb

## 2013-09-03 DIAGNOSIS — O09219 Supervision of pregnancy with history of pre-term labor, unspecified trimester: Secondary | ICD-10-CM | POA: Insufficient documentation

## 2013-09-03 DIAGNOSIS — O99334 Smoking (tobacco) complicating childbirth: Secondary | ICD-10-CM

## 2013-09-03 DIAGNOSIS — O09211 Supervision of pregnancy with history of pre-term labor, first trimester: Secondary | ICD-10-CM

## 2013-09-03 DIAGNOSIS — O34599 Maternal care for other abnormalities of gravid uterus, unspecified trimester: Secondary | ICD-10-CM | POA: Insufficient documentation

## 2013-09-03 DIAGNOSIS — E039 Hypothyroidism, unspecified: Secondary | ICD-10-CM

## 2013-09-03 DIAGNOSIS — O10011 Pre-existing essential hypertension complicating pregnancy, first trimester: Secondary | ICD-10-CM

## 2013-09-03 DIAGNOSIS — O0940 Supervision of pregnancy with grand multiparity, unspecified trimester: Secondary | ICD-10-CM | POA: Insufficient documentation

## 2013-09-03 DIAGNOSIS — Z01419 Encounter for gynecological examination (general) (routine) without abnormal findings: Secondary | ICD-10-CM | POA: Insufficient documentation

## 2013-09-03 DIAGNOSIS — O09299 Supervision of pregnancy with other poor reproductive or obstetric history, unspecified trimester: Secondary | ICD-10-CM

## 2013-09-03 DIAGNOSIS — O99331 Smoking (tobacco) complicating pregnancy, first trimester: Secondary | ICD-10-CM

## 2013-09-03 DIAGNOSIS — O10019 Pre-existing essential hypertension complicating pregnancy, unspecified trimester: Secondary | ICD-10-CM | POA: Insufficient documentation

## 2013-09-03 DIAGNOSIS — O0941 Supervision of pregnancy with grand multiparity, first trimester: Secondary | ICD-10-CM

## 2013-09-03 DIAGNOSIS — O09291 Supervision of pregnancy with other poor reproductive or obstetric history, first trimester: Secondary | ICD-10-CM

## 2013-09-03 DIAGNOSIS — O99330 Smoking (tobacco) complicating pregnancy, unspecified trimester: Secondary | ICD-10-CM | POA: Insufficient documentation

## 2013-09-03 DIAGNOSIS — I10 Essential (primary) hypertension: Secondary | ICD-10-CM

## 2013-09-03 DIAGNOSIS — N831 Corpus luteum cyst of ovary, unspecified side: Secondary | ICD-10-CM | POA: Insufficient documentation

## 2013-09-03 DIAGNOSIS — Z113 Encounter for screening for infections with a predominantly sexual mode of transmission: Secondary | ICD-10-CM | POA: Insufficient documentation

## 2013-09-03 LAB — POCT URINALYSIS DIP (DEVICE)
Glucose, UA: NEGATIVE mg/dL
Nitrite: NEGATIVE
Urobilinogen, UA: 2 mg/dL — ABNORMAL HIGH (ref 0.0–1.0)

## 2013-09-03 MED ORDER — PRENATAL VITAMINS 0.8 MG PO TABS: 1.0000 | ORAL_TABLET | Freq: Every day | ORAL | Status: DC

## 2013-09-03 NOTE — Progress Notes (Signed)
Subjective:    Kylie Hicks is a [id number] at 107w4d being seen today for her first obstetrical visit.  Her obstetrical history is significant for the risk factors below:   Patient Active Problem List   Diagnosis Date Noted  . Benign essential hypertension antepartum 09/03/2013  . Smoking complicating pregnancy, antepartum 09/03/2013  . Grand multiparity, antepartum 09/03/2013  . Hypothyroidism   . Hypothyroid in pregnancy, antepartum 09/10/2011  . Smoker 09/10/2011  . History of postpartum depression, currently pregnant 09/10/2011  . Prior pregnancy with incompetent cervix, antepartum   . History of preterm labor, current pregnancy   . Hypertension     Patient does not intend to breast feed. Pregnancy history fully reviewed.  Patient reports no symptoms  Filed Vitals:   09/03/13 1106  BP: 127/82  Weight: 231 lb 9.6 oz (105.053 kg)    HISTORY: OB History  Gravida Para Term Preterm AB SAB TAB Ectopic Multiple Living  7 6 5 1      6     # Outcome Date GA Lbr Len/2nd Weight Sex Delivery Anes PTL Lv  7 CUR           6 TRM 10/19/11 [redacted]w[redacted]d 14:00 / 00:01 6 lb 8.2 oz (2.955 kg) F SVD None  Y     Comments: On 17P  5 TRM 11/24/10 [redacted]w[redacted]d  5 lb 10 oz (2.551 kg) F SVD EPI       Comments: cerclage, on 17P  4 TRM 04/15/08 [redacted]w[redacted]d 04:00 6 lb 3 oz (2.807 kg) F SVD EPI       Comments: no complications  3 PRE 03/29/07 [redacted]w[redacted]d 24:00 3 lb 2 oz (1.417 kg) M SVD EPI       Comments: cerclage 12 weeks, preterm labor 31 wks  2 TRM 02/06/05 [redacted]w[redacted]d 17:30 5 lb 3 oz (2.353 kg) M SVD EPI       Comments: postpartum depression, on lexapro x 6 months  1 TRM 06/08/03 [redacted]w[redacted]d 08:00 5 lb 10 oz (2.551 kg) M SVD EPI       Comments: possible IUGR, states worried about growtj     Past Medical History  Diagnosis Date  . Anemia     with pregnancies  . Incompetent cervix     cerclage  . History of preterm labor, current pregnancy   . Chlamydia   . Hypothyroidism     No meds  . Postpartum depression 2006  .  Hypertension     no meds   Past Surgical History  Procedure Laterality Date  . Cervical cerclage    . Pilonidal cyst / sinus excision    . Intrauterine device insertion     Family History  Problem Relation Age of Onset  . Hypertension Mother   . Cancer Sister      Exam  BP 127/82  Wt 231 lb 9.6 oz (105.053 kg)  BMI 42.35 kg/m2  LMP 07/05/2013 GENERAL: Well-developed, well-nourished female in no acute distress.  HEENT: Normocephalic, atraumatic. Sclerae anicteric.  NECK: Supple. Normal thyroid.  LUNGS: Clear to auscultation bilaterally.  HEART: Regular rate and rhythm. BREASTS: Symmetric in size. No masses, skin changes, nipple drainage, or lymphadenopathy. ABDOMEN: Soft, nontender, nondistended. No organomegaly. PELVIC: Normal external female genitalia. Vagina is pink and rugated.  Normal discharge. Normal multiparous cervix contour. Pap smear obtained. Uterus is normal in size. No adnexal mass or tenderness.  EXTREMITIES: No cyanosis, clubbing, or edema, 2+ distal pulses.  FHR: Unable to auscultate with doppler, patient will be  sent for clinic ultrasound FHR check   Assessment:    Pregnancy: [id number] Patient Active Problem List   Diagnosis Date Noted  . Benign essential hypertension antepartum 09/03/2013  . Smoking complicating pregnancy, antepartum 09/03/2013  . Grand multiparity, antepartum 09/03/2013  . Hypothyroidism   . Hypothyroid in pregnancy, antepartum 09/10/2011  . Smoker 09/10/2011  . History of postpartum depression, currently pregnant 09/10/2011  . Prior pregnancy with incompetent cervix, antepartum   . History of preterm labor, current pregnancy   . Hypertension      Plan:   Initial labs drawn. Baseline CHTN, thyroid labs also drawn, also given supplies for 24 hour urine collection Prenatal vitamins prescribed. Problem list reviewed and updated. Cerclage will be scheduled soon; patient will be contacted by surgical scheduler.  Will start 17P at 16  weeks. Genetic Screening discussed; referral to MFM ordered for first screen Ultrasound discussed; fetal survey: to be ordered later Offered help for smoking cessation, discussed risks of maternal-fetal morbidity and mortality due to smoking, patient declines any help for now. Follow up in 4weeks.  Jaynie Collins, MD, FACOG Attending Obstetrician & Gynecologist Faculty Practice, [facility] [place]

## 2013-09-03 NOTE — Patient Instructions (Addendum)
Pregnancy - First Trimester During sexual intercourse, millions of sperm go into the vagina. Only 1 sperm will penetrate and fertilize the female egg while it is in the Fallopian tube. One week later, the fertilized egg implants into the wall of the uterus. An embryo begins to develop into a baby. At 6 to 8 weeks, the eyes and face are formed and the heartbeat can be seen on ultrasound. At the end of 12 weeks (first trimester), all the baby's organs are formed. Now that you are pregnant, you will want to do everything you can to have a healthy baby. Two of the most important things are to get good prenatal care and follow your caregiver's instructions. Prenatal care is all the medical care you receive before the baby's birth. It is given to prevent, find, and treat problems during the pregnancy and childbirth. PRENATAL EXAMS  During prenatal visits, your weight, blood pressure, and urine are checked. This is done to make sure you are healthy and progressing normally during the pregnancy.  A pregnant woman should gain 25 to 35 pounds during the pregnancy. However, if you are overweight or underweight, your caregiver will advise you regarding your weight.  Your caregiver will ask and answer questions for you.  Blood work, cervical cultures, other necessary tests, and a Pap test are done during your prenatal exams. These tests are done to check on your health and the probable health of your baby. Tests are strongly recommended and done for HIV with your permission. This is the virus that causes AIDS. These tests are done because medicines can be given to help prevent your baby from being born with this infection should you have been infected without knowing it. Blood work is also used to find out your blood type, previous infections, and follow your blood levels (hemoglobin).  Low hemoglobin (anemia) is common during pregnancy. Iron and vitamins are given to help prevent this. Later in the pregnancy,  blood tests for diabetes will be done along with any other tests if any problems develop.  You may need other tests to make sure you and the baby are doing well. CHANGES DURING THE FIRST TRIMESTER  Your body goes through many changes during pregnancy. They vary from person to person. Talk to your caregiver about changes you notice and are concerned about. Changes can include:  Your menstrual period stops.  The egg and sperm carry the genes that determine what you look like. Genes from you and your partner are forming a baby. The female genes determine whether the baby is a boy or a girl.  Your body increases in girth and you may feel bloated.  Feeling sick to your stomach (nauseous) and throwing up (vomiting). If the vomiting is uncontrollable, call your caregiver.  Your breasts will begin to enlarge and become tender.  Your nipples may stick out more and become darker.  The need to urinate more. Painful urination may mean you have a bladder infection.  Tiring easily.  Loss of appetite.  Cravings for certain kinds of food.  At first, you may gain or lose a couple of pounds.  You may have changes in your emotions from day to day (excited to be pregnant or concerned something may go wrong with the pregnancy and baby).  You may have more vivid and strange dreams. HOME CARE INSTRUCTIONS   It is very important to avoid all smoking, alcohol and non-prescribed drugs during your pregnancy. These affect the formation and growth of the baby.   Avoid chemicals while pregnant to ensure the delivery of a healthy infant.  Start your prenatal visits by the 12th week of pregnancy. They are usually scheduled monthly at first, then more often in the last 2 months before delivery. Keep your caregiver's appointments. Follow your caregiver's instructions regarding medicine use, blood and lab tests, exercise, and diet.  During pregnancy, you are providing food for you and your baby. Eat regular,  well-balanced meals. Choose foods such as meat, fish, milk and other low fat dairy products, vegetables, fruits, and whole-grain breads and cereals. Your caregiver will tell you of the ideal weight gain.  You can help morning sickness by keeping soda crackers at the bedside. Eat a couple before arising in the morning. You may want to use the crackers without salt on them.  Eating 4 to 5 small meals rather than 3 large meals a day also may help the nausea and vomiting.  Drinking liquids between meals instead of during meals also seems to help nausea and vomiting.  A physical sexual relationship may be continued throughout pregnancy if there are no other problems. Problems may be early (premature) leaking of amniotic fluid from the membranes, vaginal bleeding, or belly (abdominal) pain.  Exercise regularly if there are no restrictions. Check with your caregiver or physical therapist if you are unsure of the safety of some of your exercises. Greater weight gain will occur in the last 2 trimesters of pregnancy. Exercising will help:  Control your weight.  Keep you in shape.  Prepare you for labor and delivery.  Help you lose your pregnancy weight after you deliver your baby.  Wear a good support or jogging bra for breast tenderness during pregnancy. This may help if worn during sleep too.  Ask when prenatal classes are available. Begin classes when they are offered.  Do not use hot tubs, steam rooms, or saunas.  Wear your seat belt when driving. This protects you and your baby if you are in an accident.  Avoid raw meat, uncooked cheese, cat litter boxes, and soil used by cats throughout the pregnancy. These carry germs that can cause birth defects in the baby.  The first trimester is a good time to visit your dentist for your dental health. Getting your teeth cleaned is okay. Use a softer toothbrush and brush gently during pregnancy.  Ask for help if you have financial, counseling, or  nutritional needs during pregnancy. Your caregiver will be able to offer counseling for these needs as well as refer you for other special needs.  Do not take any medicines or herbs unless told by your caregiver.  Inform your caregiver if there is any mental or physical domestic violence.  Make a list of emergency phone numbers of family, friends, hospital, and police and fire departments.  Write down your questions. Take them to your prenatal visit.  Do not douche.  Do not cross your legs.  If you have to stand for long periods of time, rotate you feet or take small steps in a circle.  You may have more vaginal secretions that may require a sanitary pad. Do not use tampons or scented sanitary pads. MEDICINES AND DRUG USE IN PREGNANCY  Take prenatal vitamins as directed. The vitamin should contain 1 milligram of folic acid. Keep all vitamins out of reach of children. Only a couple vitamins or tablets containing iron may be fatal to a baby or young child when ingested.  Avoid use of all medicines, including herbs, over-the-counter medicines, not   prescribed or suggested by your caregiver. Only take over-the-counter or prescription medicines for pain, discomfort, or fever as directed by your caregiver. Do not use aspirin, ibuprofen, or naproxen unless directed by your caregiver.  Let your caregiver also know about herbs you may be using.  Alcohol is related to a number of birth defects. This includes fetal alcohol syndrome. All alcohol, in any form, should be avoided completely. Smoking will cause low birth rate and premature babies.  Street or illegal drugs are very harmful to the baby. They are absolutely forbidden. A baby born to an addicted mother will be addicted at birth. The baby will go through the same withdrawal an adult does.  Let your caregiver know about any medicines that you have to take and for what reason you take them. SEEK MEDICAL CARE IF:  You have any concerns or  worries during your pregnancy. It is better to call with your questions if you feel they cannot wait, rather than worry about them. SEEK IMMEDIATE MEDICAL CARE IF:   An unexplained oral temperature above 102 F (38.9 C) develops, or as your caregiver suggests.  You have leaking of fluid from the vagina (birth canal). If leaking membranes are suspected, take your temperature and inform your caregiver of this when you call.  There is vaginal spotting or bleeding. Notify your caregiver of the amount and how many pads are used.  You develop a bad smelling vaginal discharge with a change in the color.  You continue to feel sick to your stomach (nauseated) and have no relief from remedies suggested. You vomit blood or coffee ground-like materials.  You lose more than 2 pounds of weight in 1 week.  You gain more than 2 pounds of weight in 1 week and you notice swelling of your face, hands, feet, or legs.  You gain 5 pounds or more in 1 week (even if you do not have swelling of your hands, face, legs, or feet).  You get exposed to [place] measles and have never had them.  You are exposed to fifth disease or chickenpox.  You develop belly (abdominal) pain. Round ligament discomfort is a common non-cancerous (benign) cause of abdominal pain in pregnancy. Your caregiver still must evaluate this.  You develop headache, fever, diarrhea, pain with urination, or shortness of breath.  You fall or are in a car accident or have any kind of trauma.  There is mental or physical violence in your home. Document Released: 11/27/2001 Document Revised: 08/27/2012 Document Reviewed: 05/31/2009 [facility] Patient Information 2014 [place], [place]. Hypertension During Pregnancy Hypertension is also called high blood pressure. It can occur at any time in life and during pregnancy. When you have hypertension, there is extra pressure inside your blood vessels that carry blood from the heart to the rest of your body  (arteries). Hypertension during pregnancy can cause problems for you and your baby. Your baby might not weigh as much as it should at birth or might be born early (premature). Very bad cases of hypertension during pregnancy can be life-threatening.  There are different types of hypertension during pregnancy.   Chronic hypertension. This happens when a woman has hypertension before pregnancy and it continues during pregnancy.  Gestational hypertension. This is when hypertension develops during pregnancy.  Preeclampsia or toxemia of pregnancy. This is a very serious type of hypertension that develops only during pregnancy. It is a disease that affects the whole body (systemic) and can be very dangerous for both mother and baby.  Gestational hypertension and preeclampsia usually go away after your baby is born. Blood pressure generally stabilizes within 6 weeks. Women who have hypertension during pregnancy have a greater chance of developing hypertension later in life or with future pregnancies. UNDERSTANDING BLOOD PRESSURE Blood pressure moves blood in your body. Sometimes, the force that moves the blood becomes too strong.  A blood pressure reading is given in 2 numbers and looks like a fraction.  The top number is called the systolic pressure. When your heart beats, it forces more blood to flow through the arteries. Pressure inside the arteries goes up.  The bottom number is the diastolic pressure. Pressure goes down between beats. That is when the heart is resting.  You may have hypertension if:  Your systolic blood pressure is above 140.  Your diastolic pressure is above 90. RISK FACTORS Some factors make you more likely to develop hypertension during pregnancy. Risk factors include:  Having hypertension before pregnancy.  Having hypertension during a previous pregnancy.  Being overweight.  Being older than 40.  Being pregnant with more than 1 baby (multiples).  Having diabetes  or kidney problems. SYMPTOMS Chronic and gestational hypertension may not cause symptoms. Preeclampsia has symptoms, which may include:  Increased protein in your urine. Your caregiver will check for this at every prenatal visit.  Swelling of your hands and face.  Rapid weight gain.  Headaches.  Visual changes.  Being bothered by light.  Abdominal pain, especially in the right upper area.  Chest pain.  Shortness of breath.  Increased reflexes.  Seizures. Seizures occur with a more severe form of preeclampsia, called eclampsia. DIAGNOSIS   You may be diagnosed with hypertension during pregnancy during a regular prenatal exam. At each visit, tests may include:  Blood pressure checks.  A urine test to check for protein in your urine.  The type of hypertension you are diagnosed with depends on when you developed it. It also depends on your specific blood pressure reading.  Developing hypertension before 20 weeks of pregnancy is consistent with chronic hypertension.  Developing hypertension after 20 weeks of pregnancy is consistent with gestational hypertension.  Hypertension with increased urinary protein is diagnosed as preeclampsia.  Blood pressure measurements that stay above 160 systolic or 110 diastolic are a sign of severe preeclampsia. TREATMENT Treatment for hypertension during pregnancy varies. Treatment depends on the type of hypertension and how serious it is.  If you take medicine for chronic hypertension, you may need to switch medicines.  Drugs called ACE inhibitors should not be taken during pregnancy.  Low-dose aspirin may be suggested for women who have risk factors for preeclampsia.  If you have gestational hypertension, you may need to take a blood pressure medicine that is safe during pregnancy. Your caregiver will recommend the appropriate medicine.  If you have severe preeclampsia, you may need to be in the hospital. Caregivers will watch you and  the baby very closely. You also may need to take medicine (magnesium sulfate) to prevent seizures and lower blood pressure.  Sometimes an early delivery is needed. This may be the case if the condition worsens. It would be done to protect you and the baby. The only cure for preeclampsia is delivery. HOME CARE INSTRUCTIONS  Schedule and keep all of your regular prenatal care.  Follow your caregiver's instructions for taking medicines. Tell your caregiver about all medicines you take. This includes over-the-counter medicines.  Eat as little salt as possible.  Get regular exercise.  Do not drink  alcohol.  Do not use tobacco products.  Do not drink products with caffeine.  Lie on your left side when resting.  Tell your doctor if you have any preeclampsia symptoms. SEEK IMMEDIATE MEDICAL CARE IF:  You have severe abdominal pain.  You have sudden swelling in the hands, ankles, or face.  You gain 4 pounds (1.8 kg) or more in 1 week.  You vomit repeatedly.  You have vaginal bleeding.  You do not feel the baby moving as much.  You have a headache.  You have blurred or double vision.  You have muscle twitching or spasms.  You have shortness of breath.  You have blue fingernails and lips.  You have blood in your urine. MAKE SURE YOU:  Understand these instructions.  Will watch your condition.  Will get help right away if you are not doing well. Document Released: 08/21/2011 Document Revised: 02/25/2012 Document Reviewed: 08/21/2011 [facility] Patient Information 2014 [place], [place].

## 2013-09-03 NOTE — Progress Notes (Signed)
Pulse: 80 Has history of preterm delivery at 31 weeks and had cerclage with last pregnancy.  Has been out of HCTZ for a few days. Currently being treated for BV.  Early 1 hr today due at 1210 Pt is fairly sure of her lmp of 07/05/13, however she did have some light bleeding around 8/20.

## 2013-09-03 NOTE — Progress Notes (Signed)
Patient had viability ultrasound today which does not demonstrate an IUP. Quant HCG drawn today, patient to return in 48 hours for repeat quant HCG

## 2013-09-03 NOTE — Progress Notes (Signed)
Informal [place] for FHR = unable to visualize IUP via abdominal probe.  Pt sent to [place] dept for vaginal probe [place] to confirm viability.  Dr. Macon Large notified.

## 2013-09-03 NOTE — Progress Notes (Signed)
Nutrition note: 1st visit consult Pt has h/o obesity & HTN. Pt reports eating 2 meals/d & 'snacks all day' per pt. Pt reports drinking 6x 20oz Pepsi & 6 large sweet tea/ d and limited water. Pt is not taking PNV yet but plans to start today. Pt reports nausea but no vomiting or heartburn. Pt received verbal & written education on general nutrition during pregnancy. Encouraged pt to decrease sugar-sweetened caffeinated beverages. Encouraged pt to include a protein source with meals & snacks. Discussed benefits & importance of BF/ pumping.  Discussed wt gain goals of 11-20# or 0.5#/wk in 2nd & 3rd trimester. Pt agrees to start taking PNV & work on decreasing Pepsi (pt's goal is 2x/d) & sweet tea (pt's goal is 0/d). Pt does not have WIC but plans to apply. Pt does not plan to BF but is open to try pumping. F/u if referred Blondell Reveal, MS, RD, LDN

## 2013-09-04 LAB — OBSTETRIC PANEL
Hepatitis B Surface Ag: NEGATIVE
Lymphocytes Relative: 42 % (ref 12–46)
Lymphs Abs: 2.3 10*3/uL (ref 0.7–4.0)
Neutro Abs: 2.5 10*3/uL (ref 1.7–7.7)
Neutrophils Relative %: 47 % (ref 43–77)
Platelets: 422 10*3/uL — ABNORMAL HIGH (ref 150–400)
RBC: 4.42 MIL/uL (ref 3.87–5.11)
Rubella: 4.53 Index — ABNORMAL HIGH (ref <0.90)
WBC: 5.5 10*3/uL (ref 4.0–10.5)

## 2013-09-04 LAB — COMPREHENSIVE METABOLIC PANEL
ALT: 15 U/L (ref 0–35)
CO2: 20 mEq/L (ref 19–32)
Chloride: 106 mEq/L (ref 96–112)
Sodium: 137 mEq/L (ref 135–145)
Total Bilirubin: 0.2 mg/dL — ABNORMAL LOW (ref 0.3–1.2)
Total Protein: 6.8 g/dL (ref 6.0–8.3)

## 2013-09-04 LAB — GLUCOSE TOLERANCE, 1 HOUR (50G) W/O FASTING: Glucose, 1 Hour GTT: 153 mg/dL — ABNORMAL HIGH (ref 70–140)

## 2013-09-04 LAB — PROTEIN / CREATININE RATIO, URINE: Creatinine, Urine: 366.6 mg/dL

## 2013-09-04 LAB — HCG, QUANTITATIVE, PREGNANCY: hCG, Beta Chain, Quant, S: 658.5 m[IU]/mL

## 2013-09-04 LAB — T4, FREE: Free T4: 0.85 ng/dL (ref 0.80–1.80)

## 2013-09-05 ENCOUNTER — Inpatient Hospital Stay (EMERGENCY_DEPARTMENT_HOSPITAL)
Admission: AD | Admit: 2013-09-05 | Discharge: 2013-09-05 | Disposition: A | Discharge status: Home / Self Care | Payer: Medicaid Other | Source: Ambulatory Visit | Attending: Obstetrics & Gynecology | Admitting: Obstetrics & Gynecology

## 2013-09-05 ENCOUNTER — Encounter (HOSPITAL_COMMUNITY): Payer: Self-pay | Admitting: *Deleted

## 2013-09-05 ENCOUNTER — Inpatient Hospital Stay (HOSPITAL_COMMUNITY)
Admission: AD | Admit: 2013-09-05 | Discharge: 2013-09-05 | Disposition: A | Discharge status: Home / Self Care | Payer: Medicaid Other | Source: Ambulatory Visit | Attending: Obstetrics & Gynecology | Admitting: Obstetrics & Gynecology

## 2013-09-05 DIAGNOSIS — O26899 Other specified pregnancy related conditions, unspecified trimester: Secondary | ICD-10-CM

## 2013-09-05 DIAGNOSIS — O99891 Other specified diseases and conditions complicating pregnancy: Secondary | ICD-10-CM | POA: Insufficient documentation

## 2013-09-05 DIAGNOSIS — R110 Nausea: Secondary | ICD-10-CM

## 2013-09-05 DIAGNOSIS — O0281 Inappropriate change in quantitative human chorionic gonadotropin (hCG) in early pregnancy: Secondary | ICD-10-CM

## 2013-09-05 DIAGNOSIS — Z3201 Encounter for pregnancy test, result positive: Secondary | ICD-10-CM

## 2013-09-05 DIAGNOSIS — K219 Gastro-esophageal reflux disease without esophagitis: Secondary | ICD-10-CM

## 2013-09-05 DIAGNOSIS — R109 Unspecified abdominal pain: Secondary | ICD-10-CM | POA: Insufficient documentation

## 2013-09-05 LAB — URINALYSIS, ROUTINE W REFLEX MICROSCOPIC
Ketones, ur: 15 mg/dL — AB
Leukocytes, UA: NEGATIVE
Nitrite: NEGATIVE
Protein, ur: NEGATIVE mg/dL
Urobilinogen, UA: >8 mg/dL — ABNORMAL HIGH (ref 0.0–1.0)
pH: 6 (ref 5.0–8.0)

## 2013-09-05 LAB — HCG, QUANTITATIVE, PREGNANCY: hCG, Beta Chain, Quant, S: 850 m[IU]/mL — ABNORMAL HIGH (ref <5)

## 2013-09-05 MED ORDER — GI COCKTAIL ~~LOC~~
30.0000 mL | Freq: Once | ORAL | Status: AC
  Administered 2013-09-05: 30 mL via ORAL
  Filled 2013-09-05: qty 30

## 2013-09-05 MED ORDER — OMEPRAZOLE 20 MG PO CPDR: 20.0000 mg | DELAYED_RELEASE_CAPSULE | Freq: Every day | ORAL | Status: DC

## 2013-09-05 NOTE — MAU Provider Note (Signed)
HPI: Kylie Hicks is 27 y.o. female; [phone] [redacted]w[redacted]d who presents to MAU for a repeat quant. She denies bleeding or pain at this time. Quant on 9/18: 658.5, Quant on 9/20: 850. Pt was seen for her first prenatal appointment on 9/18.    Objective:  GENERAL: Well-developed, well-nourished female in no acute distress.  HEENT: Normocephalic, atraumatic.   LUNGS: Effort normal HEART: Regular rate  SKIN: Warm, dry and without erythema PSYCH: Normal mood and affect  Filed Vitals:   09/05/13 1610 09/05/13 1639  BP: 127/84 127/84  Pulse:  92  Resp:  18    Results for orders placed during the hospital encounter of 09/05/13 (from the past 24 hour(s))  HCG, QUANTITATIVE, PREGNANCY     Status: Abnormal   Collection Time    09/05/13  3:42 PM      Result Value Range   hCG, Beta Chain, Quant, S 850 (*) <5 mIU/mL   Consulted with Dr. Debroah Loop; ok for patient to follow up in MAU in 48 hours for repeat Quant.  A: Inappropriate rise in Beta Hcg levels  P: Discharge home Return to MAU with worsening symptoms Pelvic rest Ectopic precautions discussed. Return to MAU on Monday 9/22 for repeat Quant.  Support given  Iona Hansen Cephas Revard, NP 09/05/2013 4:44 PM

## 2013-09-05 NOTE — Progress Notes (Signed)
Written and verbal d/c instructions given and understanding voiced. 

## 2013-09-05 NOTE — MAU Note (Signed)
Having sharp pains in lower abd which comes and goes. Having dull, sore pain in upper abd which is not as bad. Seen here earlier today for repeat BHCG

## 2013-09-05 NOTE — MAU Provider Note (Signed)
History     CSN: [id number]  Arrival date and time: 09/05/13 2029   First Provider Initiated Contact with Patient 09/05/13 2053      No chief complaint on file.  HPI This is a 27 y.o. female at [redacted]w[redacted]d by LMP who was seen here a few hours ago for followup quant HCG which did not double appropriately.  She returns with c/o upper abdominal bloating and acid reflux. She also has "a fluttery feeling " in lower abdomen, not really pain she says. No bleeding, but pt wants me to look with a speculum.  OB History   Grav Para Term Preterm Abortions TAB SAB Ect Mult Living   7 6 5 1      6       Past Medical History  Diagnosis Date  . Anemia     with pregnancies  . Incompetent cervix     cerclage  . History of preterm labor, current pregnancy   . Chlamydia   . Hypothyroidism     No meds  . Postpartum depression 2006  . Hypertension     no meds    Past Surgical History  Procedure Laterality Date  . Cervical cerclage    . Pilonidal cyst / sinus excision    . Intrauterine device insertion      Family History  Problem Relation Age of Onset  . Hypertension Mother   . Cancer Sister     History  Substance Use Topics  . Smoking status: Current Every Day Smoker -- 0.25 packs/day for 3 years    Types: Cigarettes  . Smokeless tobacco: Never Used  . Alcohol Use: No    Allergies: No Known Allergies  Prescriptions prior to admission  Medication Sig Dispense Refill  . metroNIDAZOLE (FLAGYL) 500 MG tablet Take 1 tablet (500 mg total) by mouth 2 (two) times daily.  14 tablet  0  . ondansetron (ZOFRAN-ODT) 4 MG disintegrating tablet Take 1 tablet (4 mg total) by mouth every 6 (six) hours as needed for nausea. PRN for nausea or vomiting  12 tablet  0  . Prenatal Multivit-Min-Fe-FA (PRENATAL VITAMINS) 0.8 MG tablet Take 1 tablet by mouth daily.  30 tablet  12    Review of Systems  Constitutional: Negative for fever, chills and malaise/fatigue.  Gastrointestinal: Positive for nausea  and abdominal pain. Negative for vomiting, diarrhea and constipation.  Genitourinary: Negative for dysuria.  Neurological: Negative for dizziness and weakness.   Physical Exam   Blood pressure 137/86, pulse 101, temperature 99 F (37.2 C), resp. rate 20, height 5\' 2"  (1.575 m), weight 106.232 kg (234 lb 3.2 oz), last menstrual period 07/05/2013, SpO2 100.00%.  Physical Exam  Constitutional: She is oriented to person, place, and time. She appears well-developed and well-nourished. No distress (but seems sad).  Cardiovascular: Normal rate.   Respiratory: Effort normal.  GI: Soft. She exhibits no distension and no mass (exam limited by habitus). There is no tenderness. There is no rebound and no guarding.  Genitourinary: Vagina normal and uterus normal. No vaginal discharge found.  Cervix long and closed No blood in vault   Musculoskeletal: Normal range of motion.  Neurological: She is alert and oriented to person, place, and time.  Skin: Skin is warm and dry.  Psychiatric: She has a normal mood and affect.    MAU Course  Procedures  MDM Will give a GI cocktail for immediate relief and Rx Protonix for GERD for home use Discussed possibility of normal development of pregnancy  as well as our suspicion that this may be an ectopic or missed ab.   Discussed possible options of expectant vs MTX/Cytotec treatment. >> felt much better after GI cocktail  Assessment and Plan  A:  Pregnancy at [redacted]w[redacted]d      Cramping      GERD  P:  Discharge home       Rx Prilosec      Follow up as directed in 2 days  [facility] 09/05/2013, 9:23 PM

## 2013-09-05 NOTE — MAU Note (Signed)
Pt presents for repeat quant. Denies any pain or bleeding.

## 2013-09-06 LAB — CULTURE, OB URINE

## 2013-09-07 ENCOUNTER — Inpatient Hospital Stay (HOSPITAL_COMMUNITY)
Admission: AD | Admit: 2013-09-07 | Discharge: 2013-09-07 | Disposition: A | Discharge status: Home / Self Care | Payer: Medicaid Other | Source: Ambulatory Visit | Attending: Obstetrics and Gynecology | Admitting: Obstetrics and Gynecology

## 2013-09-07 DIAGNOSIS — Z3201 Encounter for pregnancy test, result positive: Secondary | ICD-10-CM

## 2013-09-07 DIAGNOSIS — O99891 Other specified diseases and conditions complicating pregnancy: Secondary | ICD-10-CM | POA: Insufficient documentation

## 2013-09-07 DIAGNOSIS — O0281 Inappropriate change in quantitative human chorionic gonadotropin (hCG) in early pregnancy: Secondary | ICD-10-CM | POA: Insufficient documentation

## 2013-09-07 LAB — URINE CULTURE: Colony Count: 30000

## 2013-09-07 NOTE — MAU Note (Signed)
Patient to MAU for repeat BHCG. Patient denies bleeding or pain.  

## 2013-09-07 NOTE — MAU Provider Note (Signed)
Ms. Kylie Hicks is a 27 y.o. [phone] at [redacted]w[redacted]d who presents to MAU today for follow-up quant hCG after 48 hours. Patient denies vaginal bleeding or abdominal pain today.   BP 129/85  Pulse 83  Temp(Src) 98.9 F (37.2 C) (Oral)  Resp 16  SpO2 99%  LMP 07/05/2013 GENERAL: Well-developed, well-nourished female in no acute distress.  HEENT: Normocephalic, atraumatic.   LUNGS: Effort normal HEART: Regular rate  SKIN: Warm, dry and without erythema PSYCH: Normal mood and affect  Results for orders placed during the hospital encounter of 09/07/13 (from the past 24 hour(s))  HCG, QUANTITATIVE, PREGNANCY     Status: Abnormal   Collection Time    09/07/13 10:22 AM      Result Value Range   hCG, Beta Chain, Quant, S 1687 (*) <5 mIU/mL    A: Appropriate rise in quant hCG after 48 hours  P: Discharge home Bleeding/ectopic precautions discussed Patient scheduled for follow-up US on 09/14/13 @ 10:45am Patient will follow-up in MAU for results  Patient may return to MAU as needed sooner  Freddi Starr, PA-C 09/07/2013 11:34 AM

## 2013-09-08 ENCOUNTER — Telehealth: Payer: Self-pay | Admitting: General Practice

## 2013-09-08 NOTE — Telephone Encounter (Signed)
Called patient and informed her of results & recommendations. Patient verbalizes understanding. Told patient we would wait to do the 3 hr gtt until her next OB appt on 10/9 and to come around 8am and fasting for the test. Patient verbalizes understanding to all & had no further questions

## 2013-09-08 NOTE — Telephone Encounter (Signed)
Message copied by Kathee Delton on Tue Sep 08, 2013 11:14 AM ------      Message from: Jaynie Collins A      Created: Fri Sep 04, 2013  4:56 PM       1 hr GTT 153.  Please schedule for 3 hr GTT once pregnancy is confirmed. ------

## 2013-09-09 NOTE — MAU Provider Note (Signed)
Attestation of Attending Supervision of Advanced Practitioner (CNM/NP): Evaluation and management procedures were performed by the Advanced Practitioner under my supervision and collaboration.  I have reviewed the Advanced Practitioner's note and chart, and I agree with the management and plan.  Damica Gravlin 09/09/2013 10:29 AM   

## 2013-09-12 ENCOUNTER — Encounter: Payer: Self-pay | Admitting: *Deleted

## 2013-09-14 ENCOUNTER — Ambulatory Visit (HOSPITAL_COMMUNITY)
Admit: 2013-09-14 | Discharge: 2013-09-14 | Disposition: A | Discharge status: Home / Self Care | Payer: Medicaid Other | Attending: Medical | Admitting: Medical

## 2013-09-14 DIAGNOSIS — O00109 Unspecified tubal pregnancy without intrauterine pregnancy: Secondary | ICD-10-CM | POA: Insufficient documentation

## 2013-09-14 DIAGNOSIS — O99891 Other specified diseases and conditions complicating pregnancy: Secondary | ICD-10-CM | POA: Insufficient documentation

## 2013-09-16 ENCOUNTER — Telehealth: Payer: Self-pay | Admitting: General Practice

## 2013-09-16 NOTE — Telephone Encounter (Signed)
Called patient and informed her of normal progressing pregnancy based off her ultrasound on 9/29 and that she will continue getting her prenatal care with [place] and her next appt is on 10/9 and to come at 8 that day preparing for the 3 hr glucose test. Patient verbalized understanding to all and had no further questions

## 2013-09-16 NOTE — Telephone Encounter (Signed)
Message copied by Kathee Delton on Wed Sep 16, 2013 10:44 AM ------      Message from: Freddi Starr      Created: Mon Sep 14, 2013  5:11 PM       Patient has already started care in the clinic. Please inform her that she has IUP on [place] from 09/14/13 and that she should continue to follow-up for routine prenatal care as scheduled. Looks like she will also need a 3 hr GTT per previous nursing notes. Thanks!            Raynelle Fanning ------

## 2013-09-24 ENCOUNTER — Ambulatory Visit (INDEPENDENT_AMBULATORY_CARE_PROVIDER_SITE_OTHER): Payer: Medicaid Other | Admitting: Family Medicine

## 2013-09-24 ENCOUNTER — Other Ambulatory Visit: Payer: Self-pay | Admitting: Family Medicine

## 2013-09-24 ENCOUNTER — Encounter: Payer: Self-pay | Admitting: Family Medicine

## 2013-09-24 VITALS — BP 125/87 | Wt 234.9 lb

## 2013-09-24 DIAGNOSIS — O09211 Supervision of pregnancy with history of pre-term labor, first trimester: Secondary | ICD-10-CM

## 2013-09-24 DIAGNOSIS — O0993 Supervision of high risk pregnancy, unspecified, third trimester: Secondary | ICD-10-CM | POA: Insufficient documentation

## 2013-09-24 DIAGNOSIS — Z3682 Encounter for antenatal screening for nuchal translucency: Secondary | ICD-10-CM

## 2013-09-24 DIAGNOSIS — O0991 Supervision of high risk pregnancy, unspecified, first trimester: Secondary | ICD-10-CM

## 2013-09-24 DIAGNOSIS — O09219 Supervision of pregnancy with history of pre-term labor, unspecified trimester: Secondary | ICD-10-CM

## 2013-09-24 LAB — POCT URINALYSIS DIP (DEVICE)
Nitrite: NEGATIVE
Urobilinogen, UA: 1 mg/dL (ref 0.0–1.0)
pH: 6 (ref 5.0–8.0)

## 2013-09-24 MED ORDER — DOXYLAMINE-PYRIDOXINE 10-10 MG PO TBEC: 1.0000 | DELAYED_RELEASE_TABLET | Freq: Two times a day (BID) | ORAL | Status: DC

## 2013-09-24 NOTE — Patient Instructions (Signed)

## 2013-09-24 NOTE — Progress Notes (Signed)
First Screen scheduled 10/22/13 at 830 am in [facility].

## 2013-09-24 NOTE — Progress Notes (Signed)
   Subjective:    Kylie Hicks is a 27 y.o. female being seen today for her obstetrical visit. She is at [redacted]w[redacted]d gestation. Patient reports nausea, no bleeding, no contractions, no cramping and no leaking. Fetal movement: normal.  Menstrual History: OB History   Grav Para Term Preterm Abortions TAB SAB Ect Mult Living   7 6 5 1      6       Patient's last menstrual period was 07/05/2013.    The following portions of the patient's history were reviewed and updated as appropriate: allergies, current medications, past family history, past medical history, past social history, past surgical history and problem list.  Review of Systems Pertinent items are noted in HPI.   Objective:     BP 125/87  Wt 106.55 kg (234 lb 14.4 oz)  BMI 42.95 kg/m2  LMP 07/05/2013 Size equal to dates   No FHR based on 8wk   NAD VSS                                       Assessment:  Kylie Hicks is a 27 y.o. G7P5106 at [redacted]w[redacted]d by L=6 here for ROB visit.  Discussed with Patient:  - New OB labs were reviewed. - pt desires early trimester screening - RTC for any VB, regular, painful cramps/ctxs occurring at a rate of >2/10 min, fever (100.5 or higher), n/v/d, any pain that is unresolving or worsening. - Routine precautions(SAB, depression, infection s/s) - RTC in 4 weeks for next appt. - diclegis for nausea - Will start 17P at 16 weeks - schedule cerclage  To Do: 1. 3hr glucola today 2. Declines flu shot 3. Desires early screening 4. diclegis for nausea  [ ]  Vaccines: Flu:  Tdap:  [ ]  BCM: mirena  Edu: [ x] PTL precautions; [ ]  BF class; [ ]  childbirth class; [ ]   BF counseling;

## 2013-09-24 NOTE — Progress Notes (Signed)
Pulse 83 Pt doesn't want flu shot today.

## 2013-10-05 ENCOUNTER — Encounter: Payer: Self-pay | Admitting: *Deleted

## 2013-10-13 ENCOUNTER — Encounter (HOSPITAL_COMMUNITY): Payer: Self-pay | Admitting: Emergency Medicine

## 2013-10-13 ENCOUNTER — Emergency Department (HOSPITAL_COMMUNITY)
Admission: EM | Admit: 2013-10-13 | Discharge: 2013-10-14 | Disposition: A | Discharge status: Home / Self Care | Payer: Medicaid Other | Attending: Emergency Medicine | Admitting: Emergency Medicine

## 2013-10-13 DIAGNOSIS — Z8639 Personal history of other endocrine, nutritional and metabolic disease: Secondary | ICD-10-CM | POA: Insufficient documentation

## 2013-10-13 DIAGNOSIS — L03313 Cellulitis of chest wall: Secondary | ICD-10-CM

## 2013-10-13 DIAGNOSIS — O169 Unspecified maternal hypertension, unspecified trimester: Secondary | ICD-10-CM | POA: Insufficient documentation

## 2013-10-13 DIAGNOSIS — O99330 Smoking (tobacco) complicating pregnancy, unspecified trimester: Secondary | ICD-10-CM | POA: Insufficient documentation

## 2013-10-13 DIAGNOSIS — L02219 Cutaneous abscess of trunk, unspecified: Secondary | ICD-10-CM | POA: Insufficient documentation

## 2013-10-13 DIAGNOSIS — Z79899 Other long term (current) drug therapy: Secondary | ICD-10-CM | POA: Insufficient documentation

## 2013-10-13 DIAGNOSIS — O9989 Other specified diseases and conditions complicating pregnancy, childbirth and the puerperium: Secondary | ICD-10-CM | POA: Insufficient documentation

## 2013-10-13 DIAGNOSIS — Z862 Personal history of diseases of the blood and blood-forming organs and certain disorders involving the immune mechanism: Secondary | ICD-10-CM | POA: Insufficient documentation

## 2013-10-13 DIAGNOSIS — Z8619 Personal history of other infectious and parasitic diseases: Secondary | ICD-10-CM | POA: Insufficient documentation

## 2013-10-13 DIAGNOSIS — Z8742 Personal history of other diseases of the female genital tract: Secondary | ICD-10-CM | POA: Insufficient documentation

## 2013-10-13 NOTE — ED Notes (Addendum)
Pt. reports " knot" at left lateral breast onset today , denies injury or nipple discharge , pain worse when palpated . Pt. stated she is 3 months pregnant ( G7P6) with regular prenatal check up .

## 2013-10-14 MED ORDER — CEPHALEXIN 500 MG PO CAPS: 500.0000 mg | ORAL_CAPSULE | Freq: Four times a day (QID) | ORAL | Status: DC

## 2013-10-14 MED ORDER — CEPHALEXIN 250 MG PO CAPS
500.0000 mg | ORAL_CAPSULE | Freq: Once | ORAL | Status: AC
  Administered 2013-10-14: 500 mg via ORAL
  Filled 2013-10-14: qty 2

## 2013-10-14 NOTE — ED Provider Notes (Signed)
Medical screening examination/treatment/procedure(s) were performed by non-physician practitioner and as supervising physician I was immediately available for consultation/collaboration.  EKG Interpretation   None         Lyanne Co, MD 10/14/13 [phone]

## 2013-10-14 NOTE — ED Provider Notes (Signed)
CSN: [id number]     Arrival date & time 10/13/13  2202 History   First MD Initiated Contact with Patient 10/13/13 2348     Chief Complaint  Patient presents with  . Breast Mass   (Consider location/radiation/quality/duration/timing/severity/associated sxs/prior Treatment) HPI History provided by pt.   Pt 3 months pregnant.  Noticed a mass inferior to L breast yesterday am.  Describes as sore and irritating.  Non-draining.  No associated fever or rash.  Denies trauma.  H/o pilonidal cyst.  Past Medical History  Diagnosis Date  . Anemia     with pregnancies  . Incompetent cervix     cerclage  . History of preterm labor, current pregnancy   . Chlamydia   . Hypothyroidism     No meds  . Postpartum depression 2006  . Hypertension     no meds   Past Surgical History  Procedure Laterality Date  . Cervical cerclage    . Pilonidal cyst / sinus excision    . Intrauterine device insertion     Family History  Problem Relation Age of Onset  . Hypertension Mother   . Cancer Sister    History  Substance Use Topics  . Smoking status: Current Every Day Smoker -- 0.25 packs/day for 3 years    Types: Cigarettes  . Smokeless tobacco: Never Used  . Alcohol Use: No   OB History   Grav Para Term Preterm Abortions TAB SAB Ect Mult Living   7 6 5 1      6      Review of Systems  All other systems reviewed and are negative.    Allergies  Review of patient's allergies indicates no known allergies.  Home Medications   Current Outpatient Rx  Name  Route  Sig  Dispense  Refill  . omeprazole (PRILOSEC) 20 MG capsule   Oral   Take 1 capsule (20 mg total) by mouth daily.   30 capsule   0   . ondansetron (ZOFRAN-ODT) 4 MG disintegrating tablet   Oral   Take 1 tablet (4 mg total) by mouth every 6 (six) hours as needed for nausea. PRN for nausea or vomiting   12 tablet   0   . Prenatal Multivit-Min-Fe-FA (PRENATAL VITAMINS) 0.8 MG tablet   Oral   Take 1 tablet by mouth daily.  30 tablet   12    BP 137/70  Pulse 91  Temp(Src) 98.7 F (37.1 C) (Oral)  Resp 14  Wt 237 lb (107.502 kg)  BMI 43.34 kg/m2  SpO2 98%  LMP 07/05/2013 Physical Exam  Nursing note and vitals reviewed. Constitutional: She is oriented to person, place, and time. She appears well-developed and well-nourished. No distress.  HENT:  Head: Normocephalic and atraumatic.  Eyes:  Normal appearance  Neck: Normal range of motion.  Pulmonary/Chest: Effort normal.  2.5cm area of erythema, induration and tenderness just inferior to L breast on chest wall.  Non-fluctuant and no drainage.   Musculoskeletal: Normal range of motion.  Neurological: She is alert and oriented to person, place, and time.  Psychiatric: She has a normal mood and affect. Her behavior is normal.    ED Course  Procedures (including critical care time) INCISION AND DRAINAGE Performed by: Ruby Cola E Consent: Verbal consent obtained. Risks and benefits: risks, benefits and alternatives were discussed Type: abscess  Body area: L chest wall  Needle aspiration  Anesthesia: local infiltration  Local anesthetic: lidocaine 2% w/out epinephrine  Anesthetic total: 2 ml  Complexity:  simple  Drainage: blood only  Patient tolerance: Patient tolerated the procedure well with no immediate complications.    Labs Review Labs Reviewed - No data to display Imaging Review No results found.  EKG Interpretation   None       MDM  No diagnosis found. 27yo pregnant F presents w/ pain under L breast.  Exam consistent w/ cellulitis.  No drainage w/ needle aspiration.  Pt received first dose of keflex in ED. Margins drawn by nursing staff.  Recommended tylenol for pain and f/u with her OB.  Return precautions discussed. 12:24 AM     Otilio Miu, PA-C 10/14/13 0025

## 2013-10-15 ENCOUNTER — Encounter (HOSPITAL_COMMUNITY): Payer: Self-pay | Admitting: Emergency Medicine

## 2013-10-15 ENCOUNTER — Emergency Department (HOSPITAL_COMMUNITY)
Admission: EM | Admit: 2013-10-15 | Discharge: 2013-10-15 | Disposition: A | Discharge status: Home / Self Care | Payer: Medicaid Other | Attending: Emergency Medicine | Admitting: Emergency Medicine

## 2013-10-15 DIAGNOSIS — Z79899 Other long term (current) drug therapy: Secondary | ICD-10-CM | POA: Insufficient documentation

## 2013-10-15 DIAGNOSIS — Z862 Personal history of diseases of the blood and blood-forming organs and certain disorders involving the immune mechanism: Secondary | ICD-10-CM | POA: Insufficient documentation

## 2013-10-15 DIAGNOSIS — N611 Abscess of the breast and nipple: Secondary | ICD-10-CM

## 2013-10-15 DIAGNOSIS — O169 Unspecified maternal hypertension, unspecified trimester: Secondary | ICD-10-CM | POA: Insufficient documentation

## 2013-10-15 DIAGNOSIS — Z3490 Encounter for supervision of normal pregnancy, unspecified, unspecified trimester: Secondary | ICD-10-CM

## 2013-10-15 DIAGNOSIS — R21 Rash and other nonspecific skin eruption: Secondary | ICD-10-CM | POA: Insufficient documentation

## 2013-10-15 DIAGNOSIS — O9989 Other specified diseases and conditions complicating pregnancy, childbirth and the puerperium: Secondary | ICD-10-CM | POA: Insufficient documentation

## 2013-10-15 DIAGNOSIS — Z8751 Personal history of pre-term labor: Secondary | ICD-10-CM | POA: Insufficient documentation

## 2013-10-15 DIAGNOSIS — Z8639 Personal history of other endocrine, nutritional and metabolic disease: Secondary | ICD-10-CM | POA: Insufficient documentation

## 2013-10-15 DIAGNOSIS — Z8619 Personal history of other infectious and parasitic diseases: Secondary | ICD-10-CM | POA: Insufficient documentation

## 2013-10-15 DIAGNOSIS — Z792 Long term (current) use of antibiotics: Secondary | ICD-10-CM | POA: Insufficient documentation

## 2013-10-15 DIAGNOSIS — Z8742 Personal history of other diseases of the female genital tract: Secondary | ICD-10-CM | POA: Insufficient documentation

## 2013-10-15 DIAGNOSIS — L02219 Cutaneous abscess of trunk, unspecified: Secondary | ICD-10-CM | POA: Insufficient documentation

## 2013-10-15 DIAGNOSIS — I10 Essential (primary) hypertension: Secondary | ICD-10-CM | POA: Insufficient documentation

## 2013-10-15 DIAGNOSIS — O99019 Anemia complicating pregnancy, unspecified trimester: Secondary | ICD-10-CM | POA: Insufficient documentation

## 2013-10-15 DIAGNOSIS — O99330 Smoking (tobacco) complicating pregnancy, unspecified trimester: Secondary | ICD-10-CM | POA: Insufficient documentation

## 2013-10-15 DIAGNOSIS — D649 Anemia, unspecified: Secondary | ICD-10-CM | POA: Insufficient documentation

## 2013-10-15 NOTE — ED Notes (Signed)
Pt reports being seen on 10/28 for abscess under left breast. Its gotten larger and more painful. No acute distress noted at triage.

## 2013-10-15 NOTE — ED Provider Notes (Signed)
CSN: [id number]     Arrival date & time 10/15/13  1606 History  This chart was scribed for non-physician practitioner working with Flint Melter, MD by Ronal Fear, ED scribe. This patient was seen in room TR07C/TR07C and the patient's care was started at 7:10 PM.   Chief Complaint  Patient presents with  . Abscess    Patient is a 27 y.o. female presenting with abscess. The history is provided by the patient and medical records. No language interpreter was used.  Abscess Associated symptoms: no fever, no nausea and no vomiting     Kylie Hicks is a 27 y.o. female  with a hx of anemia, currently 3 months pregnant presents to the Emergency Department complaining of gradual, persistent, progressively worsening mass to the inferior left breast onset 2 days ago. Associated symptoms include pain, swelling and increased warmth at the site. Patient was seen in the emergency department yesterday. I and D. was attempted without. No drainage. Patient was discharged home with Keflex. She reports that the pain has gotten increasingly worse as well as the size.  She continues to deny fever, chills, abdominal pain, abdominal cramping, nausea, vomiting, weakness, dizziness, syncope.    Past Medical History  Diagnosis Date  . Anemia     with pregnancies  . Incompetent cervix     cerclage  . History of preterm labor, current pregnancy   . Chlamydia   . Hypothyroidism     No meds  . Postpartum depression 2006  . Hypertension     no meds   Past Surgical History  Procedure Laterality Date  . Cervical cerclage    . Pilonidal cyst / sinus excision    . Intrauterine device insertion     Family History  Problem Relation Age of Onset  . Hypertension Mother   . Cancer Sister    History  Substance Use Topics  . Smoking status: Current Every Day Smoker -- 0.25 packs/day for 3 years    Types: Cigarettes  . Smokeless tobacco: Never Used  . Alcohol Use: No   OB History   Grav Para Term Preterm  Abortions TAB SAB Ect Mult Living   7 6 5 1      6      Review of Systems  Constitutional: Negative for fever and chills.  Gastrointestinal: Negative for nausea and vomiting.  Skin: Positive for rash.  Allergic/Immunologic: Negative for immunocompromised state.  Hematological: Does not bruise/bleed easily.  Psychiatric/Behavioral: The patient is not nervous/anxious.   All other systems reviewed and are negative.    Allergies  Review of patient's allergies indicates no known allergies.  Home Medications   Current Outpatient Rx  Name  Route  Sig  Dispense  Refill  . acetaminophen (TYLENOL) 500 MG tablet   Oral   Take 1,000 mg by mouth daily as needed for pain.         . cephALEXin (KEFLEX) 500 MG capsule   Oral   Take 1 capsule (500 mg total) by mouth 4 (four) times daily.   28 capsule   0   . omeprazole (PRILOSEC) 20 MG capsule   Oral   Take 20 mg by mouth at bedtime.         . ondansetron (ZOFRAN-ODT) 4 MG disintegrating tablet   Oral   Take 4 mg by mouth daily as needed for nausea.         . Prenatal Multivit-Min-Fe-FA (PRENATAL VITAMINS) 0.8 MG tablet   Oral  Take 1 tablet by mouth daily.   30 tablet   12    BP 128/77  Pulse 80  Temp(Src) 99.5 F (37.5 C) (Oral)  Resp 18  SpO2 99%  LMP 07/05/2013 Physical Exam  Nursing note and vitals reviewed. Constitutional: She is oriented to person, place, and time. She appears well-developed and well-nourished. No distress.  HENT:  Head: Normocephalic and atraumatic.  Eyes: Conjunctivae are normal. No scleral icterus.  Neck: Normal range of motion.  Cardiovascular: Normal rate, regular rhythm, normal heart sounds and intact distal pulses.   No murmur heard. Pulmonary/Chest: Effort normal and breath sounds normal. Right breast exhibits no inverted nipple, no mass, no nipple discharge, no skin change and no tenderness. Left breast exhibits skin change. Left breast exhibits no inverted nipple, no mass, no  nipple discharge and no tenderness. Breasts are symmetrical.    Clear and equal 4 x 4 area of erythema and induration the left lower quadrant of the breast at the skin fold with small area of fluctuance; significant tenderness to palpation  Abdominal: Soft. Bowel sounds are normal. She exhibits no distension. There is no tenderness.  Soft and nontender No palpable gravid uterus  Lymphadenopathy:    She has no cervical adenopathy.  Neurological: She is alert and oriented to person, place, and time.  Skin: Skin is warm and dry. She is not diaphoretic. There is erythema.  Psychiatric: She has a normal mood and affect.    ED Course  INCISION AND DRAINAGE Date/Time: 10/15/2013 7:05 PM Performed by: Dierdre Forth Authorized by: Dierdre Forth Consent: Verbal consent obtained. Risks and benefits: risks, benefits and alternatives were discussed Consent given by: patient Patient understanding: patient states understanding of the procedure being performed Patient consent: the patient's understanding of the procedure matches consent given Procedure consent: procedure consent matches procedure scheduled Relevant documents: relevant documents present and verified Site marked: the operative site was marked Required items: required blood products, implants, devices, and special equipment available Patient identity confirmed: verbally with patient and arm band Time out: Immediately prior to procedure a "time out" was called to verify the correct patient, procedure, equipment, support staff and site/side marked as required. Type: abscess Body area: trunk Location details: left breast Anesthesia: local infiltration Local anesthetic: lidocaine 2% without epinephrine Anesthetic total: 3 ml Patient sedated: no Scalpel size: 11 Incision type: single straight Complexity: simple Drainage: purulent Drainage amount: moderate Wound treatment: wound left open Packing material:  none Patient tolerance: Patient tolerated the procedure well with no immediate complications.   (including critical care time) DIAGNOSTIC STUDIES: Oxygen Saturation is 99% on RA, normal by my interpretation.    COORDINATION OF CARE:    7:10 PM- Pt advised of plan for treatment and pt agrees.   Labs Review Labs Reviewed - No data to display Imaging Review No results found.  EKG Interpretation   None       MDM   1. Breast abscess of female   2. Pregnancy     RIKKI TROSPER presents 24 hrs after being seen for breast abscess with complaints that it has worsened.  Patient with skin abscess on breast amenable to incision and drainage.  Abscess was not large enough to warrant packing or drain,  wound recheck in 2 days. Encouraged home warm soaks and flushing.  Mild signs of cellulitis is surrounding skin.  Will d/c to home and have patient continue her antibiotic therapy.  It has been determined that no acute conditions requiring further emergency  intervention are present at this time. The patient/guardian have been advised of the diagnosis and plan. We have discussed signs and symptoms that warrant return to the ED, such as changes or worsening in symptoms.   Vital signs are stable at discharge.   BP 128/77  Pulse 80  Temp(Src) 99.5 F (37.5 C) (Oral)  Resp 18  SpO2 99%  LMP 07/05/2013  Patient/guardian has voiced understanding and agreed to follow-up with the PCP or specialist.     Dierdre Forth, PA-C 10/15/13 1910

## 2013-10-16 NOTE — ED Provider Notes (Signed)
Medical screening examination/treatment/procedure(s) were performed by non-physician practitioner and as supervising physician I was immediately available for consultation/collaboration.  Flint Melter, MD 10/16/13 [phone]

## 2013-10-22 ENCOUNTER — Other Ambulatory Visit: Payer: Self-pay

## 2013-10-22 ENCOUNTER — Encounter: Payer: Medicaid Other | Admitting: Obstetrics & Gynecology

## 2013-10-22 ENCOUNTER — Ambulatory Visit (HOSPITAL_COMMUNITY): Payer: Medicaid Other | Attending: Obstetrics & Gynecology

## 2013-10-22 ENCOUNTER — Ambulatory Visit (HOSPITAL_COMMUNITY): Payer: Medicaid Other

## 2014-01-14 ENCOUNTER — Inpatient Hospital Stay (HOSPITAL_COMMUNITY): Payer: Medicaid Other

## 2014-01-14 ENCOUNTER — Inpatient Hospital Stay (HOSPITAL_COMMUNITY)
Admission: AD | Admit: 2014-01-14 | Discharge: 2014-01-14 | Disposition: A | Discharge status: Home / Self Care | Payer: Medicaid Other | Source: Ambulatory Visit | Attending: Obstetrics & Gynecology | Admitting: Obstetrics & Gynecology

## 2014-01-14 ENCOUNTER — Encounter (HOSPITAL_COMMUNITY): Payer: Self-pay | Admitting: General Practice

## 2014-01-14 DIAGNOSIS — O99330 Smoking (tobacco) complicating pregnancy, unspecified trimester: Secondary | ICD-10-CM

## 2014-01-14 DIAGNOSIS — R109 Unspecified abdominal pain: Secondary | ICD-10-CM | POA: Insufficient documentation

## 2014-01-14 DIAGNOSIS — R3129 Other microscopic hematuria: Secondary | ICD-10-CM | POA: Insufficient documentation

## 2014-01-14 DIAGNOSIS — O0940 Supervision of pregnancy with grand multiparity, unspecified trimester: Secondary | ICD-10-CM

## 2014-01-14 DIAGNOSIS — E039 Hypothyroidism, unspecified: Secondary | ICD-10-CM | POA: Insufficient documentation

## 2014-01-14 DIAGNOSIS — E079 Disorder of thyroid, unspecified: Secondary | ICD-10-CM | POA: Insufficient documentation

## 2014-01-14 DIAGNOSIS — O2390 Unspecified genitourinary tract infection in pregnancy, unspecified trimester: Secondary | ICD-10-CM | POA: Insufficient documentation

## 2014-01-14 DIAGNOSIS — N760 Acute vaginitis: Secondary | ICD-10-CM

## 2014-01-14 DIAGNOSIS — A499 Bacterial infection, unspecified: Secondary | ICD-10-CM | POA: Insufficient documentation

## 2014-01-14 DIAGNOSIS — O09299 Supervision of pregnancy with other poor reproductive or obstetric history, unspecified trimester: Secondary | ICD-10-CM

## 2014-01-14 DIAGNOSIS — O4700 False labor before 37 completed weeks of gestation, unspecified trimester: Secondary | ICD-10-CM | POA: Insufficient documentation

## 2014-01-14 DIAGNOSIS — O99891 Other specified diseases and conditions complicating pregnancy: Secondary | ICD-10-CM

## 2014-01-14 DIAGNOSIS — O3430 Maternal care for cervical incompetence, unspecified trimester: Secondary | ICD-10-CM | POA: Insufficient documentation

## 2014-01-14 DIAGNOSIS — O212 Late vomiting of pregnancy: Secondary | ICD-10-CM | POA: Insufficient documentation

## 2014-01-14 DIAGNOSIS — O218 Other vomiting complicating pregnancy: Secondary | ICD-10-CM

## 2014-01-14 DIAGNOSIS — O99280 Endocrine, nutritional and metabolic diseases complicating pregnancy, unspecified trimester: Secondary | ICD-10-CM

## 2014-01-14 DIAGNOSIS — O0991 Supervision of high risk pregnancy, unspecified, first trimester: Secondary | ICD-10-CM

## 2014-01-14 DIAGNOSIS — O368390 Maternal care for abnormalities of the fetal heart rate or rhythm, unspecified trimester, not applicable or unspecified: Secondary | ICD-10-CM | POA: Insufficient documentation

## 2014-01-14 DIAGNOSIS — O10019 Pre-existing essential hypertension complicating pregnancy, unspecified trimester: Secondary | ICD-10-CM

## 2014-01-14 DIAGNOSIS — B9689 Other specified bacterial agents as the cause of diseases classified elsewhere: Secondary | ICD-10-CM | POA: Insufficient documentation

## 2014-01-14 DIAGNOSIS — O9989 Other specified diseases and conditions complicating pregnancy, childbirth and the puerperium: Secondary | ICD-10-CM

## 2014-01-14 LAB — RAPID URINE DRUG SCREEN, HOSP PERFORMED
Amphetamines: NOT DETECTED
Barbiturates: NOT DETECTED
Benzodiazepines: NOT DETECTED
Cocaine: NOT DETECTED
OPIATES: NOT DETECTED
Tetrahydrocannabinol: POSITIVE — AB

## 2014-01-14 LAB — URINALYSIS, ROUTINE W REFLEX MICROSCOPIC
Bilirubin Urine: NEGATIVE
GLUCOSE, UA: NEGATIVE mg/dL
KETONES UR: NEGATIVE mg/dL
NITRITE: NEGATIVE
PH: 6.5 (ref 5.0–8.0)
Protein, ur: NEGATIVE mg/dL
Specific Gravity, Urine: 1.02 (ref 1.005–1.030)
Urobilinogen, UA: 1 mg/dL (ref 0.0–1.0)

## 2014-01-14 LAB — WET PREP, GENITAL
Trich, Wet Prep: NONE SEEN
Yeast Wet Prep HPF POC: NONE SEEN

## 2014-01-14 LAB — URINE MICROSCOPIC-ADD ON

## 2014-01-14 LAB — FETAL FIBRONECTIN: FETAL FIBRONECTIN: POSITIVE — AB

## 2014-01-14 MED ORDER — METRONIDAZOLE 500 MG PO TABS: 500.0000 mg | ORAL_TABLET | Freq: Two times a day (BID) | ORAL | Status: DC

## 2014-01-14 NOTE — MAU Provider Note (Signed)
History     CSN: [id number]  Arrival date and time: 01/14/14 1118   None     No chief complaint on file.  HPI Comments: 14 [id number].[id number] at 24.5 wks presents with three days of vomiting, abd cramping, hot flashes and cold chills.  States she has been able to keep crackers down today.  Positive sick contact "best friend had pneumonia."    Denies VB, LOF. Reports good FM.   No pain currently No Fever No Diarrhea No Dysuria    Past Medical History  Diagnosis Date  . Anemia     with pregnancies  . Incompetent cervix     cerclage  . History of preterm labor, current pregnancy   . Chlamydia   . Hypothyroidism     No meds  . Postpartum depression 2006  . Hypertension     no meds    Past Surgical History  Procedure Laterality Date  . Cervical cerclage    . Pilonidal cyst / sinus excision    . Intrauterine device insertion      Family History  Problem Relation Age of Onset  . Hypertension Mother   . Cancer Sister     History  Substance Use Topics  . Smoking status: Current Every Day Smoker -- 0.25 packs/day for 3 years    Types: Cigarettes  . Smokeless tobacco: Never Used  . Alcohol Use: No    Allergies: No Known Allergies  Prescriptions prior to admission  Medication Sig Dispense Refill  . acetaminophen (TYLENOL) 325 MG tablet Take 325 mg by mouth every 6 (six) hours as needed for moderate pain.      . Prenatal Multivit-Min-Fe-FA (PRENATAL VITAMINS) 0.8 MG tablet Take 1 tablet by mouth daily.  30 tablet  12    Review of Systems  Gastrointestinal:       See HPI - full ROS performed   Physical Exam   Blood pressure 130/79, pulse 80, temperature 98.6 F (37 C), resp. rate 18, height 5\' 3"  (1.6 m), weight 102.513 kg (226 lb), last menstrual period 07/05/2013.  Physical Exam  Constitutional: She is oriented to person, place, and time. She appears well-developed and well-nourished. No distress.  HENT:  Head: Normocephalic and atraumatic.  Eyes:  Conjunctivae are normal.  Respiratory: No respiratory distress.  GI: Soft. Bowel sounds are normal. There is no tenderness.  Obese abdomen.  Genitourinary: Vagina normal.  Speculum exam: no pooling. cervix appears healthy, normal. Physiologic discharge present. Manual cervical exam deferred d/t h/o PTL, incompetent cervix.  No CVA tenderness. No suprapubic tenderness  Musculoskeletal: She exhibits no edema and no tenderness.  Neurological: She is alert and oriented to person, place, and time.  No focal deficits  Skin: Skin is warm and dry.  Psychiatric: She has a normal mood and affect. Her behavior is normal. Thought content normal.    FHR: Baseline 145. Variability: minimal to mod.  Accels absent. Decels: several variables. No contractions.  FHT appropriate for gestational age Good FM on [place].   MAU Course  Procedures UA  UDS Sterile Speculum Exam FFN Wet prep GC/C  [place]    Assessment and Plan   # 24.5week pregnancy w/ history of PTL, hx of Cervical incompetence on prior pregnancy - Patient initially seen at [facility] initially, cervical cerclage and 17P initially recommended. Patient did not followup and established care in [place], so primary OB is now at [facility]. - Cervical length on [place] is >3.0cm per initial [place] tech read. Good fluid and  fetal movement. - UDS today  - Work up for PTL, PPROM- neg. Good FM on [place]. No pooling. Cervix 3cm on [place]. FFN positive, but poor PPV - Wet prep + BV.Tx BV with 500mg  flagyl for 7d, GC/C pending at discharge. Will contact patient if positive  # Vomiting, Abdominal pain - Pain and vomiting currently resolved. Tolerating PO. Encouraged hydration - Possible gastritis, Viral syndrome, unlikely malpresentation of contractions, PPROM/ PTL, nephrolithiasis based on physical exam and history - workup for PPROM and PTL above.  # BV - Metronidazole 500mg  BID x7days  # Microscopic Hematuria - present today and on multiple past UA's over last 3  years - recommend referral to urology. Discussed with patient.  #Discharge home  # F/u [facility] w/ primary OB  Wilnette Kales 01/14/2014, 12:54 PM   I spoke with and examined patient and agree with resident's note and plan of care.  Fredrik Rigger, MD OB Fellow 01/14/2014 3:16 PM

## 2014-01-14 NOTE — Discharge Instructions (Signed)
Preterm Labor Information Preterm labor is when labor starts before you are [redacted] weeks pregnant. The normal length of pregnancy is 39 to 41 weeks.  CAUSES  The cause of preterm labor is not often known. The most common known cause is infection. RISK FACTORS  Having a history of preterm labor.  Having your water break before it should.  Having a placenta that covers the opening of the cervix.  Having a placenta that breaks away from the uterus.  Having a cervix that is too weak to hold the baby in the uterus.  Having too much fluid in the amniotic sac.  Taking drugs or smoking while pregnant.  Not gaining enough weight while pregnant.  Being younger than 92 and older than 30 years old.  Having a low income.  Being African American. SYMPTOMS  Period-like cramps, belly (abdominal) pain, or back pain.  Contractions that are regular, as often as six in an hour. They may be mild or painful.  Contractions that start at the top of the belly. They then move to the lower belly and back.  Lower belly pressure that seems to get stronger.  Bleeding from the vagina.  Fluid leaking from the vagina. TREATMENT  Treatment depends on:  Your condition.  The condition of your baby.  How many weeks pregnant you are. Your doctor may have you:  Take medicine to stop contractions.  Stay in bed except to use the restroom (bed rest).  Stay in the hospital. WHAT SHOULD YOU DO IF YOU THINK YOU ARE IN PRETERM LABOR? Call your doctor right away. You need to go to the hospital right away.  HOW CAN YOU PREVENT PRETERM LABOR IN FUTURE PREGNANCIES?  Stop smoking, if you smoke.  Maintain healthy weight gain.  Do not take drugs or be around chemicals that are not needed.  Tell your doctor if you think you have an infection.  Tell your doctor if you had a preterm labor before. Document Released: 03/01/2009 Document Revised: 09/23/2013 Document Reviewed: 03/01/2009 [facility] Patient  Information 2014 [place], [place].     Cervical Insufficiency  Cervical insufficiency is when the cervix is weak and starts to open (dilate) and thin (efface) before the pregnancy is at term and without labor starting. This is also called incompetent cervix. It can happen in the second or third trimester when the fetus starts putting pressure on the cervix. Cervical insufficiency can lead to a miscarriage, preterm premature rupture of the membranes (PPROM), or having the baby early (preterm birth).  RISK FACTORS You may be more likely to develop cervical insufficiency if:  You have a shorter cervix than normal.  Damage or injury occurred to your cervix from a past pregnancy or surgery.  You were born with a cervical defect.  You have had procedure done on the cervix, such as cervical biopsy.  You have a history of cervical insufficiency.  You have a history of PPROM.  You have ended several past pregnancies through abortion.  You were exposed to the drug diethylstilbestrol (DES). SYMPTOMS Often times, women do not have any symptoms. Other times, woman may only have mild symptoms that often start between week 14 through 20. The symptoms may last several days or weeks. These symptoms include:  Light spotting or bleeding from the vagina.  Pelvic pressure.  A change in vaginal discharge, such as discharge that changes from clear, white, or light yellow to pink or tan.  Back pain.  Abdominal pain or cramping. DIAGNOSIS Cervical insufficiency  cannot be diagnosed before you become pregnant. Once you are pregnant, your caregiver will ask about your medical history and if you have had any problems in past pregnancies. Tell your caregiver about any procedures performed on your cervix or if you have a history of miscarriages or cervical insufficiency. If your caregiver thinks you are at high risk for cervical insufficiency or show signs of cervical insufficiency, he or she may:  Perform a  pelvic exam. This will check for:  The presence of the membranes (amniotic sac) coming out of the cervix.  Cervical abnormalities.  Cervical injuries.  The presence of contractions.  Perform an ultrasonography (commonly called ultrasound) to measure the length and thickness of the cervix. TREATMENT If you have been diagnosed with cervical insufficiency, your caregiver may recommend:  Limiting physical activity.  Bed rest at home or in the hospital.  Pelvic rest, which means no sexual intercourse or placing anything in the vagina.  Cerclage to sew the cervix closed and prevent it from opening too early. The stitches (sutures) are removed between weeks 36 and 38 to avoid problems during labor. Cerclage may be recommended during pregnancy if you have had a history of miscarriages or preterm births without a known cause. It may also be recommended if you have a short cervix that was identified by ultrasound or if your caregiver has found that your cervix has dilated before 24 weeks of pregnancy. Limiting physical activity and bed rest may or may not help prevent a preterm birth. WHEN SHOULD YOU SEEK IMMEDIATE MEDICAL CARE?  Seek immediate medical care if you show any symptoms of cervical insufficiency. You will need to go to the hospital to get checked immediately. Document Released: 12/03/2005 Document Revised: 08/05/2013 Document Reviewed: 02/09/2013 [facility] Patient Information 2014 [place], [place].

## 2014-01-14 NOTE — MAU Note (Signed)
Was sick for a couple days (n/v). Was having hot flashes and night sweats. Started having stomach pains 3 days ago. Feels real sore, and little sharp pains.

## 2014-01-15 LAB — GC/CHLAMYDIA PROBE AMP
CT Probe RNA: NEGATIVE
GC PROBE AMP APTIMA: NEGATIVE

## 2014-01-21 ENCOUNTER — Encounter: Payer: Medicaid Other | Admitting: Advanced Practice Midwife

## 2014-03-08 ENCOUNTER — Ambulatory Visit (INDEPENDENT_AMBULATORY_CARE_PROVIDER_SITE_OTHER): Payer: Medicaid Other | Admitting: Family Medicine

## 2014-03-08 ENCOUNTER — Encounter: Payer: Self-pay | Admitting: Family Medicine

## 2014-03-08 VITALS — BP 127/90 | Wt 233.2 lb

## 2014-03-08 DIAGNOSIS — O0991 Supervision of high risk pregnancy, unspecified, first trimester: Secondary | ICD-10-CM

## 2014-03-08 DIAGNOSIS — O09219 Supervision of pregnancy with history of pre-term labor, unspecified trimester: Secondary | ICD-10-CM

## 2014-03-08 DIAGNOSIS — O0990 Supervision of high risk pregnancy, unspecified, unspecified trimester: Secondary | ICD-10-CM

## 2014-03-08 DIAGNOSIS — O10019 Pre-existing essential hypertension complicating pregnancy, unspecified trimester: Secondary | ICD-10-CM

## 2014-03-08 DIAGNOSIS — Z23 Encounter for immunization: Secondary | ICD-10-CM

## 2014-03-08 DIAGNOSIS — O09299 Supervision of pregnancy with other poor reproductive or obstetric history, unspecified trimester: Secondary | ICD-10-CM

## 2014-03-08 LAB — POCT URINALYSIS DIP (DEVICE)
BILIRUBIN URINE: NEGATIVE
GLUCOSE, UA: NEGATIVE mg/dL
Ketones, ur: NEGATIVE mg/dL
NITRITE: NEGATIVE
PH: 6.5 (ref 5.0–8.0)
Protein, ur: 30 mg/dL — AB
Specific Gravity, Urine: 1.025 (ref 1.005–1.030)
Urobilinogen, UA: 1 mg/dL (ref 0.0–1.0)

## 2014-03-08 LAB — CBC
HEMATOCRIT: 33.1 % — AB (ref 36.0–46.0)
HEMOGLOBIN: 11 g/dL — AB (ref 12.0–15.0)
MCH: 26.8 pg (ref 26.0–34.0)
MCHC: 33.2 g/dL (ref 30.0–36.0)
MCV: 80.7 fL (ref 78.0–100.0)
Platelets: 304 10*3/uL (ref 150–400)
RBC: 4.1 MIL/uL (ref 3.87–5.11)
RDW: 15.2 % (ref 11.5–15.5)
WBC: 7.8 10*3/uL (ref 4.0–10.5)

## 2014-03-08 NOTE — Addendum Note (Signed)
Addended by: Langston Reusing on: 03/08/2014 09:26 AM   Modules accepted: Orders

## 2014-03-08 NOTE — Progress Notes (Signed)
+  FM, No lof, no vb, no ctx Ocassional headaches, Occasionally seeing black lines without headache, No swelling. No RUQ  Otherwise no complaints  Hypothyroid: REpeat TSH today Elevated Blood pressure: Hx of Chronic HTN, repeat BP today, Draw labs in clinic as long as not symptomatic. Start Biweekly NST, weekly AFI. NST today if able Fetal Monitoring: [place] to evaluate fetal anatomy and growht Diabetes: 3hr on Thursday Smoking: 3/d  Kylie Hicks is a 28 y.o. [id number] at [redacted]w[redacted]d by R=6 here for [place] visit.  Discussed with Patient:  -Plans to bottle feed.  All questions answered. -Continue prenatal vitamins. -Reviewed fetal kick counts Pt to perform daily at a time when the baby is active, lie laterally with both hands on belly in quiet room and count all movements (hiccups, shoulder rolls, obvious kicks, etc); pt is to report to clinic L&D for less than 10 movements felt in a one hour time period-pt told as soon as she counts 10 movements the count is complete.  - Routine precautions discussed (depression, infection s/s).   Patient provided with all pertinent phone numbers for emergencies. - RTC for any VB, regular, painful cramps/ctxs occurring at a rate of >2/10 min, fever (100.5 or higher), n/v/d, any pain that is unresolving or worsening, LOF, decreased fetal movement, CP, SOB, edema - RTC in thursday for next appt.   Problems: Patient Active Problem List   Diagnosis Date Noted  . Supervision of high risk pregnancy in first trimester 09/24/2013  . Benign essential hypertension antepartum 09/03/2013  . Smoking complicating pregnancy, antepartum 09/03/2013  . [place] multiparity, antepartum 09/03/2013  . Hypothyroidism   . Hypothyroid in pregnancy, antepartum 09/10/2011  . Smoker 09/10/2011  . History of postpartum depression, currently pregnant 09/10/2011  . Prior pregnancy with incompetent cervix, antepartum   . History of preterm labor, current pregnancy   . Hypertension     To  Do: 1. Tdap will be given today  [ ]  Vaccines: Flu: declines  Tdap: today [x]  BCM: mirena [x ] Readiness: baby has a place to sleep, car seat, other baby necessities.  Edu: [ x] PTL precautions; [ ]  BF class; [ ]  childbirth class; [ ]   BF counseling;

## 2014-03-08 NOTE — Patient Instructions (Signed)
Third Trimester of Pregnancy The third trimester is from week 29 through week 42, months 7 through 9. The third trimester is a time when the fetus is growing rapidly. At the end of the ninth month, the fetus is about 20 inches in length and weighs 6 10 pounds.  BODY CHANGES Your body goes through many changes during pregnancy. The changes vary from woman to woman.   Your weight will continue to increase. You can expect to gain 25 35 pounds (11 16 kg) by the end of the pregnancy.  You may begin to get stretch marks on your hips, abdomen, and breasts.  You may urinate more often because the fetus is moving lower into your pelvis and pressing on your bladder.  You may develop or continue to have heartburn as a result of your pregnancy.  You may develop constipation because certain hormones are causing the muscles that push waste through your intestines to slow down.  You may develop hemorrhoids or swollen, bulging veins (varicose veins).  You may have pelvic pain because of the weight gain and pregnancy hormones relaxing your joints between the bones in your pelvis. Back aches may result from over exertion of the muscles supporting your posture.  Your breasts will continue to grow and be tender. A yellow discharge may leak from your breasts called colostrum.  Your belly button may stick out.  You may feel short of breath because of your expanding uterus.  You may notice the fetus "dropping," or moving lower in your abdomen.  You may have a bloody mucus discharge. This usually occurs a few days to a week before labor begins.  Your cervix becomes thin and soft (effaced) near your due date. WHAT TO EXPECT AT YOUR PRENATAL EXAMS  You will have prenatal exams every 2 weeks until week 36. Then, you will have weekly prenatal exams. During a routine prenatal visit:  You will be weighed to make sure you and the fetus are growing normally.  Your blood pressure is taken.  Your abdomen will be  measured to track your baby's growth.  The fetal heartbeat will be listened to.  Any test results from the previous visit will be discussed.  You may have a cervical check near your due date to see if you have effaced. At around 36 weeks, your caregiver will check your cervix. At the same time, your caregiver will also perform a test on the secretions of the vaginal tissue. This test is to determine if a type of bacteria, Group B streptococcus, is present. Your caregiver will explain this further. Your caregiver may ask you:  What your birth plan is.  How you are feeling.  If you are feeling the baby move.  If you have had any abnormal symptoms, such as leaking fluid, bleeding, severe headaches, or abdominal cramping.  If you have any questions. Other tests or screenings that may be performed during your third trimester include:  Blood tests that check for low iron levels (anemia).  Fetal testing to check the health, activity level, and growth of the fetus. Testing is done if you have certain medical conditions or if there are problems during the pregnancy. FALSE LABOR You may feel small, irregular contractions that eventually go away. These are called Braxton Hicks contractions, or false labor. Contractions may last for hours, days, or even weeks before true labor sets in. If contractions come at regular intervals, intensify, or become painful, it is best to be seen by your caregiver.  SIGNS OF LABOR   Menstrual-like cramps.  Contractions that are 5 minutes apart or less.  Contractions that start on the top of the uterus and spread down to the lower abdomen and back.  A sense of increased pelvic pressure or back pain.  A watery or bloody mucus discharge that comes from the vagina. If you have any of these signs before the 37th week of pregnancy, call your caregiver right away. You need to go to the hospital to get checked immediately. HOME CARE INSTRUCTIONS   Avoid all  smoking, herbs, alcohol, and unprescribed drugs. These chemicals affect the formation and growth of the baby.  Follow your caregiver's instructions regarding medicine use. There are medicines that are either safe or unsafe to take during pregnancy.  Exercise only as directed by your caregiver. Experiencing uterine cramps is a good sign to stop exercising.  Continue to eat regular, healthy meals.  Wear a good support bra for breast tenderness.  Do not use hot tubs, steam rooms, or saunas.  Wear your seat belt at all times when driving.  Avoid raw meat, uncooked cheese, cat litter boxes, and soil used by cats. These carry germs that can cause birth defects in the baby.  Take your prenatal vitamins.  Try taking a stool softener (if your caregiver approves) if you develop constipation. Eat more high-fiber foods, such as fresh vegetables or fruit and whole grains. Drink plenty of fluids to keep your urine clear or pale yellow.  Take warm sitz baths to soothe any pain or discomfort caused by hemorrhoids. Use hemorrhoid cream if your caregiver approves.  If you develop varicose veins, wear support hose. Elevate your feet for 15 minutes, 3 4 times a day. Limit salt in your diet.  Avoid heavy lifting, wear low heal shoes, and practice good posture.  Rest a lot with your legs elevated if you have leg cramps or low back pain.  Visit your dentist if you have not gone during your pregnancy. Use a soft toothbrush to brush your teeth and be gentle when you floss.  A sexual relationship may be continued unless your caregiver directs you otherwise.  Do not travel far distances unless it is absolutely necessary and only with the approval of your caregiver.  Take prenatal classes to understand, practice, and ask questions about the labor and delivery.  Make a trial run to the hospital.  Pack your hospital bag.  Prepare the baby's nursery.  Continue to go to all your prenatal visits as directed  by your caregiver. SEEK MEDICAL CARE IF:  You are unsure if you are in labor or if your water has broken.  You have dizziness.  You have mild pelvic cramps, pelvic pressure, or nagging pain in your abdominal area.  You have persistent nausea, vomiting, or diarrhea.  You have a bad smelling vaginal discharge.  You have pain with urination. SEEK IMMEDIATE MEDICAL CARE IF:   You have a fever.  You are leaking fluid from your vagina.  You have spotting or bleeding from your vagina.  You have severe abdominal cramping or pain.  You have rapid weight loss or gain.  You have shortness of breath with chest pain.  You notice sudden or extreme swelling of your face, hands, ankles, feet, or legs.  You have not felt your baby move in over an hour.  You have severe headaches that do not go away with medicine.  You have vision changes. Document Released: 11/27/2001 Document Revised: 08/05/2013 Document Reviewed:   You have severe abdominal cramping or pain.   You have rapid weight loss or gain.   You have shortness of breath with chest pain.   You notice sudden or extreme swelling of your face, hands, ankles, feet, or legs.   You have not felt your baby move in over an hour.   You have severe headaches that do not go away with medicine.   You have vision changes.  Document Released: 11/27/2001 Document Revised: 08/05/2013 Document Reviewed: 02/03/2013  [employer] Patient Information 2014 [employer].

## 2014-03-08 NOTE — Progress Notes (Signed)
P=90 Pt. Never had 3hr gtt; states she tried but got sick and never came back. Pt. Will come this Thursday at 0800 for lab appointment (3hr gtt and 28week labs).

## 2014-03-09 ENCOUNTER — Encounter: Payer: Self-pay | Admitting: *Deleted

## 2014-03-09 LAB — TSH: TSH: 2.033 u[IU]/mL (ref 0.350–4.500)

## 2014-03-09 LAB — PROTEIN / CREATININE RATIO, URINE
Creatinine, Urine: 239.7 mg/dL
PROTEIN CREATININE RATIO: 0.13 (ref <0.15)
Total Protein, Urine: 30 mg/dL

## 2014-03-11 ENCOUNTER — Other Ambulatory Visit: Payer: Medicaid Other

## 2014-03-12 ENCOUNTER — Other Ambulatory Visit: Payer: Medicaid Other

## 2014-03-15 ENCOUNTER — Other Ambulatory Visit: Payer: Medicaid Other

## 2014-03-16 ENCOUNTER — Ambulatory Visit (INDEPENDENT_AMBULATORY_CARE_PROVIDER_SITE_OTHER): Payer: Medicaid Other | Admitting: *Deleted

## 2014-03-16 DIAGNOSIS — O10019 Pre-existing essential hypertension complicating pregnancy, unspecified trimester: Secondary | ICD-10-CM

## 2014-03-16 NOTE — Progress Notes (Signed)
NST performed today was reviewed and was found to be reactive.  Continue recommended antenatal testing and prenatal care.  

## 2014-03-17 ENCOUNTER — Ambulatory Visit (HOSPITAL_COMMUNITY): Payer: Medicaid Other

## 2014-03-17 ENCOUNTER — Other Ambulatory Visit: Payer: Medicaid Other

## 2014-03-18 ENCOUNTER — Ambulatory Visit (INDEPENDENT_AMBULATORY_CARE_PROVIDER_SITE_OTHER): Payer: Medicaid Other | Admitting: Obstetrics & Gynecology

## 2014-03-18 VITALS — BP 128/76 | Wt 233.4 lb

## 2014-03-18 DIAGNOSIS — O0993 Supervision of high risk pregnancy, unspecified, third trimester: Secondary | ICD-10-CM

## 2014-03-18 DIAGNOSIS — O0990 Supervision of high risk pregnancy, unspecified, unspecified trimester: Secondary | ICD-10-CM

## 2014-03-18 DIAGNOSIS — O10019 Pre-existing essential hypertension complicating pregnancy, unspecified trimester: Secondary | ICD-10-CM

## 2014-03-18 LAB — POCT URINALYSIS DIP (DEVICE)
BILIRUBIN URINE: NEGATIVE
GLUCOSE, UA: NEGATIVE mg/dL
KETONES UR: NEGATIVE mg/dL
Nitrite: NEGATIVE
PROTEIN: 30 mg/dL — AB
Specific Gravity, Urine: >=1.03 (ref 1.005–1.030)
Urobilinogen, UA: 2 mg/dL — ABNORMAL HIGH (ref 0.0–1.0)
pH: 6.5 (ref 5.0–8.0)

## 2014-03-18 NOTE — Patient Instructions (Signed)
Third Trimester of Pregnancy The third trimester is from week 29 through week 42, months 7 through 9. The third trimester is a time when the fetus is growing rapidly. At the end of the ninth month, the fetus is about 20 inches in length and weighs 6 10 pounds.  BODY CHANGES Your body goes through many changes during pregnancy. The changes vary from woman to woman.   Your weight will continue to increase. You can expect to gain 25 35 pounds (11 16 kg) by the end of the pregnancy.  You may begin to get stretch marks on your hips, abdomen, and breasts.  You may urinate more often because the fetus is moving lower into your pelvis and pressing on your bladder.  You may develop or continue to have heartburn as a result of your pregnancy.  You may develop constipation because certain hormones are causing the muscles that push waste through your intestines to slow down.  You may develop hemorrhoids or swollen, bulging veins (varicose veins).  You may have pelvic pain because of the weight gain and pregnancy hormones relaxing your joints between the bones in your pelvis. Back aches may result from over exertion of the muscles supporting your posture.  Your breasts will continue to grow and be tender. A yellow discharge may leak from your breasts called colostrum.  Your belly button may stick out.  You may feel short of breath because of your expanding uterus.  You may notice the fetus "dropping," or moving lower in your abdomen.  You may have a bloody mucus discharge. This usually occurs a few days to a week before labor begins.  Your cervix becomes thin and soft (effaced) near your due date. WHAT TO EXPECT AT YOUR PRENATAL EXAMS  You will have prenatal exams every 2 weeks until week 36. Then, you will have weekly prenatal exams. During a routine prenatal visit:  You will be weighed to make sure you and the fetus are growing normally.  Your blood pressure is taken.  Your abdomen will be  measured to track your baby's growth.  The fetal heartbeat will be listened to.  Any test results from the previous visit will be discussed.  You may have a cervical check near your due date to see if you have effaced. At around 36 weeks, your caregiver will check your cervix. At the same time, your caregiver will also perform a test on the secretions of the vaginal tissue. This test is to determine if a type of bacteria, Group B streptococcus, is present. Your caregiver will explain this further. Your caregiver may ask you:  What your birth plan is.  How you are feeling.  If you are feeling the baby move.  If you have had any abnormal symptoms, such as leaking fluid, bleeding, severe headaches, or abdominal cramping.  If you have any questions. Other tests or screenings that may be performed during your third trimester include:  Blood tests that check for low iron levels (anemia).  Fetal testing to check the health, activity level, and growth of the fetus. Testing is done if you have certain medical conditions or if there are problems during the pregnancy. FALSE LABOR You may feel small, irregular contractions that eventually go away. These are called Braxton Hicks contractions, or false labor. Contractions may last for hours, days, or even weeks before true labor sets in. If contractions come at regular intervals, intensify, or become painful, it is best to be seen by your caregiver.  SIGNS OF LABOR   Menstrual-like cramps.  Contractions that are 5 minutes apart or less.  Contractions that start on the top of the uterus and spread down to the lower abdomen and back.  A sense of increased pelvic pressure or back pain.  A watery or bloody mucus discharge that comes from the vagina. If you have any of these signs before the 37th week of pregnancy, call your caregiver right away. You need to go to the hospital to get checked immediately. HOME CARE INSTRUCTIONS   Avoid all  smoking, herbs, alcohol, and unprescribed drugs. These chemicals affect the formation and growth of the baby.  Follow your caregiver's instructions regarding medicine use. There are medicines that are either safe or unsafe to take during pregnancy.  Exercise only as directed by your caregiver. Experiencing uterine cramps is a good sign to stop exercising.  Continue to eat regular, healthy meals.  Wear a good support bra for breast tenderness.  Do not use hot tubs, steam rooms, or saunas.  Wear your seat belt at all times when driving.  Avoid raw meat, uncooked cheese, cat litter boxes, and soil used by cats. These carry germs that can cause birth defects in the baby.  Take your prenatal vitamins.  Try taking a stool softener (if your caregiver approves) if you develop constipation. Eat more high-fiber foods, such as fresh vegetables or fruit and whole grains. Drink plenty of fluids to keep your urine clear or pale yellow.  Take warm sitz baths to soothe any pain or discomfort caused by hemorrhoids. Use hemorrhoid cream if your caregiver approves.  If you develop varicose veins, wear support hose. Elevate your feet for 15 minutes, 3 4 times a day. Limit salt in your diet.  Avoid heavy lifting, wear low heal shoes, and practice good posture.  Rest a lot with your legs elevated if you have leg cramps or low back pain.  Visit your dentist if you have not gone during your pregnancy. Use a soft toothbrush to brush your teeth and be gentle when you floss.  A sexual relationship may be continued unless your caregiver directs you otherwise.  Do not travel far distances unless it is absolutely necessary and only with the approval of your caregiver.  Take prenatal classes to understand, practice, and ask questions about the labor and delivery.  Make a trial run to the hospital.  Pack your hospital bag.  Prepare the baby's nursery.  Continue to go to all your prenatal visits as directed  by your caregiver. SEEK MEDICAL CARE IF:  You are unsure if you are in labor or if your water has broken.  You have dizziness.  You have mild pelvic cramps, pelvic pressure, or nagging pain in your abdominal area.  You have persistent nausea, vomiting, or diarrhea.  You have a bad smelling vaginal discharge.  You have pain with urination. SEEK IMMEDIATE MEDICAL CARE IF:   You have a fever.  You are leaking fluid from your vagina.  You have spotting or bleeding from your vagina.  You have severe abdominal cramping or pain.  You have rapid weight loss or gain.  You have shortness of breath with chest pain.  You notice sudden or extreme swelling of your face, hands, ankles, feet, or legs.  You have not felt your baby move in over an hour.  You have severe headaches that do not go away with medicine.  You have vision changes. Document Released: 11/27/2001 Document Revised: 08/05/2013 Document Reviewed:   You have severe abdominal cramping or pain.   You have rapid weight loss or gain.   You have shortness of breath with chest pain.   You notice sudden or extreme swelling of your face, hands, ankles, feet, or legs.   You have not felt your baby move in over an hour.   You have severe headaches that do not go away with medicine.   You have vision changes.  Document Released: 11/27/2001 Document Revised: 08/05/2013 Document Reviewed: 02/03/2013  [employer] Patient Information 2014 [employer].

## 2014-03-18 NOTE — Progress Notes (Signed)
P = 78     Pt needs 3hr GTT - states she cannot have done until 4/9 because of other appts and school schedule.  Pt plans to bottlefeed- tried breastfeeding with previous pregnancies, had problems and got frustrated.

## 2014-03-18 NOTE — Progress Notes (Signed)
NST reactive  Mild isolated variable noted.

## 2014-03-22 ENCOUNTER — Other Ambulatory Visit: Payer: Medicaid Other

## 2014-03-24 ENCOUNTER — Other Ambulatory Visit: Payer: Medicaid Other

## 2014-03-26 ENCOUNTER — Other Ambulatory Visit: Payer: Medicaid Other

## 2014-03-26 ENCOUNTER — Ambulatory Visit (HOSPITAL_COMMUNITY)
Admission: RE | Admit: 2014-03-26 | Discharge: 2014-03-26 | Disposition: A | Discharge status: Home / Self Care | Payer: Medicaid Other | Source: Ambulatory Visit | Attending: Family Medicine | Admitting: Family Medicine

## 2014-03-26 ENCOUNTER — Telehealth: Payer: Self-pay | Admitting: *Deleted

## 2014-03-26 DIAGNOSIS — Z8751 Personal history of pre-term labor: Secondary | ICD-10-CM | POA: Insufficient documentation

## 2014-03-26 DIAGNOSIS — O10019 Pre-existing essential hypertension complicating pregnancy, unspecified trimester: Secondary | ICD-10-CM | POA: Insufficient documentation

## 2014-03-26 DIAGNOSIS — O09299 Supervision of pregnancy with other poor reproductive or obstetric history, unspecified trimester: Secondary | ICD-10-CM | POA: Insufficient documentation

## 2014-03-26 NOTE — Telephone Encounter (Signed)
Called pt regarding missed appt for today. She was supposed to have clinic appt following her [place] for Ob fu and NST (she kept [place] appt @ 0930) . Please call back to schedule appt on 4/14 for NST.

## 2014-03-29 ENCOUNTER — Encounter (HOSPITAL_COMMUNITY): Payer: Self-pay

## 2014-03-29 ENCOUNTER — Inpatient Hospital Stay (HOSPITAL_COMMUNITY)
Admission: AD | Admit: 2014-03-29 | Discharge: 2014-03-30 | Disposition: A | Discharge status: Home / Self Care | Payer: Medicaid Other | Source: Ambulatory Visit | Attending: Obstetrics & Gynecology | Admitting: Obstetrics & Gynecology

## 2014-03-29 ENCOUNTER — Encounter: Payer: Self-pay | Admitting: Family Medicine

## 2014-03-29 DIAGNOSIS — O479 False labor, unspecified: Secondary | ICD-10-CM

## 2014-03-29 DIAGNOSIS — E039 Hypothyroidism, unspecified: Secondary | ICD-10-CM | POA: Insufficient documentation

## 2014-03-29 DIAGNOSIS — O99330 Smoking (tobacco) complicating pregnancy, unspecified trimester: Secondary | ICD-10-CM | POA: Insufficient documentation

## 2014-03-29 DIAGNOSIS — O4700 False labor before 37 completed weeks of gestation, unspecified trimester: Secondary | ICD-10-CM | POA: Insufficient documentation

## 2014-03-29 LAB — URINALYSIS, ROUTINE W REFLEX MICROSCOPIC
Bilirubin Urine: NEGATIVE
Glucose, UA: NEGATIVE mg/dL
KETONES UR: 15 mg/dL — AB
LEUKOCYTES UA: NEGATIVE
Nitrite: NEGATIVE
PH: 6 (ref 5.0–8.0)
Protein, ur: 30 mg/dL — AB
Specific Gravity, Urine: >1.03 — ABNORMAL HIGH (ref 1.005–1.030)
Urobilinogen, UA: 2 mg/dL — ABNORMAL HIGH (ref 0.0–1.0)

## 2014-03-29 LAB — URINE MICROSCOPIC-ADD ON

## 2014-03-29 NOTE — MAU Note (Signed)
Pt G7 P5 at 35.2wks, having lower abd pressure and inner thigh pain.  Denies problems with pregnancy, no bleeding.

## 2014-03-30 DIAGNOSIS — O4700 False labor before 37 completed weeks of gestation, unspecified trimester: Secondary | ICD-10-CM

## 2014-03-30 MED ORDER — CYCLOBENZAPRINE HCL 5 MG PO TABS: 5.0000 mg | ORAL_TABLET | Freq: Three times a day (TID) | ORAL | Status: DC | PRN

## 2014-03-30 NOTE — MAU Provider Note (Signed)
None     Chief Complaint:  Abdominal Pain   Kylie Hicks is  28 y.o. [phone] at [redacted]w[redacted]d presents complaining of Abdominal Pain  Kylie Hicks is a 28 y.o. [phone] at [redacted]w[redacted]d presents with complaints of her lower back going out for the last week. Tonight she felt her lower back going out, felt vaginal pressure, lower abdominal pain that made it difficult to stand. Occurs every couple of hours and lasts for 1 minute.   Normal fetal movement, no Lof nor VB. Ctx as above.  Obstetrical/Gynecological History: OB History   Grav Para Term Preterm Abortions TAB SAB Ect Mult Living   7 6 5 1      6      Past Medical History: Past Medical History  Diagnosis Date  . Anemia     with pregnancies  . Incompetent cervix     cerclage  . History of preterm labor, current pregnancy   . Chlamydia   . Hypothyroidism     No meds  . Postpartum depression 2006  . Hypertension     no meds    Past Surgical History: Past Surgical History  Procedure Laterality Date  . Cervical cerclage    . Pilonidal cyst / sinus excision    . Intrauterine device insertion      Family History: Family History  Problem Relation Age of Onset  . Hypertension Mother   . Cancer Sister     Social History: History  Substance Use Topics  . Smoking status: Current Every Day Smoker -- 0.25 packs/day for 3 years    Types: Cigarettes  . Smokeless tobacco: Never Used  . Alcohol Use: No    Allergies: No Known Allergies  Meds:  Prescriptions prior to admission  Medication Sig Dispense Refill  . acetaminophen (TYLENOL) 325 MG tablet Take 325 mg by mouth every 6 (six) hours as needed for moderate pain.      . Prenatal Multivit-Min-Fe-FA (PRENATAL VITAMINS) 0.8 MG tablet Take 1 tablet by mouth daily.  30 tablet  12    Review of Systems -   Review of Systems  occ HA, no vision change, no CP, occ SOB, no n/v, d/c, urinary symptoms. No other complaints  Physical Exam  Blood pressure 133/78, pulse 78,  temperature 98.5 F (36.9 C), temperature source Oral, resp. rate 20, height 5\' 3"  (1.6 m), weight 105.688 kg (233 lb), last menstrual period 07/05/2013. GENERAL: Well-developed, well-nourished female in no acute distress.  LUNGS: Clear to auscultation bilaterally.  HEART: Regular rate and rhythm. ABDOMEN: Soft, nontender, nondistended, gravid.  EXTREMITIES: Nontender, no edema, 2+ distal pulses. DTR's 2+  Dilation: 2.5 Effacement (%): Thick Cervical Position: Posterior Station: -3 Presentation: Vertex Exam by:: Dr. Leslie Andrea  Presentation: cephalic  FHT:  Baseline rate 130s bpm   Variability moderate  Accelerations present 15x15   Decelerations variable x1, resolved with repositioning and no recurrence. Contractions: Every 7-12 mins   Labs: Results for orders placed during the hospital encounter of 03/29/14 (from the past 24 hour(s))  URINALYSIS, ROUTINE W REFLEX MICROSCOPIC   Collection Time    03/29/14 11:15 PM      Result Value Ref Range   Color, Urine YELLOW  YELLOW   APPearance CLEAR  CLEAR   Specific Gravity, Urine >1.030 (*) 1.005 - 1.030   pH 6.0  5.0 - 8.0   Glucose, UA NEGATIVE  NEGATIVE mg/dL   Hgb urine dipstick MODERATE (*) NEGATIVE   Bilirubin Urine NEGATIVE  NEGATIVE  Ketones, ur 15 (*) NEGATIVE mg/dL   Protein, ur 30 (*) NEGATIVE mg/dL   Urobilinogen, UA 2.0 (*) 0.0 - 1.0 mg/dL   Nitrite NEGATIVE  NEGATIVE   Leukocytes, UA NEGATIVE  NEGATIVE  URINE MICROSCOPIC-ADD ON   Collection Time    03/29/14 11:15 PM      Result Value Ref Range   Squamous Epithelial / LPF FEW (*) RARE   WBC, UA 0-2  <3 WBC/hpf   RBC / HPF 0-2  <3 RBC/hpf   Bacteria, UA RARE  RARE   Urine-Other MUCOUS PRESENT     Imaging Studies:  US Ob Comp + 14 Wk  03/26/2014   OBSTETRICAL ULTRASOUND: This exam was performed within a [facility]. The OB US report was generated in the [facility] system, and faxed to the ordering physician.   This report is also available in [employer] and in the [employer]. See [facility] Obstetric US report.   Assessment: Kylie Hicks is  28 y.o. [phone] at [redacted]w[redacted]d presents with symptoms consistent with uterine contractions. Pt reassured. Will give Rx for flexeril to take PRN severe pain. Pt given return precautions Overal reassuring fetus with good variability. 1 decel resolved once off back. Follow up as previously scheduled for NSTs.   Allen Norris 4/14/20151:06 AM

## 2014-04-04 NOTE — MAU Provider Note (Signed)
Attestation of Attending Supervision of Fellow: Evaluation and management procedures were performed by the Fellow under my supervision and collaboration.  I have reviewed the Fellow's note and chart, and I agree with the management and plan.    

## 2014-04-08 ENCOUNTER — Ambulatory Visit (INDEPENDENT_AMBULATORY_CARE_PROVIDER_SITE_OTHER): Payer: Medicaid Other | Admitting: Obstetrics & Gynecology

## 2014-04-08 VITALS — BP 135/80 | HR 76 | Wt 234.1 lb

## 2014-04-08 DIAGNOSIS — O0993 Supervision of high risk pregnancy, unspecified, third trimester: Secondary | ICD-10-CM

## 2014-04-08 DIAGNOSIS — O99280 Endocrine, nutritional and metabolic diseases complicating pregnancy, unspecified trimester: Secondary | ICD-10-CM

## 2014-04-08 DIAGNOSIS — O10019 Pre-existing essential hypertension complicating pregnancy, unspecified trimester: Secondary | ICD-10-CM

## 2014-04-08 DIAGNOSIS — E079 Disorder of thyroid, unspecified: Secondary | ICD-10-CM

## 2014-04-08 DIAGNOSIS — E039 Hypothyroidism, unspecified: Secondary | ICD-10-CM

## 2014-04-08 LAB — POCT URINALYSIS DIP (DEVICE)
Bilirubin Urine: NEGATIVE
GLUCOSE, UA: NEGATIVE mg/dL
Ketones, ur: NEGATIVE mg/dL
Nitrite: NEGATIVE
Protein, ur: 30 mg/dL — AB
Specific Gravity, Urine: 1.02 (ref 1.005–1.030)
UROBILINOGEN UA: 2 mg/dL — AB (ref 0.0–1.0)
pH: 7 (ref 5.0–8.0)

## 2014-04-08 LAB — GLUCOSE, CAPILLARY: GLUCOSE-CAPILLARY: 73 mg/dL (ref 70–99)

## 2014-04-08 LAB — OB RESULTS CONSOLE GC/CHLAMYDIA
Chlamydia: NEGATIVE
GC PROBE AMP, GENITAL: NEGATIVE

## 2014-04-08 LAB — US OB FOLLOW UP

## 2014-04-08 LAB — OB RESULTS CONSOLE GBS: GBS: POSITIVE

## 2014-04-08 NOTE — Progress Notes (Signed)
BP high nml.  Pt got cultures today.  BTL papers today.  Needs 3 hr GTT.  Pt verbalizes understanding of importance.  Fasting today is 76.  Continue 2x week testing for HTN.

## 2014-04-08 NOTE — Progress Notes (Signed)
C/o of irregular contractions, occuring most often in the morning and lower back pain.  Edema in ankles.

## 2014-04-09 ENCOUNTER — Encounter: Payer: Self-pay | Admitting: *Deleted

## 2014-04-09 LAB — GC/CHLAMYDIA PROBE AMP
CT PROBE, AMP APTIMA: NEGATIVE
GC PROBE AMP APTIMA: NEGATIVE

## 2014-04-12 ENCOUNTER — Ambulatory Visit (INDEPENDENT_AMBULATORY_CARE_PROVIDER_SITE_OTHER): Payer: Medicaid Other | Admitting: *Deleted

## 2014-04-12 ENCOUNTER — Encounter: Payer: Self-pay | Admitting: Obstetrics & Gynecology

## 2014-04-12 VITALS — BP 130/73 | HR 72

## 2014-04-12 DIAGNOSIS — O10019 Pre-existing essential hypertension complicating pregnancy, unspecified trimester: Secondary | ICD-10-CM

## 2014-04-12 LAB — CULTURE, STREPTOCOCCUS GRP B W/SUSCEPT

## 2014-04-12 NOTE — Progress Notes (Signed)
Pt agrees to having 3hr GTT on 4/29 so that results can be reviewed at time of visit on 4/30.  [place] for growth scheduled on 5/7.

## 2014-04-14 ENCOUNTER — Encounter: Payer: Self-pay | Admitting: Obstetrics & Gynecology

## 2014-04-14 ENCOUNTER — Other Ambulatory Visit: Payer: Medicaid Other

## 2014-04-15 ENCOUNTER — Ambulatory Visit (INDEPENDENT_AMBULATORY_CARE_PROVIDER_SITE_OTHER): Payer: Medicaid Other | Admitting: Family Medicine

## 2014-04-15 VITALS — BP 138/89 | HR 69 | Temp 98.6°F | Wt 233.8 lb

## 2014-04-15 DIAGNOSIS — O10019 Pre-existing essential hypertension complicating pregnancy, unspecified trimester: Secondary | ICD-10-CM

## 2014-04-15 DIAGNOSIS — R739 Hyperglycemia, unspecified: Secondary | ICD-10-CM

## 2014-04-15 DIAGNOSIS — R7309 Other abnormal glucose: Secondary | ICD-10-CM

## 2014-04-15 LAB — POCT URINALYSIS DIP (DEVICE)
Bilirubin Urine: NEGATIVE
GLUCOSE, UA: NEGATIVE mg/dL
Ketones, ur: NEGATIVE mg/dL
NITRITE: NEGATIVE
Protein, ur: 30 mg/dL — AB
Specific Gravity, Urine: 1.025 (ref 1.005–1.030)
Urobilinogen, UA: 4 mg/dL — ABNORMAL HIGH (ref 0.0–1.0)
pH: 7 (ref 5.0–8.0)

## 2014-04-15 LAB — US OB FOLLOW UP

## 2014-04-15 LAB — GLUCOSE, CAPILLARY: Glucose-Capillary: 63 mg/dL — ABNORMAL LOW (ref 70–99)

## 2014-04-15 NOTE — Patient Instructions (Signed)
Third Trimester of Pregnancy The third trimester is from week 29 through week 42, months 7 through 9. The third trimester is a time when the fetus is growing rapidly. At the end of the ninth month, the fetus is about 20 inches in length and weighs 6 10 pounds.  BODY CHANGES Your body goes through many changes during pregnancy. The changes vary from woman to woman.   Your weight will continue to increase. You can expect to gain 25 35 pounds (11 16 kg) by the end of the pregnancy.  You may begin to get stretch marks on your hips, abdomen, and breasts.  You may urinate more often because the fetus is moving lower into your pelvis and pressing on your bladder.  You may develop or continue to have heartburn as a result of your pregnancy.  You may develop constipation because certain hormones are causing the muscles that push waste through your intestines to slow down.  You may develop hemorrhoids or swollen, bulging veins (varicose veins).  You may have pelvic pain because of the weight gain and pregnancy hormones relaxing your joints between the bones in your pelvis. Back aches may result from over exertion of the muscles supporting your posture.  Your breasts will continue to grow and be tender. A yellow discharge may leak from your breasts called colostrum.  Your belly button may stick out.  You may feel short of breath because of your expanding uterus.  You may notice the fetus "dropping," or moving lower in your abdomen.  You may have a bloody mucus discharge. This usually occurs a few days to a week before labor begins.  Your cervix becomes thin and soft (effaced) near your due date. WHAT TO EXPECT AT YOUR PRENATAL EXAMS  You will have prenatal exams every 2 weeks until week 36. Then, you will have weekly prenatal exams. During a routine prenatal visit:  You will be weighed to make sure you and the fetus are growing normally.  Your blood pressure is taken.  Your abdomen will  be measured to track your baby's growth.  The fetal heartbeat will be listened to.  Any test results from the previous visit will be discussed.  You may have a cervical check near your due date to see if you have effaced. At around 36 weeks, your caregiver will check your cervix. At the same time, your caregiver will also perform a test on the secretions of the vaginal tissue. This test is to determine if a type of bacteria, Group B streptococcus, is present. Your caregiver will explain this further. Your caregiver may ask you:  What your birth plan is.  How you are feeling.  If you are feeling the baby move.  If you have had any abnormal symptoms, such as leaking fluid, bleeding, severe headaches, or abdominal cramping.  If you have any questions. Other tests or screenings that may be performed during your third trimester include:  Blood tests that check for low iron levels (anemia).  Fetal testing to check the health, activity level, and growth of the fetus. Testing is done if you have certain medical conditions or if there are problems during the pregnancy. FALSE LABOR You may feel small, irregular contractions that eventually go away. These are called Braxton Hicks contractions, or false labor. Contractions may last for hours, days, or even weeks before true labor sets in. If contractions come at regular intervals, intensify, or become painful, it is best to be seen by your caregiver.    SIGNS OF LABOR   Menstrual-like cramps.  Contractions that are 5 minutes apart or less.  Contractions that start on the top of the uterus and spread down to the lower abdomen and back.  A sense of increased pelvic pressure or back pain.  A watery or bloody mucus discharge that comes from the vagina. If you have any of these signs before the 37th week of pregnancy, call your caregiver right away. You need to go to the hospital to get checked immediately. HOME CARE INSTRUCTIONS   Avoid all  smoking, herbs, alcohol, and unprescribed drugs. These chemicals affect the formation and growth of the baby.  Follow your caregiver's instructions regarding medicine use. There are medicines that are either safe or unsafe to take during pregnancy.  Exercise only as directed by your caregiver. Experiencing uterine cramps is a good sign to stop exercising.  Continue to eat regular, healthy meals.  Wear a good support bra for breast tenderness.  Do not use hot tubs, steam rooms, or saunas.  Wear your seat belt at all times when driving.  Avoid raw meat, uncooked cheese, cat litter boxes, and soil used by cats. These carry germs that can cause birth defects in the baby.  Take your prenatal vitamins.  Try taking a stool softener (if your caregiver approves) if you develop constipation. Eat more high-fiber foods, such as fresh vegetables or fruit and whole grains. Drink plenty of fluids to keep your urine clear or pale yellow.  Take warm sitz baths to soothe any pain or discomfort caused by hemorrhoids. Use hemorrhoid cream if your caregiver approves.  If you develop varicose veins, wear support hose. Elevate your feet for 15 minutes, 3 4 times a day. Limit salt in your diet.  Avoid heavy lifting, wear low heal shoes, and practice good posture.  Rest a lot with your legs elevated if you have leg cramps or low back pain.  Visit your dentist if you have not gone during your pregnancy. Use a soft toothbrush to brush your teeth and be gentle when you floss.  A sexual relationship may be continued unless your caregiver directs you otherwise.  Do not travel far distances unless it is absolutely necessary and only with the approval of your caregiver.  Take prenatal classes to understand, practice, and ask questions about the labor and delivery.  Make a trial run to the hospital.  Pack your hospital bag.  Prepare the baby's nursery.  Continue to go to all your prenatal visits as directed  by your caregiver. SEEK MEDICAL CARE IF:  You are unsure if you are in labor or if your water has broken.  You have dizziness.  You have mild pelvic cramps, pelvic pressure, or nagging pain in your abdominal area.  You have persistent nausea, vomiting, or diarrhea.  You have a bad smelling vaginal discharge.  You have pain with urination. SEEK IMMEDIATE MEDICAL CARE IF:   You have a fever.  You are leaking fluid from your vagina.  You have spotting or bleeding from your vagina.  You have severe abdominal cramping or pain.  You have rapid weight loss or gain.  You have shortness of breath with chest pain.  You notice sudden or extreme swelling of your face, hands, ankles, feet, or legs.  You have not felt your baby move in over an hour.  You have severe headaches that do not go away with medicine.  You have vision changes. Document Released: 11/27/2001 Document Revised: 08/05/2013 Document Reviewed:   02/03/2013 [employer] Patient Information 2014 [employer].  Breastfeeding Deciding to breastfeed is one of the best choices you can make for you and your baby. A change in hormones during pregnancy causes your breast tissue to grow and increases the number and size of your milk ducts. These hormones also allow proteins, sugars, and fats from your blood supply to make breast milk in your milk-producing glands. Hormones prevent breast milk from being released before your baby is born as well as prompt milk flow after birth. Once breastfeeding has begun, thoughts of your baby, as well as his or her sucking or crying, can stimulate the release of milk from your milk-producing glands.  BENEFITS OF BREASTFEEDING For Your Baby  Your first milk (colostrum) helps your baby's digestive system function better.   There are antibodies in your milk that help your baby fight off infections.   Your baby has a lower incidence of asthma, allergies, and sudden infant death syndrome.    The nutrients in breast milk are better for your baby than infant formulas and are designed uniquely for your baby's needs.   Breast milk improves your baby's brain development.   Your baby is less likely to develop other conditions, such as childhood obesity, asthma, or type 2 diabetes mellitus.  For You   Breastfeeding helps to create a very special bond between you and your baby.   Breastfeeding is convenient. Breast milk is always available at the correct temperature and costs nothing.   Breastfeeding helps to burn calories and helps you lose the weight gained during pregnancy.   Breastfeeding makes your uterus contract to its prepregnancy size faster and slows bleeding (lochia) after you give birth.   Breastfeeding helps to lower your risk of developing type 2 diabetes mellitus, osteoporosis, and breast or ovarian cancer later in life. SIGNS THAT YOUR BABY IS HUNGRY Early Signs of Hunger  Increased alertness or activity.  Stretching.  Movement of the head from side to side.  Movement of the head and opening of the mouth when the corner of the mouth or cheek is stroked (rooting).  Increased sucking sounds, smacking lips, cooing, sighing, or squeaking.  Hand-to-mouth movements.  Increased sucking of fingers or hands. Late Signs of Hunger  Fussing.  Intermittent crying. Extreme Signs of Hunger Signs of extreme hunger will require calming and consoling before your baby will be able to breastfeed successfully. Do not wait for the following signs of extreme hunger to occur before you initiate breastfeeding:   Restlessness.  A loud, strong cry.   Screaming. BREASTFEEDING BASICS Breastfeeding Initiation  Find a comfortable place to sit or lie down, with your neck and back well supported.  Place a pillow or rolled up blanket under your baby to bring him or her to the level of your breast (if you are seated). Nursing pillows are specially designed to help  support your arms and your baby while you breastfeed.  Make sure that your baby's abdomen is facing your abdomen.   Gently massage your breast. With your fingertips, massage from your chest wall toward your nipple in a circular motion. This encourages milk flow. You may need to continue this action during the feeding if your milk flows slowly.  Support your breast with 4 fingers underneath and your thumb above your nipple. Make sure your fingers are well away from your nipple and your baby's mouth.   Stroke your baby's lips gently with your finger or nipple.   When your baby's mouth is   open wide enough, quickly bring your baby to your breast, placing your entire nipple and as much of the colored area around your nipple (areola) as possible into your baby's mouth.   More areola should be visible above your baby's upper lip than below the lower lip.   Your baby's tongue should be between his or her lower gum and your breast.   Ensure that your baby's mouth is correctly positioned around your nipple (latched). Your baby's lips should create a seal on your breast and be turned out (everted).  It is common for your baby to suck about 2 3 minutes in order to start the flow of breast milk. Latching Teaching your baby how to latch on to your breast properly is very important. An improper latch can cause nipple pain and decreased milk supply for you and poor weight gain in your baby. Also, if your baby is not latched onto your nipple properly, he or she may swallow some air during feeding. This can make your baby fussy. Burping your baby when you switch breasts during the feeding can help to get rid of the air. However, teaching your baby to latch on properly is still the best way to prevent fussiness from swallowing air while breastfeeding. Signs that your baby has successfully latched on to your nipple:    Silent tugging or silent sucking, without causing you pain.   Swallowing heard  between every 3 4 sucks.    Muscle movement above and in front of his or her ears while sucking.  Signs that your baby has not successfully latched on to nipple:   Sucking sounds or smacking sounds from your baby while breastfeeding.  Nipple pain. If you think your baby has not latched on correctly, slip your finger into the corner of your baby's mouth to break the suction and place it between your baby's gums. Attempt breastfeeding initiation again. Signs of Successful Breastfeeding Signs from your baby:   A gradual decrease in the number of sucks or complete cessation of sucking.   Falling asleep.   Relaxation of his or her body.   Retention of a small amount of milk in his or her mouth.   Letting go of your breast by himself or herself. Signs from you:  Breasts that have increased in firmness, weight, and size 1 3 hours after feeding.   Breasts that are softer immediately after breastfeeding.  Increased milk volume, as well as a change in milk consistency and color by the 5th day of breastfeeding.   Nipples that are not sore, cracked, or bleeding. Signs That Your Baby is Getting Enough Milk  Wetting at least 3 diapers in a 24-hour period. The urine should be clear and pale yellow by age 5 days.  At least 3 stools in a 24-hour period by age 5 days. The stool should be soft and yellow.  At least 3 stools in a 24-hour period by age 7 days. The stool should be seedy and yellow.  No loss of weight greater than 10% of birth weight during the first 3 days of age.  Average weight gain of 4 7 ounces (120 210 mL) per week after age 4 days.  Consistent daily weight gain by age 5 days, without weight loss after the age of 2 weeks. After a feeding, your baby may spit up a small amount. This is common. BREASTFEEDING FREQUENCY AND DURATION Frequent feeding will help you make more milk and can prevent sore nipples and breast engorgement.   Breastfeed when you feel the need to  reduce the fullness of your breasts or when your baby shows signs of hunger. This is called "breastfeeding on demand." Avoid introducing a pacifier to your baby while you are working to establish breastfeeding (the first 4 6 weeks after your baby is born). After this time you may choose to use a pacifier. Research has shown that pacifier use during the first year of a baby's life decreases the risk of sudden infant death syndrome (SIDS). Allow your baby to feed on each breast as long as he or she wants. Breastfeed until your baby is finished feeding. When your baby unlatches or falls asleep while feeding from the first breast, offer the second breast. Because newborns are often sleepy in the first few weeks of life, you may need to awaken your baby to get him or her to feed. Breastfeeding times will vary from baby to baby. However, the following rules can serve as a guide to help you ensure that your baby is properly fed:  Newborns (babies 4 weeks of age or younger) may breastfeed every 1 3 hours.  Newborns should not go longer than 3 hours during the day or 5 hours during the night without breastfeeding.  You should breastfeed your baby a minimum of 8 times in a 24-hour period until you begin to introduce solid foods to your baby at around 6 months of age. BREAST MILK PUMPING Pumping and storing breast milk allows you to ensure that your baby is exclusively fed your breast milk, even at times when you are unable to breastfeed. This is especially important if you are going back to work while you are still breastfeeding or when you are not able to be present during feedings. Your lactation consultant can give you guidelines on how long it is safe to store breast milk.  A breast pump is a machine that allows you to pump milk from your breast into a sterile bottle. The pumped breast milk can then be stored in a refrigerator or freezer. Some breast pumps are operated by hand, while others use electricity. Ask  your lactation consultant which type will work best for you. Breast pumps can be purchased, but some hospitals and breastfeeding support groups lease breast pumps on a monthly basis. A lactation consultant can teach you how to hand express breast milk, if you prefer not to use a pump.  CARING FOR YOUR BREASTS WHILE YOU BREASTFEED Nipples can become dry, cracked, and sore while breastfeeding. The following recommendations can help keep your breasts moisturized and healthy:  Avoid using soap on your nipples.   Wear a supportive bra. Although not required, special nursing bras and tank tops are designed to allow access to your breasts for breastfeeding without taking off your entire bra or top. Avoid wearing underwire style bras or extremely tight bras.  Air dry your nipples for 3 4minutes after each feeding.   Use only cotton bra pads to absorb leaked breast milk. Leaking of breast milk between feedings is normal.   Use lanolin on your nipples after breastfeeding. Lanolin helps to maintain your skin's normal moisture barrier. If you use pure lanolin you do not need to wash it off before feeding your baby again. Pure lanolin is not toxic to your baby. You may also hand express a few drops of breast milk and gently massage that milk into your nipples and allow the milk to air dry. In the first few weeks after giving birth, some women   experience extremely full breasts (engorgement). Engorgement can make your breasts feel heavy, warm, and tender to the touch. Engorgement peaks within 3 5 days after you give birth. The following recommendations can help ease engorgement:  Completely empty your breasts while breastfeeding or pumping. You may want to start by applying warm, moist heat (in the shower or with warm water-soaked hand towels) just before feeding or pumping. This increases circulation and helps the milk flow. If your baby does not completely empty your breasts while breastfeeding, pump any extra  milk after he or she is finished.  Wear a snug bra (nursing or regular) or tank top for 1 2 days to signal your body to slightly decrease milk production.  Apply ice packs to your breasts, unless this is too uncomfortable for you.  Make sure that your baby is latched on and positioned properly while breastfeeding. If engorgement persists after 48 hours of following these recommendations, contact your health care provider or a lactation consultant. OVERALL HEALTH CARE RECOMMENDATIONS WHILE BREASTFEEDING  Eat healthy foods. Alternate between meals and snacks, eating 3 of each per day. Because what you eat affects your breast milk, some of the foods may make your baby more irritable than usual. Avoid eating these foods if you are sure that they are negatively affecting your baby.  Drink milk, fruit juice, and water to satisfy your thirst (about 10 glasses a day).   Rest often, relax, and continue to take your prenatal vitamins to prevent fatigue, stress, and anemia.  Continue breast self-awareness checks.  Avoid chewing and smoking tobacco.  Avoid alcohol and drug use. Some medicines that may be harmful to your baby can pass through breast milk. It is important to ask your health care provider before taking any medicine, including all over-the-counter and prescription medicine as well as vitamin and herbal supplements. It is possible to become pregnant while breastfeeding. If birth control is desired, ask your health care provider about options that will be safe for your baby. SEEK MEDICAL CARE IF:   You feel like you want to stop breastfeeding or have become frustrated with breastfeeding.  You have painful breasts or nipples.  Your nipples are cracked or bleeding.  Your breasts are red, tender, or warm.  You have a swollen area on either breast.  You have a fever or chills.  You have nausea or vomiting.  You have drainage other than breast milk from your nipples.  Your breasts  do not become full before feedings by the 5th day after you give birth.  You feel sad and depressed.  Your baby is too sleepy to eat well.  Your baby is having trouble sleeping.   Your baby is wetting less than 3 diapers in a 24-hour period.  Your baby has less than 3 stools in a 24-hour period.  Your baby's skin or the white part of his or her eyes becomes yellow.   Your baby is not gaining weight by 5 days of age. SEEK IMMEDIATE MEDICAL CARE IF:   Your baby is overly tired (lethargic) and does not want to wake up and feed.  Your baby develops an unexplained fever. Document Released: 12/03/2005 Document Revised: 08/05/2013 Document Reviewed: 05/27/2013 [employer] Patient Information 2014 [employer].  

## 2014-04-15 NOTE — Progress Notes (Signed)
No 3 hour done--2 hour pp today was 63--likely not diabetic--check fasting next visit NST reviewed and reactive. BP is well controlled [place] for growth scheduled next week. IOL at 40 wks.

## 2014-04-15 NOTE — Progress Notes (Signed)
Pt states she missed 3 hour glucose test yesterday due to vomiting.  Pt reports eating this am @ 0730, butter biscuit/water.  CBG done today at 0930, 2 hours after meal.

## 2014-04-15 NOTE — Progress Notes (Signed)
Too early to schedule IOL. Will be done by Shauna Hugh, RNC on 04/19/14 during pt.'s NST.

## 2014-04-16 ENCOUNTER — Encounter: Payer: Self-pay | Admitting: Obstetrics & Gynecology

## 2014-04-19 ENCOUNTER — Other Ambulatory Visit: Payer: Medicaid Other

## 2014-04-20 ENCOUNTER — Inpatient Hospital Stay (HOSPITAL_COMMUNITY)
Admission: AD | Admit: 2014-04-20 | Discharge: 2014-04-21 | Disposition: A | Discharge status: Home / Self Care | Payer: Medicaid Other | Source: Ambulatory Visit | Attending: Obstetrics & Gynecology | Admitting: Obstetrics & Gynecology

## 2014-04-20 DIAGNOSIS — O09899 Supervision of other high risk pregnancies, unspecified trimester: Secondary | ICD-10-CM | POA: Insufficient documentation

## 2014-04-20 DIAGNOSIS — E079 Disorder of thyroid, unspecified: Secondary | ICD-10-CM | POA: Insufficient documentation

## 2014-04-20 DIAGNOSIS — O99280 Endocrine, nutritional and metabolic diseases complicating pregnancy, unspecified trimester: Secondary | ICD-10-CM

## 2014-04-20 DIAGNOSIS — O0993 Supervision of high risk pregnancy, unspecified, third trimester: Secondary | ICD-10-CM

## 2014-04-20 DIAGNOSIS — O3430 Maternal care for cervical incompetence, unspecified trimester: Secondary | ICD-10-CM | POA: Insufficient documentation

## 2014-04-20 DIAGNOSIS — E039 Hypothyroidism, unspecified: Secondary | ICD-10-CM | POA: Insufficient documentation

## 2014-04-20 DIAGNOSIS — R109 Unspecified abdominal pain: Secondary | ICD-10-CM | POA: Insufficient documentation

## 2014-04-20 DIAGNOSIS — R51 Headache: Secondary | ICD-10-CM | POA: Insufficient documentation

## 2014-04-20 DIAGNOSIS — O10019 Pre-existing essential hypertension complicating pregnancy, unspecified trimester: Secondary | ICD-10-CM | POA: Insufficient documentation

## 2014-04-20 DIAGNOSIS — O99330 Smoking (tobacco) complicating pregnancy, unspecified trimester: Secondary | ICD-10-CM | POA: Insufficient documentation

## 2014-04-20 NOTE — MAU Note (Signed)
Pt G7 P5 at 38.3wks having abd pain that comes and goes x 2 days.  Headache x 2 days.

## 2014-04-21 ENCOUNTER — Encounter (HOSPITAL_COMMUNITY): Payer: Self-pay

## 2014-04-21 DIAGNOSIS — O479 False labor, unspecified: Secondary | ICD-10-CM

## 2014-04-21 DIAGNOSIS — O10019 Pre-existing essential hypertension complicating pregnancy, unspecified trimester: Secondary | ICD-10-CM

## 2014-04-21 LAB — URINALYSIS, ROUTINE W REFLEX MICROSCOPIC
Bilirubin Urine: NEGATIVE
GLUCOSE, UA: NEGATIVE mg/dL
Ketones, ur: 15 mg/dL — AB
Leukocytes, UA: NEGATIVE
Nitrite: NEGATIVE
PH: 6 (ref 5.0–8.0)
PROTEIN: 30 mg/dL — AB
Specific Gravity, Urine: >1.03 — ABNORMAL HIGH (ref 1.005–1.030)
Urobilinogen, UA: 4 mg/dL — ABNORMAL HIGH (ref 0.0–1.0)

## 2014-04-21 LAB — COMPREHENSIVE METABOLIC PANEL
ALBUMIN: 2.5 g/dL — AB (ref 3.5–5.2)
ALK PHOS: 127 U/L — AB (ref 39–117)
ALT: 9 U/L (ref 0–35)
AST: 16 U/L (ref 0–37)
BUN: 7 mg/dL (ref 6–23)
CALCIUM: 9 mg/dL (ref 8.4–10.5)
CO2: 22 mEq/L (ref 19–32)
Chloride: 99 mEq/L (ref 96–112)
Creatinine, Ser: 0.81 mg/dL (ref 0.50–1.10)
GFR calc Af Amer: >90 mL/min (ref >90)
GFR calc non Af Amer: >90 mL/min (ref >90)
GLUCOSE: 81 mg/dL (ref 70–99)
POTASSIUM: 3.4 meq/L — AB (ref 3.7–5.3)
SODIUM: 135 meq/L — AB (ref 137–147)
TOTAL PROTEIN: 5.7 g/dL — AB (ref 6.0–8.3)
Total Bilirubin: <0.2 mg/dL — ABNORMAL LOW (ref 0.3–1.2)

## 2014-04-21 LAB — URINE MICROSCOPIC-ADD ON

## 2014-04-21 LAB — PROTEIN / CREATININE RATIO, URINE
Creatinine, Urine: 436.84 mg/dL
PROTEIN CREATININE RATIO: 0.11 (ref 0.00–0.15)
Total Protein, Urine: 49.3 mg/dL

## 2014-04-21 LAB — GLUCOSE, CAPILLARY: GLUCOSE-CAPILLARY: 88 mg/dL (ref 70–99)

## 2014-04-21 LAB — CBC
HCT: 28.6 % — ABNORMAL LOW (ref 36.0–46.0)
Hemoglobin: 9.5 g/dL — ABNORMAL LOW (ref 12.0–15.0)
MCH: 26.6 pg (ref 26.0–34.0)
MCHC: 33.2 g/dL (ref 30.0–36.0)
MCV: 80.1 fL (ref 78.0–100.0)
PLATELETS: 234 10*3/uL (ref 150–400)
RBC: 3.57 MIL/uL — ABNORMAL LOW (ref 3.87–5.11)
RDW: 13.9 % (ref 11.5–15.5)
WBC: 6.3 10*3/uL (ref 4.0–10.5)

## 2014-04-21 NOTE — MAU Provider Note (Signed)
Chief Complaint:  Abdominal Pain and Headache   None     HPI: Kylie Hicks is a 28 y.o. [phone] at [redacted]w[redacted]d who presents to maternity admissions reporting upper and lower abdominal pain starting today.  She reports abdominal cramping and pelvic pressure, but is unable to report how often.  She also reports RUQ tenderness with onset tonight.  She denies h/a or visual disturbances.  She reports good fetal movement, denies LOF, vaginal bleeding, vaginal itching/burning, urinary symptoms, h/a, dizziness, n/v, or fever/chills.    Past Medical History: Past Medical History  Diagnosis Date  . Anemia     with pregnancies  . Incompetent cervix     cerclage  . History of preterm labor, current pregnancy   . Chlamydia   . Hypothyroidism     No meds  . Postpartum depression 2006  . Hypertension     no meds    Past obstetric history: OB History  Gravida Para Term Preterm AB SAB TAB Ectopic Multiple Living  7 6 5 1      6     # Outcome Date GA Lbr Len/2nd Weight Sex Delivery Anes PTL Lv  7 CUR           6 TRM 10/19/11 [redacted]w[redacted]d 14:00 / 00:01 2.955 kg (6 lb 8.2 oz) F SVD None  Y     Comments: On 17P  5 TRM 11/24/10 [redacted]w[redacted]d  2.551 kg (5 lb 10 oz) F SVD EPI  Y     Comments: cerclage, on 17P  4 TRM 04/15/08 [redacted]w[redacted]d 04:00 2.807 kg (6 lb 3 oz) F SVD EPI  Y     Comments: no complications  3 PRE 84/66/59 [redacted]w[redacted]d 24:00 1.417 kg (3 lb 2 oz) M SVD EPI  Y     Comments: cerclage 12 weeks, preterm labor 31 wks  2 TRM 02/06/05 [redacted]w[redacted]d 17:30 2.353 kg (5 lb 3 oz) M SVD EPI  Y     Comments: postpartum depression, on lexapro x 6 months  1 TRM 06/08/03 [redacted]w[redacted]d 08:00 2.551 kg (5 lb 10 oz) M SVD EPI  Y     Comments: possible IUGR, states worried about growtj      Past Surgical History: Past Surgical History  Procedure Laterality Date  . Cervical cerclage    . Pilonidal cyst / sinus excision    . Intrauterine device insertion      Family History: Family History  Problem Relation Age of Onset  . Hypertension  Mother   . Cancer Sister     Social History: History  Substance Use Topics  . Smoking status: Current Every Day Smoker -- 0.25 packs/day for 3 years    Types: Cigarettes  . Smokeless tobacco: Never Used  . Alcohol Use: No    Allergies: No Known Allergies  Meds:  Prescriptions prior to admission  Medication Sig Dispense Refill  . acetaminophen (TYLENOL) 325 MG tablet Take 325 mg by mouth every 6 (six) hours as needed for moderate pain.      . cyclobenzaprine (FLEXERIL) 5 MG tablet Take 1 tablet (5 mg total) by mouth 3 (three) times daily as needed for muscle spasms.  30 tablet  0  . Prenatal Multivit-Min-Fe-FA (PRENATAL VITAMINS) 0.8 MG tablet Take 1 tablet by mouth daily.  30 tablet  12    ROS: Pertinent findings in history of present illness.  Physical Exam  Blood pressure 141/83, pulse 83, temperature 99.7 F (37.6 C), temperature source Oral, resp. rate 20, height 5\' 3"  (  1.6 m), weight 107.593 kg (237 lb 3.2 oz), last menstrual period 07/05/2013. GENERAL: Well-developed, well-nourished female in no acute distress.  HEENT: normocephalic HEART: normal rate RESP: normal effort ABDOMEN: Soft, non-tender, gravid appropriate for gestational age EXTREMITIES: Nontender, no edema NEURO: alert and oriented clean Dilation: 5 Effacement (%): 50 Cervical Position: Posterior Station: -3 Exam by:: L Leftwich Kirby CNM  Cervix unchanged in 1.5 hours in MAU.  Contractions spaced out and less frequent than when pt arrived in MAU.  FHT:  Baseline 135 , moderate variability, accelerations present, no decelerations Contractions: q 6-15 minutes, irregular   Labs: Results for orders placed during the hospital encounter of 04/20/14 (from the past 24 hour(s))  URINALYSIS, ROUTINE W REFLEX MICROSCOPIC     Status: Abnormal   Collection Time    04/20/14 11:55 PM      Result Value Ref Range   Color, Urine YELLOW  YELLOW   APPearance CLEAR  CLEAR   Specific Gravity, Urine >1.030 (*) 1.005  - 1.030   pH 6.0  5.0 - 8.0   Glucose, UA NEGATIVE  NEGATIVE mg/dL   Hgb urine dipstick SMALL (*) NEGATIVE   Bilirubin Urine NEGATIVE  NEGATIVE   Ketones, ur 15 (*) NEGATIVE mg/dL   Protein, ur 30 (*) NEGATIVE mg/dL   Urobilinogen, UA 4.0 (*) 0.0 - 1.0 mg/dL   Nitrite NEGATIVE  NEGATIVE   Leukocytes, UA NEGATIVE  NEGATIVE  URINE MICROSCOPIC-ADD ON     Status: None   Collection Time    04/20/14 11:55 PM      Result Value Ref Range   Squamous Epithelial / LPF RARE  RARE   WBC, UA 0-2  <3 WBC/hpf   RBC / HPF 3-6  <3 RBC/hpf   Bacteria, UA RARE  RARE   Urine-Other MUCOUS PRESENT    CBC     Status: Abnormal   Collection Time    04/21/14  1:05 AM      Result Value Ref Range   WBC 6.3  4.0 - 10.5 K/uL   RBC 3.57 (*) 3.87 - 5.11 MIL/uL   Hemoglobin 9.5 (*) 12.0 - 15.0 g/dL   HCT 28.6 (*) 36.0 - 46.0 %   MCV 80.1  78.0 - 100.0 fL   MCH 26.6  26.0 - 34.0 pg   MCHC 33.2  30.0 - 36.0 g/dL   RDW 13.9  11.5 - 15.5 %   Platelets 234  150 - 400 K/uL  COMPREHENSIVE METABOLIC PANEL     Status: Abnormal   Collection Time    04/21/14  1:05 AM      Result Value Ref Range   Sodium 135 (*) 137 - 147 mEq/L   Potassium 3.4 (*) 3.7 - 5.3 mEq/L   Chloride 99  96 - 112 mEq/L   CO2 22  19 - 32 mEq/L   Glucose, Bld 81  70 - 99 mg/dL   BUN 7  6 - 23 mg/dL   Creatinine, Ser 0.81  0.50 - 1.10 mg/dL   Calcium 9.0  8.4 - 10.5 mg/dL   Total Protein 5.7 (*) 6.0 - 8.3 g/dL   Albumin 2.5 (*) 3.5 - 5.2 g/dL   AST 16  0 - 37 U/L   ALT 9  0 - 35 U/L   Alkaline Phosphatase 127 (*) 39 - 117 U/L   Total Bilirubin <0.2 (*) 0.3 - 1.2 mg/dL   GFR calc non Af Amer >90  >90 mL/min   GFR calc Af Amer >  90  >90 mL/min  GLUCOSE, CAPILLARY     Status: None   Collection Time    04/21/14  1:21 AM      Result Value Ref Range   Glucose-Capillary 88  70 - 99 mg/dL  PROTEIN / CREATININE RATIO, URINE     Status: None   Collection Time    04/21/14  2:25 AM      Result Value Ref Range   Creatinine, Urine 436.84      Total Protein, Urine 49.3     PROTEIN CREATININE RATIO 0.11  0.00 - 0.15     Assessment: 1. Supervision of high risk pregnancy in third trimester   2. Benign essential hypertension antepartum     Plan: Consult Dr Ihor Dow D/C home with labor and preeclampsia precautions F/U in [facility] on Thursday as scheduled Return to MAU as needed      Follow-up Information   Follow up with [facility]. (Keep appointment on Thursday. Return to MAU as needed.)    Specialty:  Obstetrics and Gynecology   Contact information:   [place] [id number] [phone]       Medication List         acetaminophen 325 MG tablet  Commonly known as:  TYLENOL  Take 325 mg by mouth every 6 (six) hours as needed for moderate pain.     cyclobenzaprine 5 MG tablet  Commonly known as:  FLEXERIL  Take 1 tablet (5 mg total) by mouth 3 (three) times daily as needed for muscle spasms.     Prenatal Vitamins 0.8 MG tablet  Take 1 tablet by mouth daily.        Fatima Blank Certified Nurse-Midwife 04/21/2014 3:43 AM

## 2014-04-21 NOTE — MAU Provider Note (Signed)
Attestation of Attending Supervision of Advanced Practitioner (CNM/NP): Evaluation and management procedures were performed by the Advanced Practitioner under my supervision and collaboration.  I have reviewed the Advanced Practitioner's note and chart, and I agree with the management and plan.  Lavonia Drafts 7:35 AM

## 2014-04-21 NOTE — Discharge Instructions (Signed)
Reasons to return to MAU:  1.  Contractions are  5 minutes apart or less, each last 1 minute, these have been going on for 1-2 hours, and you cannot walk or talk during them 2.  You have a large gush of fluid, or a trickle of fluid that will not stop and you have to wear a pad 3.  You have bleeding that is bright red, heavier than spotting--like menstrual bleeding (spotting can be normal in early labor or after a check of your cervix) 4.  You do not feel the baby moving like he/she normally does  Hypertension During Pregnancy Hypertension is also called high blood pressure. It can occur at any time in life and during pregnancy. When you have hypertension, there is extra pressure inside your blood vessels that carry blood from the heart to the rest of your body (arteries). Hypertension during pregnancy can cause problems for you and your baby. Your baby might not weigh as much as it should at birth or might be born early (premature). Very bad cases of hypertension during pregnancy can be life threatening.  Different types of hypertension can occur during pregnancy.   Chronic hypertension. This happens when a woman has hypertension before pregnancy and it continues during pregnancy.  Gestational hypertension. This is when hypertension develops during pregnancy.  Preeclampsia or toxemia of pregnancy. This is a very serious type of hypertension that develops only during pregnancy. It is a disease that affects the whole body (systemic) and can be very dangerous for both mother and baby.  Gestational hypertension and preeclampsia usually go away after your baby is born. Blood pressure generally stabilizes within 6 weeks. Women who have hypertension during pregnancy have a greater chance of developing hypertension later in life or with future pregnancies. RISK FACTORS Some factors make you more likely to develop hypertension during pregnancy. Risk factors include:  Having hypertension before  pregnancy.  Having hypertension during a previous pregnancy.  Being overweight.  Being older than 33.  Being pregnant with more than one baby (multiples).  Having diabetes or kidney problems. SIGNS AND SYMPTOMS Chronic and gestational hypertension rarely cause symptoms. Preeclampsia has symptoms, which may include:  Increased protein in your urine. Your health care provider will check for this at every prenatal visit.  Swelling of your hands and face.  Rapid weight gain.  Headaches.  Visual changes.  Being bothered by light.  Abdominal pain, especially in the right upper area.  Chest pain.  Shortness of breath.  Increased reflexes.  Seizures. Seizures occur with a more severe form of preeclampsia, called eclampsia. DIAGNOSIS   You may be diagnosed with hypertension during a regular prenatal exam. At each visit, tests may include:  Blood pressure checks.  A urine test to check for protein in your urine.  The type of hypertension you are diagnosed with depends on when you developed it. It also depends on your specific blood pressure reading.  Developing hypertension before 20 weeks of pregnancy is consistent with chronic hypertension.  Developing hypertension after 20 weeks of pregnancy is consistent with gestational hypertension.  Hypertension with increased urinary protein is diagnosed as preeclampsia.  Blood pressure measurements that stay above 952 systolic or 841 diastolic are a sign of severe preeclampsia. TREATMENT Treatment for hypertension during pregnancy varies. Treatment depends on the type of hypertension and how serious it is.  If you take medicine for chronic hypertension, you may need to switch medicines.  Drugs called ACE inhibitors should not be taken during  during pregnancy. °· Low-dose aspirin may be suggested for women who have risk factors for preeclampsia. °· If you have gestational hypertension, you may need to take a blood pressure medicine  that is safe during pregnancy. Your health care provider will recommend the appropriate medicine. °· If you have severe preeclampsia, you may need to be in the hospital. Health care providers will watch you and the baby very closely. You also may need to take medicine (magnesium sulfate) to prevent seizures and lower blood pressure. °· Sometimes an early delivery is needed. This may be the case if the condition worsens. It would be done to protect you and the baby. The only cure for preeclampsia is delivery. °HOME CARE INSTRUCTIONS °· Schedule and keep all of your regular appointments for prenatal care. °· Only take over-the-counter or prescription medicines as directed by your health care provider. Tell your health care provider about all medicines you take. °· Eat as little salt as possible. °· Get regular exercise. °· Do not drink alcohol. °· Do not use tobacco products. °· Do not drink products with caffeine. °· Lie on your left side when resting. °SEEK IMMEDIATE MEDICAL CARE IF: °· You have severe abdominal pain. °· You have sudden swelling in the hands, ankles, or face. °· You gain 4 pounds (1.8 kg) or more in 1 week. °· You vomit repeatedly. °· You have vaginal bleeding. °· You do not feel the baby moving as much. °· You have a headache. °· You have blurred or double vision. °· You have muscle twitching or spasms. °· You have shortness of breath. °· You have blue fingernails and lips. °· You have blood in your urine. °MAKE SURE YOU: °· Understand these instructions. °· Will watch your condition. °· Will get help right away if you are not doing well or get worse. °Document Released: 08/21/2011 Document Revised: 09/23/2013 Document Reviewed: 07/02/2013 °[employer]® Patient Information ©2014 [employer]. ° °

## 2014-04-22 ENCOUNTER — Other Ambulatory Visit: Payer: Self-pay | Admitting: Family Medicine

## 2014-04-22 ENCOUNTER — Ambulatory Visit (HOSPITAL_COMMUNITY)
Admission: RE | Admit: 2014-04-22 | Discharge: 2014-04-22 | Disposition: A | Discharge status: Home / Self Care | Payer: Medicaid Other | Source: Ambulatory Visit | Attending: Family Medicine | Admitting: Family Medicine

## 2014-04-22 ENCOUNTER — Ambulatory Visit (INDEPENDENT_AMBULATORY_CARE_PROVIDER_SITE_OTHER): Payer: Medicaid Other | Admitting: Obstetrics & Gynecology

## 2014-04-22 VITALS — BP 137/85 | HR 73

## 2014-04-22 DIAGNOSIS — I10 Essential (primary) hypertension: Secondary | ICD-10-CM

## 2014-04-22 DIAGNOSIS — O99280 Endocrine, nutritional and metabolic diseases complicating pregnancy, unspecified trimester: Secondary | ICD-10-CM

## 2014-04-22 DIAGNOSIS — O10019 Pre-existing essential hypertension complicating pregnancy, unspecified trimester: Secondary | ICD-10-CM | POA: Insufficient documentation

## 2014-04-22 DIAGNOSIS — Z3689 Encounter for other specified antenatal screening: Secondary | ICD-10-CM | POA: Insufficient documentation

## 2014-04-22 DIAGNOSIS — E079 Disorder of thyroid, unspecified: Secondary | ICD-10-CM

## 2014-04-22 DIAGNOSIS — E039 Hypothyroidism, unspecified: Secondary | ICD-10-CM

## 2014-04-22 NOTE — Progress Notes (Signed)
Pt had recent visit to MAU for dehydration,  IOL scheduled at 40 wks on 05/01/2014 @ 0800

## 2014-04-22 NOTE — Progress Notes (Signed)
NST performed today was reviewed and was found to be reactive.  Continue recommended antenatal testing and prenatal care.  Favorable cervix on exam. Stable BP for now, IOL scheduled at 40 weeks. Fetal movement and labor precautions reviewed.

## 2014-04-22 NOTE — Patient Instructions (Signed)
Return to clinic for any obstetric concerns or go to MAU for evaluation  

## 2014-04-23 ENCOUNTER — Telehealth (HOSPITAL_COMMUNITY): Payer: Self-pay | Admitting: *Deleted

## 2014-04-23 NOTE — Telephone Encounter (Signed)
Preadmission screen  

## 2014-04-26 ENCOUNTER — Ambulatory Visit (INDEPENDENT_AMBULATORY_CARE_PROVIDER_SITE_OTHER): Payer: Medicaid Other | Admitting: *Deleted

## 2014-04-26 VITALS — BP 134/73 | HR 78

## 2014-04-26 DIAGNOSIS — O10019 Pre-existing essential hypertension complicating pregnancy, unspecified trimester: Secondary | ICD-10-CM

## 2014-04-26 NOTE — Progress Notes (Signed)
NST reviewed and reactive.  

## 2014-04-29 ENCOUNTER — Other Ambulatory Visit: Payer: Medicaid Other

## 2014-05-01 ENCOUNTER — Encounter: Payer: Self-pay | Admitting: *Deleted

## 2014-05-01 ENCOUNTER — Inpatient Hospital Stay (HOSPITAL_COMMUNITY): Admission: RE | Admit: 2014-05-01 | Payer: Medicaid Other | Source: Ambulatory Visit

## 2014-05-12 ENCOUNTER — Encounter: Payer: Self-pay | Admitting: General Practice

## 2014-06-14 ENCOUNTER — Encounter: Payer: Self-pay | Admitting: *Deleted

## 2014-06-14 ENCOUNTER — Telehealth: Payer: Self-pay | Admitting: *Deleted

## 2014-06-14 ENCOUNTER — Ambulatory Visit: Payer: Medicaid Other | Admitting: Obstetrics and Gynecology

## 2014-06-14 NOTE — Telephone Encounter (Signed)
Chee missed an appointment for postpartum appt.  Called and left a message that she missed an appt- please call to reschedule. Also sent letter.

## 2014-07-09 ENCOUNTER — Encounter (HOSPITAL_COMMUNITY): Payer: Self-pay | Admitting: *Deleted

## 2014-10-18 ENCOUNTER — Encounter (HOSPITAL_COMMUNITY): Payer: Self-pay | Admitting: *Deleted

## 2014-11-17 ENCOUNTER — Encounter: Payer: Self-pay | Admitting: Family Medicine

## 2014-11-18 ENCOUNTER — Encounter: Payer: Self-pay | Admitting: Obstetrics & Gynecology

## 2014-12-14 ENCOUNTER — Ambulatory Visit (INDEPENDENT_AMBULATORY_CARE_PROVIDER_SITE_OTHER): Payer: Medicaid Other

## 2014-12-14 DIAGNOSIS — N926 Irregular menstruation, unspecified: Secondary | ICD-10-CM

## 2014-12-14 DIAGNOSIS — O3680X1 Pregnancy with inconclusive fetal viability, fetus 1: Secondary | ICD-10-CM

## 2014-12-14 LAB — POCT PREGNANCY, URINE: PREG TEST UR: POSITIVE — AB

## 2014-12-14 LAB — US OB COMP LESS 14 WKS

## 2014-12-14 NOTE — Progress Notes (Signed)
Bedside US for viability = single IUP, FHR - 146 per PW doppler, fetal movement present (body and limbs)

## 2014-12-14 NOTE — Progress Notes (Signed)
Patient here today for pregnancy test after having missed periods. Reports LMP 80/3/21-- certain. UPT obtained and positive. Based on LMP patient is [redacted]w[redacted]d with EDD 06/29/15. Initial lab work to be drawn today. Letter of pregnancy verification given. Patient to schedule New OB appointment.

## 2014-12-15 LAB — PRENATAL PROFILE (SOLSTAS)
Antibody Screen: NEGATIVE
Basophils Absolute: 0 10*3/uL (ref 0.0–0.1)
Basophils Relative: 0 % (ref 0–1)
EOS ABS: 0.1 10*3/uL (ref 0.0–0.7)
Eosinophils Relative: 2 % (ref 0–5)
HCT: 32.6 % — ABNORMAL LOW (ref 36.0–46.0)
HEMOGLOBIN: 10.7 g/dL — AB (ref 12.0–15.0)
HIV 1&2 Ab, 4th Generation: NONREACTIVE
Hepatitis B Surface Ag: NEGATIVE
Lymphocytes Relative: 32 % (ref 12–46)
Lymphs Abs: 1.6 10*3/uL (ref 0.7–4.0)
MCH: 24.9 pg — ABNORMAL LOW (ref 26.0–34.0)
MCHC: 32.8 g/dL (ref 30.0–36.0)
MCV: 76 fL — ABNORMAL LOW (ref 78.0–100.0)
MPV: 9.4 fL (ref 8.6–12.4)
Monocytes Absolute: 0.6 10*3/uL (ref 0.1–1.0)
Monocytes Relative: 12 % (ref 3–12)
Neutro Abs: 2.8 10*3/uL (ref 1.7–7.7)
Neutrophils Relative %: 54 % (ref 43–77)
PLATELETS: 370 10*3/uL (ref 150–400)
RBC: 4.29 MIL/uL (ref 3.87–5.11)
RDW: 16.9 % — ABNORMAL HIGH (ref 11.5–15.5)
RH TYPE: POSITIVE
Rubella: 4.68 Index — ABNORMAL HIGH (ref <0.90)
WBC: 5.1 10*3/uL (ref 4.0–10.5)

## 2014-12-16 LAB — HEMOGLOBINOPATHY EVALUATION
HEMOGLOBIN OTHER: 0 %
HGB A2 QUANT: 2.4 % (ref 2.2–3.2)
HGB F QUANT: 0 % (ref 0.0–2.0)
Hgb A: 97.6 % (ref 96.8–97.8)
Hgb S Quant: 0 %

## 2014-12-16 LAB — CULTURE, OB URINE
COLONY COUNT: NO GROWTH
ORGANISM ID, BACTERIA: NO GROWTH

## 2014-12-19 LAB — CANNABANOIDS (GC/LC/MS), URINE: THC-COOH UR CONFIRM: 213 ng/mL — AB (ref <5)

## 2014-12-21 ENCOUNTER — Encounter: Payer: Self-pay | Admitting: Family Medicine

## 2014-12-21 DIAGNOSIS — F1290 Cannabis use, unspecified, uncomplicated: Secondary | ICD-10-CM | POA: Insufficient documentation

## 2014-12-21 LAB — PRESCRIPTION MONITORING PROFILE (19 PANEL)
Amphetamine/Meth: NEGATIVE ng/mL
BENZODIAZEPINE SCREEN, URINE: NEGATIVE ng/mL
BUPRENORPHINE, URINE: NEGATIVE ng/mL
Barbiturate Screen, Urine: NEGATIVE ng/mL
COCAINE METABOLITES: NEGATIVE ng/mL
Carisoprodol, Urine: NEGATIVE ng/mL
Creatinine, Urine: 574.79 mg/dL (ref >20.0)
ECSTASY: NEGATIVE ng/mL
FENTANYL URINE: NEGATIVE ng/mL
MEPERIDINE UR: NEGATIVE ng/mL
METHADONE SCREEN, URINE: NEGATIVE ng/mL
METHAQUALONE SCREEN (URINE): NEGATIVE ng/mL
NITRITES URINE, INITIAL: NEGATIVE ug/mL
Opiate Screen, Urine: NEGATIVE ng/mL
Oxycodone Screen, Ur: NEGATIVE ng/mL
PROPOXYPHENE: NEGATIVE ng/mL
Phencyclidine, Ur: NEGATIVE ng/mL
Tapentadol, urine: NEGATIVE ng/mL
Tramadol Scrn, Ur: NEGATIVE ng/mL
Zolpidem, Urine: NEGATIVE ng/mL
pH, Initial: 5.8 pH (ref 4.5–8.9)

## 2014-12-27 ENCOUNTER — Inpatient Hospital Stay (HOSPITAL_COMMUNITY)
Admission: AD | Admit: 2014-12-27 | Discharge: 2014-12-28 | Disposition: A | Discharge status: Home / Self Care | Payer: Medicaid Other | Source: Ambulatory Visit | Attending: Family Medicine | Admitting: Family Medicine

## 2014-12-27 ENCOUNTER — Telehealth: Payer: Self-pay | Admitting: *Deleted

## 2014-12-27 ENCOUNTER — Encounter (HOSPITAL_COMMUNITY): Payer: Self-pay | Admitting: *Deleted

## 2014-12-27 DIAGNOSIS — Z3A13 13 weeks gestation of pregnancy: Secondary | ICD-10-CM | POA: Diagnosis not present

## 2014-12-27 DIAGNOSIS — O99331 Smoking (tobacco) complicating pregnancy, first trimester: Secondary | ICD-10-CM | POA: Diagnosis not present

## 2014-12-27 DIAGNOSIS — O26891 Other specified pregnancy related conditions, first trimester: Secondary | ICD-10-CM

## 2014-12-27 DIAGNOSIS — F17210 Nicotine dependence, cigarettes, uncomplicated: Secondary | ICD-10-CM | POA: Diagnosis not present

## 2014-12-27 DIAGNOSIS — R51 Headache: Secondary | ICD-10-CM | POA: Diagnosis not present

## 2014-12-27 DIAGNOSIS — R109 Unspecified abdominal pain: Secondary | ICD-10-CM | POA: Diagnosis present

## 2014-12-27 DIAGNOSIS — O10011 Pre-existing essential hypertension complicating pregnancy, first trimester: Secondary | ICD-10-CM | POA: Diagnosis not present

## 2014-12-27 DIAGNOSIS — O10019 Pre-existing essential hypertension complicating pregnancy, unspecified trimester: Secondary | ICD-10-CM

## 2014-12-27 LAB — URINALYSIS, ROUTINE W REFLEX MICROSCOPIC
Bilirubin Urine: NEGATIVE
Glucose, UA: NEGATIVE mg/dL
KETONES UR: NEGATIVE mg/dL
LEUKOCYTES UA: NEGATIVE
Nitrite: NEGATIVE
PH: 6 (ref 5.0–8.0)
Protein, ur: 30 mg/dL — AB
Specific Gravity, Urine: >1.03 — ABNORMAL HIGH (ref 1.005–1.030)
Urobilinogen, UA: 1 mg/dL (ref 0.0–1.0)

## 2014-12-27 LAB — URINE MICROSCOPIC-ADD ON

## 2014-12-27 MED ORDER — ACETAMINOPHEN 325 MG PO TABS
650.0000 mg | ORAL_TABLET | Freq: Once | ORAL | Status: AC
  Administered 2014-12-27: 650 mg via ORAL
  Filled 2014-12-27: qty 2

## 2014-12-27 NOTE — MAU Note (Signed)
Pt reports upper abd pain for the last 3 days, some nausea. Denies vomiting, fever or diarrhea. Also reports her b/p has been elevated and she has a headache. States the last time it was checked was in December and it was 190/95. Is out of her b/p meds.

## 2014-12-27 NOTE — Telephone Encounter (Signed)
Kylie Hicks called and left a message that she needs to find a doctor for high blood pressure. States she has called around and no one will see her since she is pregnant.  [facility] and we discussed that she was diagnosed with chronic high blood pressure with her last pregnancy and was told needs to have a primary care doctor to follow her after pregnancy.  I informed her our doctors would manage her blood pressure medicines during pregnancy only now that she is pregnant again , but she will need a primary care doctor after this pregnancy is over.  I informed her she can talk about this at her visit with our provider at her prenatal visit this week. She voices understanding.

## 2014-12-27 NOTE — MAU Provider Note (Signed)
History     CSN: [id number]  Arrival date and time: 12/27/14 2156   None     Chief Complaint  Patient presents with  . Abdominal Pain  . Headache   HPI  Kylie Hicks is a 29 y.o. [id number] at [redacted]w[redacted]d who presents with upper abdominal pain, headache, and hypertension.  -Upper abdominal pain x 3 days. Sharp pain that comes & goes, lasts for a few seconds at a time. Worse when lying down. Rates pain as 6/10 when it comes; currently not having abdominal pain. Reports nausea, no vomiting.  -Headache since yesterday; frontal. Has had headache like this in the past. Rates 5/10. No aggravating factors. Has not tried treatment.  -Reports hx of chronic hypertension; has not been on meds since last pregnancy. Does not have PCP. Concerned that BP elevated d/t headache. Denies chest pain, SOB, blurry vision.   Currently not nauseated. Denies lower abdominal pain/vaginal bleeding/vaginal discharge. Scheduled to see Dr. Irven Coe on 12/30/14 for first OB visit.   OB History    Gravida Para Term Preterm AB TAB SAB Ectopic Multiple Living   8 6 5 1      6       Past Medical History  Diagnosis Date  . Anemia     with pregnancies  . Incompetent cervix     cerclage  . History of preterm labor, current pregnancy   . Chlamydia   . Hypothyroidism     No meds  . Postpartum depression 2006  . Hypertension     no meds    Past Surgical History  Procedure Laterality Date  . Cervical cerclage    . Pilonidal cyst / sinus excision    . Intrauterine device insertion      Family History  Problem Relation Age of Onset  . Hypertension Mother   . Cancer Sister     History  Substance Use Topics  . Smoking status: Current Every Day Smoker -- 0.25 packs/day for 3 years    Types: Cigarettes  . Smokeless tobacco: Never Used  . Alcohol Use: No    Allergies: No Known Allergies  Prescriptions prior to admission  Medication Sig Dispense Refill Last Dose  . acetaminophen (TYLENOL) 325 MG tablet Take  325 mg by mouth every 6 (six) hours as needed for moderate pain.   Taking  . cyclobenzaprine (FLEXERIL) 5 MG tablet Take 1 tablet (5 mg total) by mouth 3 (three) times daily as needed for muscle spasms. 30 tablet 0 Taking  . Prenatal Multivit-Min-Fe-FA (PRENATAL VITAMINS) 0.8 MG tablet Take 1 tablet by mouth daily. 30 tablet 12 Taking    Review of Systems  Constitutional: Negative.   Eyes: Negative for blurred vision and double vision.  Respiratory: Negative.   Cardiovascular: Negative.   Gastrointestinal: Positive for nausea and abdominal pain (upper abdominal pain). Negative for vomiting.  Genitourinary: Negative.        Denies vaginal bleeding or discharge.   Neurological: Positive for headaches. Negative for dizziness.   Physical Exam   Blood pressure 136/90, pulse 85, temperature 99.4 F (37.4 C), temperature source Oral, resp. rate 16, height 5\' 3"  (1.6 m), weight 210 lb (95.255 kg), SpO2 99 %, unknown if currently breastfeeding.  Physical Exam  Constitutional: She is oriented to person, place, and time. She appears well-developed and well-nourished. No distress.  HENT:  Head: Normocephalic and atraumatic.  Cardiovascular: Normal rate, regular rhythm, normal heart sounds and intact distal pulses.   No murmur heard. Respiratory: Effort normal  and breath sounds normal. No respiratory distress. She has no wheezes.  GI: Soft. Bowel sounds are normal. There is no tenderness.  Neurological: She is alert and oriented to person, place, and time.  Skin: Skin is warm and dry. She is not diaphoretic.  Psychiatric: She has a normal mood and affect. Her behavior is normal.    MAU Course  Procedures   Results for orders placed or performed during the hospital encounter of 12/27/14 (from the past 24 hour(s))  Urinalysis, Routine w reflex microscopic     Status: Abnormal   Collection Time: 12/27/14 11:00 PM  Result Value Ref Range   Color, Urine YELLOW YELLOW   APPearance CLEAR CLEAR    Specific Gravity, Urine >1.030 (H) 1.005 - 1.030   pH 6.0 5.0 - 8.0   Glucose, UA NEGATIVE NEGATIVE mg/dL   Hgb urine dipstick LARGE (A) NEGATIVE   Bilirubin Urine NEGATIVE NEGATIVE   Ketones, ur NEGATIVE NEGATIVE mg/dL   Protein, ur 30 (A) NEGATIVE mg/dL   Urobilinogen, UA 1.0 0.0 - 1.0 mg/dL   Nitrite NEGATIVE NEGATIVE   Leukocytes, UA NEGATIVE NEGATIVE  Urine microscopic-add on     Status: Abnormal   Collection Time: 12/27/14 11:00 PM  Result Value Ref Range   Squamous Epithelial / LPF FEW (A) RARE   WBC, UA 0-2 <3 WBC/hpf   RBC / HPF 7-10 <3 RBC/hpf   Bacteria, UA FEW (A) RARE   Urine-Other MUCOUS PRESENT     MDM Denies nausea or abdominal pain in MAU U/a pending Tylenol 650 for headache Headache relieved with tylenol  Assessment and Plan   1. Benign essential hypertension antepartum   2. Headache in pregnancy, antepartum, first trimester     Plan: Discharge home in stable condition Keep scheduled f/u appt with Dr. Roselie Awkward; discuss hypertension.  Discussed reasons to return to MAU Discussed use of tylenol to treat headaches.    Jorje Guild B 12/27/2014, 11:07 PM   I have seen the patient with the resident/student and agree with the above.  Mathis Bud

## 2014-12-28 DIAGNOSIS — O133 Gestational [pregnancy-induced] hypertension without significant proteinuria, third trimester: Secondary | ICD-10-CM

## 2014-12-28 DIAGNOSIS — R51 Headache: Secondary | ICD-10-CM

## 2014-12-28 DIAGNOSIS — Z3A13 13 weeks gestation of pregnancy: Secondary | ICD-10-CM

## 2014-12-28 DIAGNOSIS — O26891 Other specified pregnancy related conditions, first trimester: Secondary | ICD-10-CM

## 2014-12-28 NOTE — Discharge Instructions (Signed)
First Trimester of Pregnancy The first trimester of pregnancy is from week 1 until the end of week 12 (months 1 through 3). A week after a sperm fertilizes an egg, the egg will implant on the wall of the uterus. This embryo will begin to develop into a baby. Genes from you and your partner are forming the baby. The female genes determine whether the baby is a boy or a girl. At 6-8 weeks, the eyes and face are formed, and the heartbeat can be seen on ultrasound. At the end of 12 weeks, all the baby's organs are formed.  Now that you are pregnant, you will want to do everything you can to have a healthy baby. Two of the most important things are to get good prenatal care and to follow your health care provider's instructions. Prenatal care is all the medical care you receive before the baby's birth. This care will help prevent, find, and treat any problems during the pregnancy and childbirth. BODY CHANGES Your body goes through many changes during pregnancy. The changes vary from woman to woman.   You may gain or lose a couple of pounds at first.  You may feel sick to your stomach (nauseous) and throw up (vomit). If the vomiting is uncontrollable, call your health care provider.  You may tire easily.  You may develop headaches that can be relieved by medicines approved by your health care provider.  You may urinate more often. Painful urination may mean you have a bladder infection.  You may develop heartburn as a result of your pregnancy.  You may develop constipation because certain hormones are causing the muscles that push waste through your intestines to slow down.  You may develop hemorrhoids or swollen, bulging veins (varicose veins).  Your breasts may begin to grow larger and become tender. Your nipples may stick out more, and the tissue that surrounds them (areola) may become darker.  Your gums may bleed and may be sensitive to brushing and flossing.  Dark spots or blotches (chloasma,  mask of pregnancy) may develop on your face. This will likely fade after the baby is born.  Your menstrual periods will stop.  You may have a loss of appetite.  You may develop cravings for certain kinds of food.  You may have changes in your emotions from day to day, such as being excited to be pregnant or being concerned that something may go wrong with the pregnancy and baby.  You may have more vivid and strange dreams.  You may have changes in your hair. These can include thickening of your hair, rapid growth, and changes in texture. Some women also have hair loss during or after pregnancy, or hair that feels dry or thin. Your hair will most likely return to normal after your baby is born. WHAT TO EXPECT AT YOUR PRENATAL VISITS During a routine prenatal visit:  You will be weighed to make sure you and the baby are growing normally.  Your blood pressure will be taken.  Your abdomen will be measured to track your baby's growth.  The fetal heartbeat will be listened to starting around week 10 or 12 of your pregnancy.  Test results from any previous visits will be discussed. Your health care provider may ask you:  How you are feeling.  If you are feeling the baby move.  If you have had any abnormal symptoms, such as leaking fluid, bleeding, severe headaches, or abdominal cramping.  If you have any questions. Other tests   that may be performed during your first trimester include:  Blood tests to find your blood type and to check for the presence of any previous infections. They will also be used to check for low iron levels (anemia) and Rh antibodies. Later in the pregnancy, blood tests for diabetes will be done along with other tests if problems develop.  Urine tests to check for infections, diabetes, or protein in the urine.  An ultrasound to confirm the proper growth and development of the baby.  An amniocentesis to check for possible genetic problems.  Fetal screens for  spina bifida and Down syndrome.  You may need other tests to make sure you and the baby are doing well. HOME CARE INSTRUCTIONS  Medicines  Follow your health care provider's instructions regarding medicine use. Specific medicines may be either safe or unsafe to take during pregnancy.  Take your prenatal vitamins as directed.  If you develop constipation, try taking a stool softener if your health care provider approves. Diet  Eat regular, well-balanced meals. Choose a variety of foods, such as meat or vegetable-based protein, fish, milk and low-fat dairy products, vegetables, fruits, and whole grain breads and cereals. Your health care provider will help you determine the amount of weight gain that is right for you.  Avoid raw meat and uncooked cheese. These carry germs that can cause birth defects in the baby.  Eating four or five small meals rather than three large meals a day may help relieve nausea and vomiting. If you start to feel nauseous, eating a few soda crackers can be helpful. Drinking liquids between meals instead of during meals also seems to help nausea and vomiting.  If you develop constipation, eat more high-fiber foods, such as fresh vegetables or fruit and whole grains. Drink enough fluids to keep your urine clear or pale yellow. Activity and Exercise  Exercise only as directed by your health care provider. Exercising will help you:  Control your weight.  Stay in shape.  Be prepared for labor and delivery.  Experiencing pain or cramping in the lower abdomen or low back is a good sign that you should stop exercising. Check with your health care provider before continuing normal exercises.  Try to avoid standing for long periods of time. Move your legs often if you must stand in one place for a long time.  Avoid heavy lifting.  Wear low-heeled shoes, and practice good posture.  You may continue to have sex unless your health care provider directs you  otherwise. Relief of Pain or Discomfort  Wear a good support bra for breast tenderness.   Take warm sitz baths to soothe any pain or discomfort caused by hemorrhoids. Use hemorrhoid cream if your health care provider approves.   Rest with your legs elevated if you have leg cramps or low back pain.  If you develop varicose veins in your legs, wear support hose. Elevate your feet for 15 minutes, 3-4 times a day. Limit salt in your diet. Prenatal Care  Schedule your prenatal visits by the twelfth week of pregnancy. They are usually scheduled monthly at first, then more often in the last 2 months before delivery.  Write down your questions. Take them to your prenatal visits.  Keep all your prenatal visits as directed by your health care provider. Safety  Wear your seat belt at all times when driving.  Make a list of emergency phone numbers, including numbers for family, friends, the hospital, and police and fire departments. General Tips    Ask your health care provider for a referral to a local prenatal education class. Begin classes no later than at the beginning of month 6 of your pregnancy.  Ask for help if you have counseling or nutritional needs during pregnancy. Your health care provider can offer advice or refer you to specialists for help with various needs.  Do not use hot tubs, steam rooms, or saunas.  Do not douche or use tampons or scented sanitary pads.  Do not cross your legs for long periods of time.  Avoid cat litter boxes and soil used by cats. These carry germs that can cause birth defects in the baby and possibly loss of the fetus by miscarriage or stillbirth.  Avoid all smoking, herbs, alcohol, and medicines not prescribed by your health care provider. Chemicals in these affect the formation and growth of the baby.  Schedule a dentist appointment. At home, brush your teeth with a soft toothbrush and be gentle when you floss. SEEK MEDICAL CARE IF:   You have  dizziness.  You have mild pelvic cramps, pelvic pressure, or nagging pain in the abdominal area.  You have persistent nausea, vomiting, or diarrhea.  You have a bad smelling vaginal discharge.  You have pain with urination.  You notice increased swelling in your face, hands, legs, or ankles. SEEK IMMEDIATE MEDICAL CARE IF:   You have a fever.  You are leaking fluid from your vagina.  You have spotting or bleeding from your vagina.  You have severe abdominal cramping or pain.  You have rapid weight gain or loss.  You vomit blood or material that looks like coffee grounds.  You are exposed to German measles and have never had them.  You are exposed to fifth disease or chickenpox.  You develop a severe headache.  You have shortness of breath.  You have any kind of trauma, such as from a fall or a car accident. Document Released: 11/27/2001 Document Revised: 04/19/2014 Document Reviewed: 10/13/2013 [employer] Patient Information 2015 [employer]. This information is not intended to replace advice given to you by your health care provider. Make sure you discuss any questions you have with your health care provider.  

## 2014-12-30 ENCOUNTER — Ambulatory Visit (INDEPENDENT_AMBULATORY_CARE_PROVIDER_SITE_OTHER): Payer: Medicaid Other | Admitting: Obstetrics & Gynecology

## 2014-12-30 ENCOUNTER — Encounter: Payer: Self-pay | Admitting: Obstetrics & Gynecology

## 2014-12-30 VITALS — BP 131/84 | HR 76 | Temp 98.0°F | Wt 209.6 lb

## 2014-12-30 DIAGNOSIS — O09212 Supervision of pregnancy with history of pre-term labor, second trimester: Secondary | ICD-10-CM

## 2014-12-30 DIAGNOSIS — O09211 Supervision of pregnancy with history of pre-term labor, first trimester: Secondary | ICD-10-CM

## 2014-12-30 LAB — POCT URINALYSIS DIP (DEVICE)
Bilirubin Urine: NEGATIVE
Glucose, UA: NEGATIVE mg/dL
Ketones, ur: NEGATIVE mg/dL
Nitrite: NEGATIVE
Protein, ur: 100 mg/dL — AB
Specific Gravity, Urine: 1.025 (ref 1.005–1.030)
UROBILINOGEN UA: 1 mg/dL (ref 0.0–1.0)
pH: 6.5 (ref 5.0–8.0)

## 2014-12-30 MED ORDER — PRENATAL VITAMINS 0.8 MG PO TABS: 1.0000 | ORAL_TABLET | Freq: Every day | ORAL | Status: DC

## 2014-12-30 NOTE — Progress Notes (Signed)
Patient needs to schedule 1hour with jelly beans; needs Rx for PNV.

## 2014-12-30 NOTE — Patient Instructions (Signed)
Second Trimester of Pregnancy The second trimester is from week 13 through week 28, months 4 through 6. The second trimester is often a time when you feel your best. Your body has also adjusted to being pregnant, and you begin to feel better physically. Usually, morning sickness has lessened or quit completely, you may have more energy, and you may have an increase in appetite. The second trimester is also a time when the fetus is growing rapidly. At the end of the sixth month, the fetus is about 9 inches long and weighs about 1 pounds. You will likely begin to feel the baby move (quickening) between 18 and 20 weeks of the pregnancy. BODY CHANGES Your body goes through many changes during pregnancy. The changes vary from woman to woman.   Your weight will continue to increase. You will notice your lower abdomen bulging out.  You may begin to get stretch marks on your hips, abdomen, and breasts.  You may develop headaches that can be relieved by medicines approved by your health care provider.  You may urinate more often because the fetus is pressing on your bladder.  You may develop or continue to have heartburn as a result of your pregnancy.  You may develop constipation because certain hormones are causing the muscles that push waste through your intestines to slow down.  You may develop hemorrhoids or swollen, bulging veins (varicose veins).  You may have back pain because of the weight gain and pregnancy hormones relaxing your joints between the bones in your pelvis and as a result of a shift in weight and the muscles that support your balance.  Your breasts will continue to grow and be tender.  Your gums may bleed and may be sensitive to brushing and flossing.  Dark spots or blotches (chloasma, mask of pregnancy) may develop on your face. This will likely fade after the baby is born.  A dark line from your belly button to the pubic area (linea nigra) may appear. This will likely fade  after the baby is born.  You may have changes in your hair. These can include thickening of your hair, rapid growth, and changes in texture. Some women also have hair loss during or after pregnancy, or hair that feels dry or thin. Your hair will most likely return to normal after your baby is born. WHAT TO EXPECT AT YOUR PRENATAL VISITS During a routine prenatal visit:  You will be weighed to make sure you and the fetus are growing normally.  Your blood pressure will be taken.  Your abdomen will be measured to track your baby's growth.  The fetal heartbeat will be listened to.  Any test results from the previous visit will be discussed. Your health care provider may ask you:  How you are feeling.  If you are feeling the baby move.  If you have had any abnormal symptoms, such as leaking fluid, bleeding, severe headaches, or abdominal cramping.  If you have any questions. Other tests that may be performed during your second trimester include:  Blood tests that check for:  Low iron levels (anemia).  Gestational diabetes (between 24 and 28 weeks).  Rh antibodies.  Urine tests to check for infections, diabetes, or protein in the urine.  An ultrasound to confirm the proper growth and development of the baby.  An amniocentesis to check for possible genetic problems.  Fetal screens for spina bifida and Down syndrome. HOME CARE INSTRUCTIONS   Avoid all smoking, herbs, alcohol, and unprescribed   drugs. These chemicals affect the formation and growth of the baby.  Follow your health care provider's instructions regarding medicine use. There are medicines that are either safe or unsafe to take during pregnancy.  Exercise only as directed by your health care provider. Experiencing uterine cramps is a good sign to stop exercising.  Continue to eat regular, healthy meals.  Wear a good support bra for breast tenderness.  Do not use hot tubs, steam rooms, or saunas.  Wear your  seat belt at all times when driving.  Avoid raw meat, uncooked cheese, cat litter boxes, and soil used by cats. These carry germs that can cause birth defects in the baby.  Take your prenatal vitamins.  Try taking a stool softener (if your health care provider approves) if you develop constipation. Eat more high-fiber foods, such as fresh vegetables or fruit and whole grains. Drink plenty of fluids to keep your urine clear or pale yellow.  Take warm sitz baths to soothe any pain or discomfort caused by hemorrhoids. Use hemorrhoid cream if your health care provider approves.  If you develop varicose veins, wear support hose. Elevate your feet for 15 minutes, 3-4 times a day. Limit salt in your diet.  Avoid heavy lifting, wear low heel shoes, and practice good posture.  Rest with your legs elevated if you have leg cramps or low back pain.  Visit your dentist if you have not gone yet during your pregnancy. Use a soft toothbrush to brush your teeth and be gentle when you floss.  A sexual relationship may be continued unless your health care provider directs you otherwise.  Continue to go to all your prenatal visits as directed by your health care provider. SEEK MEDICAL CARE IF:   You have dizziness.  You have mild pelvic cramps, pelvic pressure, or nagging pain in the abdominal area.  You have persistent nausea, vomiting, or diarrhea.  You have a bad smelling vaginal discharge.  You have pain with urination. SEEK IMMEDIATE MEDICAL CARE IF:   You have a fever.  You are leaking fluid from your vagina.  You have spotting or bleeding from your vagina.  You have severe abdominal cramping or pain.  You have rapid weight gain or loss.  You have shortness of breath with chest pain.  You notice sudden or extreme swelling of your face, hands, ankles, feet, or legs.  You have not felt your baby move in over an hour.  You have severe headaches that do not go away with  medicine.  You have vision changes. Document Released: 11/27/2001 Document Revised: 12/08/2013 Document Reviewed: 02/03/2013 Exit[employer] Patient Information 2015 [employer]. This information is not intended to replace advice given to you by your health care provider. Make sure you discuss any questions you have with your health care provider.  

## 2014-12-30 NOTE — Progress Notes (Signed)
U/S 01/04/15 @ 1115a with [facility].

## 2014-12-30 NOTE — Progress Notes (Signed)
Nutrition note: 1st visit consult Pt has h/o obesity. Pt has lost 4.4# @ [redacted]w[redacted]d. Pt reports she wasn't eating a lot in the beginning & had some nausea. Pt reports eating 2 meals & 3-4 snacks/d. Pt has not started taking a PNV yet. Pt reports no heartburn. Pt received verbal & written education on general nutrition during pregnancy. Encouraged PNV. Encouraged protein with all meals & snacks. Discussed importance of babies getting some breast milk. Discussed wt gain goals of 11-20# or 0.5#/wk. Pt agrees to start taking a PNV. Pt does not have [place] but plans to apply. Pt does not plan to BF. F/u as needed Vladimir Faster, MS, RD, LDN, [facility]

## 2014-12-31 LAB — GC/CHLAMYDIA PROBE AMP
CT Probe RNA: NEGATIVE
GC Probe RNA: NEGATIVE

## 2015-01-04 ENCOUNTER — Encounter (HOSPITAL_COMMUNITY): Payer: Self-pay

## 2015-01-04 ENCOUNTER — Ambulatory Visit (HOSPITAL_COMMUNITY)
Admission: RE | Admit: 2015-01-04 | Discharge: 2015-01-04 | Disposition: A | Discharge status: Home / Self Care | Payer: Medicaid Other | Source: Ambulatory Visit | Attending: Obstetrics & Gynecology | Admitting: Obstetrics & Gynecology

## 2015-01-04 DIAGNOSIS — O99212 Obesity complicating pregnancy, second trimester: Secondary | ICD-10-CM | POA: Insufficient documentation

## 2015-01-04 DIAGNOSIS — O09292 Supervision of pregnancy with other poor reproductive or obstetric history, second trimester: Secondary | ICD-10-CM | POA: Diagnosis not present

## 2015-01-04 DIAGNOSIS — O09212 Supervision of pregnancy with history of pre-term labor, second trimester: Secondary | ICD-10-CM

## 2015-01-04 DIAGNOSIS — O10012 Pre-existing essential hypertension complicating pregnancy, second trimester: Secondary | ICD-10-CM | POA: Diagnosis not present

## 2015-01-04 DIAGNOSIS — O99282 Endocrine, nutritional and metabolic diseases complicating pregnancy, second trimester: Secondary | ICD-10-CM | POA: Insufficient documentation

## 2015-01-04 DIAGNOSIS — Z1389 Encounter for screening for other disorder: Secondary | ICD-10-CM | POA: Insufficient documentation

## 2015-01-04 DIAGNOSIS — Z3A14 14 weeks gestation of pregnancy: Secondary | ICD-10-CM | POA: Diagnosis not present

## 2015-01-04 DIAGNOSIS — E039 Hypothyroidism, unspecified: Secondary | ICD-10-CM | POA: Insufficient documentation

## 2015-01-10 ENCOUNTER — Other Ambulatory Visit: Payer: Self-pay | Admitting: Obstetrics & Gynecology

## 2015-01-10 DIAGNOSIS — O365921 Maternal care for other known or suspected poor fetal growth, second trimester, fetus 1: Secondary | ICD-10-CM

## 2015-01-10 DIAGNOSIS — Z8751 Personal history of pre-term labor: Secondary | ICD-10-CM

## 2015-01-10 DIAGNOSIS — Z3689 Encounter for other specified antenatal screening: Secondary | ICD-10-CM

## 2015-01-10 DIAGNOSIS — O99212 Obesity complicating pregnancy, second trimester: Secondary | ICD-10-CM

## 2015-01-10 DIAGNOSIS — E0590 Thyrotoxicosis, unspecified without thyrotoxic crisis or storm: Secondary | ICD-10-CM

## 2015-01-10 DIAGNOSIS — O99282 Endocrine, nutritional and metabolic diseases complicating pregnancy, second trimester: Secondary | ICD-10-CM

## 2015-01-10 DIAGNOSIS — O10012 Pre-existing essential hypertension complicating pregnancy, second trimester: Secondary | ICD-10-CM

## 2015-01-18 ENCOUNTER — Encounter: Payer: Self-pay | Admitting: Obstetrics & Gynecology

## 2015-01-27 ENCOUNTER — Ambulatory Visit (INDEPENDENT_AMBULATORY_CARE_PROVIDER_SITE_OTHER): Payer: Medicaid Other | Admitting: Obstetrics and Gynecology

## 2015-01-27 ENCOUNTER — Encounter: Payer: Self-pay | Admitting: Obstetrics and Gynecology

## 2015-01-27 VITALS — BP 137/86 | HR 77 | Wt 213.0 lb

## 2015-01-27 DIAGNOSIS — E039 Hypothyroidism, unspecified: Secondary | ICD-10-CM

## 2015-01-27 DIAGNOSIS — O0942 Supervision of pregnancy with grand multiparity, second trimester: Secondary | ICD-10-CM

## 2015-01-27 DIAGNOSIS — O09212 Supervision of pregnancy with history of pre-term labor, second trimester: Secondary | ICD-10-CM

## 2015-01-27 DIAGNOSIS — O09292 Supervision of pregnancy with other poor reproductive or obstetric history, second trimester: Secondary | ICD-10-CM

## 2015-01-27 DIAGNOSIS — O99282 Endocrine, nutritional and metabolic diseases complicating pregnancy, second trimester: Secondary | ICD-10-CM

## 2015-01-27 DIAGNOSIS — O10019 Pre-existing essential hypertension complicating pregnancy, unspecified trimester: Secondary | ICD-10-CM

## 2015-01-27 LAB — POCT URINALYSIS DIP (DEVICE)
Bilirubin Urine: NEGATIVE
GLUCOSE, UA: NEGATIVE mg/dL
KETONES UR: NEGATIVE mg/dL
Leukocytes, UA: NEGATIVE
Nitrite: NEGATIVE
PH: 6 (ref 5.0–8.0)
Protein, ur: 100 mg/dL — AB
Specific Gravity, Urine: 1.02 (ref 1.005–1.030)
Urobilinogen, UA: 1 mg/dL (ref 0.0–1.0)

## 2015-01-27 NOTE — Progress Notes (Signed)
Pt reports itching around her neck and stomach. She puts cocoa butter on her stomach but nothing is helping itching around her neck.

## 2015-01-27 NOTE — Progress Notes (Signed)
[place] form filled out/faxed

## 2015-01-27 NOTE — Progress Notes (Signed)
Patient did not complete application form for [facility] at last visit. Will complete today with hopes of starting next week

## 2015-01-27 NOTE — Progress Notes (Signed)
Patient is doing well without complaints. Anatomy ultrasound scheduled for 02/01/2015. Advised the use of hydrocortisone cream on back of neck. Patient reports a distant history of atopic dermatitis. Patient to receive 17-P today

## 2015-02-01 ENCOUNTER — Other Ambulatory Visit: Payer: Self-pay | Admitting: Obstetrics & Gynecology

## 2015-02-01 ENCOUNTER — Encounter (HOSPITAL_COMMUNITY): Payer: Self-pay

## 2015-02-01 ENCOUNTER — Ambulatory Visit (HOSPITAL_COMMUNITY)
Admission: RE | Admit: 2015-02-01 | Discharge: 2015-02-01 | Disposition: A | Discharge status: Home / Self Care | Payer: Medicaid Other | Source: Ambulatory Visit | Attending: Obstetrics & Gynecology | Admitting: Obstetrics & Gynecology

## 2015-02-01 DIAGNOSIS — Z8751 Personal history of pre-term labor: Secondary | ICD-10-CM

## 2015-02-01 DIAGNOSIS — O10012 Pre-existing essential hypertension complicating pregnancy, second trimester: Secondary | ICD-10-CM

## 2015-02-01 DIAGNOSIS — Z3A18 18 weeks gestation of pregnancy: Secondary | ICD-10-CM | POA: Diagnosis not present

## 2015-02-01 DIAGNOSIS — O99212 Obesity complicating pregnancy, second trimester: Secondary | ICD-10-CM | POA: Diagnosis not present

## 2015-02-01 DIAGNOSIS — O09292 Supervision of pregnancy with other poor reproductive or obstetric history, second trimester: Secondary | ICD-10-CM | POA: Diagnosis not present

## 2015-02-01 DIAGNOSIS — O99282 Endocrine, nutritional and metabolic diseases complicating pregnancy, second trimester: Secondary | ICD-10-CM

## 2015-02-01 DIAGNOSIS — O365921 Maternal care for other known or suspected poor fetal growth, second trimester, fetus 1: Secondary | ICD-10-CM

## 2015-02-01 DIAGNOSIS — Z3689 Encounter for other specified antenatal screening: Secondary | ICD-10-CM

## 2015-02-01 DIAGNOSIS — O09212 Supervision of pregnancy with history of pre-term labor, second trimester: Secondary | ICD-10-CM | POA: Diagnosis not present

## 2015-02-01 DIAGNOSIS — E0590 Thyrotoxicosis, unspecified without thyrotoxic crisis or storm: Secondary | ICD-10-CM

## 2015-02-03 ENCOUNTER — Ambulatory Visit (INDEPENDENT_AMBULATORY_CARE_PROVIDER_SITE_OTHER): Payer: Medicaid Other | Admitting: *Deleted

## 2015-02-03 ENCOUNTER — Encounter: Payer: Self-pay | Admitting: *Deleted

## 2015-02-03 VITALS — BP 146/97 | HR 92 | Temp 98.6°F

## 2015-02-03 DIAGNOSIS — O162 Unspecified maternal hypertension, second trimester: Secondary | ICD-10-CM

## 2015-02-03 DIAGNOSIS — O9989 Other specified diseases and conditions complicating pregnancy, childbirth and the puerperium: Secondary | ICD-10-CM

## 2015-02-03 DIAGNOSIS — Z8659 Personal history of other mental and behavioral disorders: Secondary | ICD-10-CM

## 2015-02-03 DIAGNOSIS — O09212 Supervision of pregnancy with history of pre-term labor, second trimester: Secondary | ICD-10-CM

## 2015-02-03 DIAGNOSIS — O132 Gestational [pregnancy-induced] hypertension without significant proteinuria, second trimester: Secondary | ICD-10-CM

## 2015-02-03 LAB — CBC
HCT: 31.8 % — ABNORMAL LOW (ref 36.0–46.0)
Hemoglobin: 10.5 g/dL — ABNORMAL LOW (ref 12.0–15.0)
MCH: 25.7 pg — ABNORMAL LOW (ref 26.0–34.0)
MCHC: 33 g/dL (ref 30.0–36.0)
MCV: 77.9 fL — ABNORMAL LOW (ref 78.0–100.0)
MPV: 9.5 fL (ref 8.6–12.4)
Platelets: 320 10*3/uL (ref 150–400)
RBC: 4.08 MIL/uL (ref 3.87–5.11)
RDW: 17.1 % — AB (ref 11.5–15.5)
WBC: 7.1 10*3/uL (ref 4.0–10.5)

## 2015-02-03 LAB — COMPREHENSIVE METABOLIC PANEL
ALK PHOS: 87 U/L (ref 39–117)
ALT: <8 U/L (ref 0–35)
AST: 12 U/L (ref 0–37)
Albumin: 3.1 g/dL — ABNORMAL LOW (ref 3.5–5.2)
BILIRUBIN TOTAL: 0.2 mg/dL (ref 0.2–1.2)
BUN: 7 mg/dL (ref 6–23)
CO2: 22 mEq/L (ref 19–32)
Calcium: 8.5 mg/dL (ref 8.4–10.5)
Chloride: 105 mEq/L (ref 96–112)
Creat: 0.65 mg/dL (ref 0.50–1.10)
GLUCOSE: 70 mg/dL (ref 70–99)
Potassium: 3.9 mEq/L (ref 3.5–5.3)
Sodium: 137 mEq/L (ref 135–145)
Total Protein: 6.2 g/dL (ref 6.0–8.3)

## 2015-02-03 MED ORDER — HYDROXYPROGESTERONE CAPROATE 250 MG/ML IM OIL
250.0000 mg | TOPICAL_OIL | INTRAMUSCULAR | Status: DC
  Administered 2015-02-03 – 2015-03-03 (×4): 250 mg via INTRAMUSCULAR

## 2015-02-03 NOTE — Progress Notes (Signed)
17 P injections initiated/blood pressure elevated, pt reports headaches with no relief from tylenol.  Denies blurred vision or epigastric pain.  Discussed with Dr. Nehemiah Settle, blood pressure elevation. Labs ordered.  Educated patient on signs/symtoms of concern and when to come to [facility] for evaluation.  Pt verbalizes understanding.

## 2015-02-04 LAB — PROTEIN / CREATININE RATIO, URINE
Creatinine, Urine: 138.5 mg/dL
PROTEIN CREATININE RATIO: 0.24 — AB (ref <0.15)
Total Protein, Urine: 33 mg/dL — ABNORMAL HIGH (ref 5–24)

## 2015-02-10 ENCOUNTER — Ambulatory Visit (INDEPENDENT_AMBULATORY_CARE_PROVIDER_SITE_OTHER): Payer: Medicaid Other

## 2015-02-10 VITALS — BP 122/79 | HR 79 | Temp 98.3°F | Wt 218.0 lb

## 2015-02-10 DIAGNOSIS — Z8751 Personal history of pre-term labor: Secondary | ICD-10-CM

## 2015-02-10 NOTE — Progress Notes (Signed)
Patient here today for weekly injection of 17P. 55ml 17P administered into LUO quadrant of buttocks. Patient tolerated well. Patient reported having mild reaction to last injection on right side--reported redness/itching at site for a couple of days. Advised patient try benadryl when she gets home to prevent reaction from occuring with this injection. Patient verbalized understanding and gratitude. No further questions or concerns.

## 2015-02-15 ENCOUNTER — Encounter (HOSPITAL_COMMUNITY): Payer: Self-pay

## 2015-02-15 ENCOUNTER — Ambulatory Visit (HOSPITAL_COMMUNITY)
Admission: RE | Admit: 2015-02-15 | Discharge: 2015-02-15 | Disposition: A | Discharge status: Home / Self Care | Payer: Medicaid Other | Source: Ambulatory Visit | Attending: Obstetrics & Gynecology | Admitting: Obstetrics & Gynecology

## 2015-02-15 DIAGNOSIS — O09292 Supervision of pregnancy with other poor reproductive or obstetric history, second trimester: Secondary | ICD-10-CM | POA: Diagnosis not present

## 2015-02-15 DIAGNOSIS — O99282 Endocrine, nutritional and metabolic diseases complicating pregnancy, second trimester: Secondary | ICD-10-CM | POA: Diagnosis not present

## 2015-02-15 DIAGNOSIS — E0590 Thyrotoxicosis, unspecified without thyrotoxic crisis or storm: Secondary | ICD-10-CM

## 2015-02-15 DIAGNOSIS — O99212 Obesity complicating pregnancy, second trimester: Secondary | ICD-10-CM

## 2015-02-15 DIAGNOSIS — O132 Gestational [pregnancy-induced] hypertension without significant proteinuria, second trimester: Secondary | ICD-10-CM | POA: Insufficient documentation

## 2015-02-15 DIAGNOSIS — E039 Hypothyroidism, unspecified: Secondary | ICD-10-CM | POA: Diagnosis not present

## 2015-02-15 DIAGNOSIS — O365921 Maternal care for other known or suspected poor fetal growth, second trimester, fetus 1: Secondary | ICD-10-CM

## 2015-02-15 DIAGNOSIS — Z8751 Personal history of pre-term labor: Secondary | ICD-10-CM

## 2015-02-15 DIAGNOSIS — Z3A20 20 weeks gestation of pregnancy: Secondary | ICD-10-CM | POA: Diagnosis not present

## 2015-02-15 DIAGNOSIS — O4402 Placenta previa specified as without hemorrhage, second trimester: Secondary | ICD-10-CM | POA: Insufficient documentation

## 2015-02-15 DIAGNOSIS — O10012 Pre-existing essential hypertension complicating pregnancy, second trimester: Secondary | ICD-10-CM

## 2015-02-17 ENCOUNTER — Ambulatory Visit (INDEPENDENT_AMBULATORY_CARE_PROVIDER_SITE_OTHER): Payer: Medicaid Other | Admitting: Family Medicine

## 2015-02-17 VITALS — BP 127/85 | HR 91 | Temp 98.2°F | Wt 215.6 lb

## 2015-02-17 DIAGNOSIS — O10019 Pre-existing essential hypertension complicating pregnancy, unspecified trimester: Secondary | ICD-10-CM

## 2015-02-17 DIAGNOSIS — O09292 Supervision of pregnancy with other poor reproductive or obstetric history, second trimester: Secondary | ICD-10-CM

## 2015-02-17 DIAGNOSIS — Z8751 Personal history of pre-term labor: Secondary | ICD-10-CM

## 2015-02-17 LAB — POCT URINALYSIS DIP (DEVICE)
Glucose, UA: NEGATIVE mg/dL
Leukocytes, UA: NEGATIVE
Nitrite: NEGATIVE
PROTEIN: 100 mg/dL — AB
Urobilinogen, UA: 1 mg/dL (ref 0.0–1.0)
pH: 6 (ref 5.0–8.0)

## 2015-02-17 MED ORDER — FAMOTIDINE 40 MG PO TABS: 40.0000 mg | ORAL_TABLET | Freq: Every day | ORAL | Status: DC

## 2015-02-17 NOTE — Progress Notes (Signed)
Pt complains of abdominal for past 3 days, irregular timing. Pt states that ultrasound showed she was having contractions and her cervix was shortened.

## 2015-02-17 NOTE — Progress Notes (Signed)
Nutrition note: f/u for wt Pt has gained 1.6# @ [redacted]w[redacted]d, which is < expected but pt has gained wt since previous appts 2/11 & 1/14. Pt reports eating 2 meals & 2-3 snacks/d but reports a poor appetite & so sometimes cannot eat a lot. Pt is taking a PNV. Pt reports nausea occ & has been having some heartburn - PCP prescribed pepcid today. Provided handout on 'Small Frequent Meals' & encouraged pt to include protein with all meals/ snacks & try to eat q 2-3hrs. Pt agreeable. F/u as needed Vladimir Faster, MS, RD, LDN, [facility]

## 2015-02-17 NOTE — Patient Instructions (Signed)
Cervical Cerclage Cervical cerclage is a surgical procedure for an incompetent cervix. An incompetent cervix is a weak cervix that opens up before labor begins. Cervical cerclage is a procedure in which the cervix is sewn closed during pregnancy.  LET [facility] CARE PROVIDER KNOW ABOUT:   Any allergies you have.  All medicines you are taking, including vitamins, herbs, eye drops, creams, and over-the-counter medicines.  Previous problems you or members of your family have had with the use of anesthetics.  Any blood disorders you have.  Previous surgeries you have had.  Medical conditions you have.  Any recent colds or infections. RISKS AND COMPLICATIONS  Generally, this is a safe procedure. However, as with any procedure, problems can occur. Possible problems include:  Infection.  Bleeding.  Rupturing the amniotic sac (membranes).  Going into early labor and delivery.  Problems with the anesthetics.  Infection of the amniotic sac. BEFORE THE PROCEDURE   Ask your health care provider about changing or stopping your medicines.  Do not eat or drink anything for 6-8 hours before the procedure.  Arrange for someone to drive you home after the procedure. PROCEDURE   An IV tube will be placed in your vein. You will be given a sedative to help you relax.  You will be given a medicine that makes you sleep through the procedure (general anesthetic) or a medicine injected into your spine that numbs your body below the waist (spinal or epidural anesthetic). You will be asleep or be numbed through the entire procedure.  A speculum will be placed in your vagina to visualize your cervix.  The cervix is then grasped and stitched closed tightly.  Ultrasound may be used to guide the procedure and monitor the baby. AFTER THE PROCEDURE   You will go to a recovery room where you and your unborn baby are monitored. Once you are awake, stable, and taking fluids well, you will be allowed  to return to your room.  You will usually stay in the hospital overnight.  You may get an injection of progesterone to prevent uterine contractions.  You may be given pain-relieving medicines to take with you when you go home.  Have someone drive you home and stay with you for up to 2 days. Document Released: 11/15/2008 Document Revised: 12/08/2013 Document Reviewed: 06/24/2013 [facility] Patient Information 2015 [place], [place]. This information is not intended to replace advice given to you by your health care provider. Make sure you discuss any questions you have with your health care provider.

## 2015-02-17 NOTE — Progress Notes (Signed)
Has short cervix.  She had one contraction during ultrasound, none since then.  Discussed cerclage.  Will schedule for tomorrow with Dr Roselie Awkward.  No other concerns. Having some round ligament pain.  + epigastric pain.  Start on pepcid

## 2015-02-18 ENCOUNTER — Encounter (HOSPITAL_COMMUNITY): Payer: Self-pay | Admitting: Anesthesiology

## 2015-02-18 ENCOUNTER — Inpatient Hospital Stay (HOSPITAL_COMMUNITY): Payer: Medicaid Other | Admitting: Anesthesiology

## 2015-02-18 ENCOUNTER — Encounter (HOSPITAL_COMMUNITY)
Admission: RE | Disposition: A | Discharge status: Home / Self Care | Payer: Self-pay | Source: Ambulatory Visit | Attending: Obstetrics & Gynecology

## 2015-02-18 ENCOUNTER — Telehealth: Payer: Self-pay | Admitting: General Practice

## 2015-02-18 ENCOUNTER — Ambulatory Visit (HOSPITAL_COMMUNITY)
Admission: RE | Admit: 2015-02-18 | Discharge: 2015-02-18 | DRG: 782 | Disposition: A | Discharge status: Home / Self Care | Payer: Medicaid Other | Source: Ambulatory Visit | Attending: Obstetrics & Gynecology | Admitting: Obstetrics & Gynecology

## 2015-02-18 DIAGNOSIS — O99332 Smoking (tobacco) complicating pregnancy, second trimester: Secondary | ICD-10-CM | POA: Diagnosis not present

## 2015-02-18 DIAGNOSIS — F17210 Nicotine dependence, cigarettes, uncomplicated: Secondary | ICD-10-CM | POA: Diagnosis not present

## 2015-02-18 DIAGNOSIS — R011 Cardiac murmur, unspecified: Secondary | ICD-10-CM

## 2015-02-18 DIAGNOSIS — O26872 Cervical shortening, second trimester: Secondary | ICD-10-CM | POA: Insufficient documentation

## 2015-02-18 DIAGNOSIS — O3432 Maternal care for cervical incompetence, second trimester: Secondary | ICD-10-CM | POA: Insufficient documentation

## 2015-02-18 DIAGNOSIS — O09292 Supervision of pregnancy with other poor reproductive or obstetric history, second trimester: Secondary | ICD-10-CM

## 2015-02-18 DIAGNOSIS — Z8751 Personal history of pre-term labor: Secondary | ICD-10-CM

## 2015-02-18 DIAGNOSIS — O10019 Pre-existing essential hypertension complicating pregnancy, unspecified trimester: Secondary | ICD-10-CM

## 2015-02-18 DIAGNOSIS — Z3A21 21 weeks gestation of pregnancy: Secondary | ICD-10-CM | POA: Diagnosis not present

## 2015-02-18 HISTORY — PX: CERVICAL CERCLAGE: SHX1329

## 2015-02-18 LAB — CBC
HCT: 30.6 % — ABNORMAL LOW (ref 36.0–46.0)
Hemoglobin: 10.1 g/dL — ABNORMAL LOW (ref 12.0–15.0)
MCH: 25.8 pg — ABNORMAL LOW (ref 26.0–34.0)
MCHC: 33 g/dL (ref 30.0–36.0)
MCV: 78.3 fL (ref 78.0–100.0)
PLATELETS: 304 10*3/uL (ref 150–400)
RBC: 3.91 MIL/uL (ref 3.87–5.11)
RDW: 15.6 % — ABNORMAL HIGH (ref 11.5–15.5)
WBC: 5.3 10*3/uL (ref 4.0–10.5)

## 2015-02-18 SURGERY — CERCLAGE, CERVIX, VAGINAL APPROACH
Anesthesia: Spinal | Site: Vagina

## 2015-02-18 MED ORDER — LACTATED RINGERS IV SOLN
INTRAVENOUS | Status: DC
  Administered 2015-02-18 (×2): via INTRAVENOUS

## 2015-02-18 MED ORDER — SCOPOLAMINE 1 MG/3DAYS TD PT72
MEDICATED_PATCH | TRANSDERMAL | Status: AC
  Administered 2015-02-18: 1.5 mg via TRANSDERMAL
  Filled 2015-02-18: qty 1

## 2015-02-18 MED ORDER — SCOPOLAMINE 1 MG/3DAYS TD PT72
1.0000 | MEDICATED_PATCH | Freq: Once | TRANSDERMAL | Status: DC
  Administered 2015-02-18: 1.5 mg via TRANSDERMAL

## 2015-02-18 MED ORDER — MEPERIDINE HCL 25 MG/ML IJ SOLN: 6.2500 mg | INTRAMUSCULAR | Status: DC | PRN

## 2015-02-18 MED ORDER — ACETAMINOPHEN-CODEINE #3 300-30 MG PO TABS: 1.0000 | ORAL_TABLET | ORAL | Status: DC | PRN

## 2015-02-18 MED ORDER — METOCLOPRAMIDE HCL 5 MG/ML IJ SOLN: 10.0000 mg | Freq: Once | INTRAMUSCULAR | Status: DC | PRN

## 2015-02-18 MED ORDER — FENTANYL CITRATE 0.05 MG/ML IJ SOLN: 25.0000 ug | INTRAMUSCULAR | Status: DC | PRN

## 2015-02-18 MED ORDER — CEFAZOLIN SODIUM-DEXTROSE 2-3 GM-% IV SOLR
INTRAVENOUS | Status: DC | PRN
  Administered 2015-02-18: 2 g via INTRAVENOUS

## 2015-02-18 MED ORDER — PROPOFOL 10 MG/ML IV BOLUS
INTRAVENOUS | Status: DC | PRN
  Administered 2015-02-18 (×4): 10 mg via INTRAVENOUS
  Administered 2015-02-18: 30 mg via INTRAVENOUS

## 2015-02-18 MED ORDER — BUPIVACAINE IN DEXTROSE 0.75-8.25 % IT SOLN
INTRATHECAL | Status: DC | PRN
  Administered 2015-02-18: 10 mg via INTRATHECAL

## 2015-02-18 SURGICAL SUPPLY — 18 items
CANISTER SUCT 3000ML (MISCELLANEOUS) ×3 IMPLANT
CLOTH BEACON ORANGE TIMEOUT ST (SAFETY) ×3 IMPLANT
COUNTER NEEDLE 1200 MAGNETIC (NEEDLE) ×3 IMPLANT
GLOVE BIO SURGEON STRL SZ 6.5 (GLOVE) ×2 IMPLANT
GLOVE BIO SURGEONS STRL SZ 6.5 (GLOVE) ×1
GLOVE BIOGEL PI IND STRL 7.0 (GLOVE) ×1 IMPLANT
GLOVE BIOGEL PI INDICATOR 7.0 (GLOVE) ×2
GOWN STRL REUS W/TWL LRG LVL3 (GOWN DISPOSABLE) ×6 IMPLANT
NS IRRIG 1000ML POUR BTL (IV SOLUTION) ×3 IMPLANT
PACK VAGINAL MINOR WOMEN LF (CUSTOM PROCEDURE TRAY) ×3 IMPLANT
PAD OB MATERNITY 4.3X12.25 (PERSONAL CARE ITEMS) ×3 IMPLANT
PAD PREP 24X48 CUFFED NSTRL (MISCELLANEOUS) ×3 IMPLANT
SUT PROLENE 1 CT 1 30 (SUTURE) ×3 IMPLANT
TOWEL OR 17X24 6PK STRL BLUE (TOWEL DISPOSABLE) ×6 IMPLANT
TRAY FOLEY CATH 14FR (SET/KITS/TRAYS/PACK) ×3 IMPLANT
TUBING NON-CON 1/4 X 20 CONN (TUBING) ×2 IMPLANT
TUBING NON-CON 1/4 X 20' CONN (TUBING) ×1
YANKAUER SUCT BULB TIP NO VENT (SUCTIONS) ×3 IMPLANT

## 2015-02-18 NOTE — H&P (Signed)
Kylie Hicks is a 29 y.o. female presenting for cerclage due to short cervix on [place]. [id number] [redacted]w[redacted]d The procedure and risk of ROM, bleeding, failure, anesthesia, infection discussed and questions answered Maternal Medical History:  Reason for admission: cerclage  Contractions: Frequency: rare.    Fetal activity: Perceived fetal activity is normal.    Prenatal complications: Short cervix on [place] 05/24/11  Prenatal Complications - Diabetes: none.    OB History    Gravida Para Term Preterm AB TAB SAB Ectopic Multiple Living   8 7 6 1  0 0 0 0 0 7     Past Medical History  Diagnosis Date  . Anemia     with pregnancies  . Incompetent cervix     cerclage  . History of preterm labor, current pregnancy   . Chlamydia   . Hypothyroidism     No meds  . Postpartum depression 2006  . Hypertension     no meds   Past Surgical History  Procedure Laterality Date  . Cervical cerclage    . Pilonidal cyst / sinus excision    . Intrauterine device insertion     Family History: family history includes Cancer in her sister; Hypertension in her mother. Social History:  reports that she has been smoking Cigarettes.  She has a .75 pack-year smoking history. She has never used smokeless tobacco. She reports that she does not drink alcohol or use illicit drugs.   Prenatal Transfer Tool  ROS    Blood pressure 134/84, pulse 83, temperature 98.4 F (36.9 C), temperature source Oral, resp. rate 17, last menstrual period 09/22/2014, SpO2 100 %, unknown if currently breastfeeding. Maternal Exam:  Uterine Assessment: Contraction strength is mild.  Contraction frequency is rare.   Abdomen: Patient reports no abdominal tenderness. Introitus: not evaluated.   Cervix: not evaluated.   Physical Exam  Vitals reviewed. Constitutional: She appears well-developed. No distress.  Cardiovascular: Normal rate.   Respiratory: Effort normal. No respiratory distress.  GI: Soft. There is no tenderness.  20  week size  Musculoskeletal: Normal range of motion.  Skin: Skin is warm and dry.  Psychiatric: She has a normal mood and affect. Her behavior is normal.    Prenatal labs: ABO, Rh: O/POS/-- (12/29 1419) Antibody: NEG (12/29 1419) Rubella: 4.68 (12/29 1419) RPR: NON REAC (12/29 1419)  HBsAg: NEGATIVE (12/29 1419)  HIV: NONREACTIVE (12/29 1419)  GBS: Positive (04/23 0000)   Assessment/Plan: Short cervix h/o incompetent cervix for cerclage as OP   ARNOLD,JAMES 02/18/2015, 9:01 AM

## 2015-02-18 NOTE — Anesthesia Preprocedure Evaluation (Addendum)
Anesthesia Evaluation  Patient identified by MRN, date of birth, ID band Patient awake    Reviewed: Allergy & Precautions, NPO status , Patient's Chart, lab work & pertinent test results  Airway Mallampati: III  TM Distance: >3 FB Neck ROM: Full    Dental no notable dental hx. (+) Teeth Intact   Pulmonary Current Smoker,  breath sounds clear to auscultation  Pulmonary exam normal       Cardiovascular hypertension, negative cardio ROS  Rhythm:Regular Rate:Normal + Systolic murmurs    Neuro/Psych PSYCHIATRIC DISORDERS Depression negative neurological ROS     GI/Hepatic Neg liver ROS, GERD-  Medicated and Controlled,  Endo/Other  Hypothyroidism Obesity  Renal/GU negative Renal ROS  negative genitourinary   Musculoskeletal negative musculoskeletal ROS (+)   Abdominal (+) + obese,   Peds  Hematology  (+) anemia ,   Anesthesia Other Findings Pierced tongue 3/6 SEM right and left USB  Reproductive/Obstetrics (+) Pregnancy Grand multiparity Hx/o preterm delivery Incompetent cervix                          Anesthesia Physical Anesthesia Plan  ASA: II  Anesthesia Plan: Spinal   Post-op Pain Management:    Induction:   Airway Management Planned: Natural Airway  Additional Equipment:   Intra-op Plan:   Post-operative Plan:   Informed Consent: I have reviewed the patients History and Physical, chart, labs and discussed the procedure including the risks, benefits and alternatives for the proposed anesthesia with the patient or authorized representative who has indicated his/her understanding and acceptance.     Plan Discussed with: Anesthesiologist, CRNA and Surgeon  Anesthesia Plan Comments:         Anesthesia Quick Evaluation

## 2015-02-18 NOTE — Op Note (Signed)
Surgeon: Emeterio Reeve   Assistants: none. Anesthesia: Spinal  ASA Class: 2 Procedure: Cervical cerclage Preop diagnosis: Incompetent cervix. Funneling on [place] at [redacted]w[redacted]d weeks gestation Postop diagnosis: Same Estimated blood loss: Less than 5 mL Complications: None Drains: None Counts: Correct  The patient gave written consent for cervical cerclage due to incompetent cervix at [redacted]w[redacted]d weeks gestation. Patient identification was confirmed and she was brought to the OR and spinal anesthesia was induced. She was placed in dorsal lithotomy position. Her perineum and vagina were sterilely prepped and draped. The bladder was drained with a foley catheter. Exam revealed a long cervix, appeared 1 cm dilated. A Graves speculum was placed. The cervix was visualized. 0 Proline suture was use to place the suture at 1, 4, 7, and 10 o'clock and tied at 1200. Good placement was seen with a suture about 3 cm back from the end of the cervix. All instruments were removed. There was minimal bleeding. Patient tolerated the procedure well without complications. She was brought in stable condition to the PACU.  Emeterio Reeve MD 02/18/2015  10:12 AM

## 2015-02-18 NOTE — Telephone Encounter (Signed)
-----   Message from Woodroe Mode, MD sent at 02/18/2015 11:45 AM EST ----- Needs OP cards consult for echocardiogram for murmur

## 2015-02-18 NOTE — Anesthesia Postprocedure Evaluation (Signed)
  Anesthesia Post-op Note  Patient: Kylie Hicks  Procedure(s) Performed: Procedure(s): CERCLAGE CERVICAL (N/A)  Patient Location: PACU  Anesthesia Type:Spinal  Level of Consciousness: awake, alert  and oriented  Airway and Oxygen Therapy: Patient Spontanous Breathing  Post-op Pain: none  Post-op Assessment: Post-op Vital signs reviewed, Patient's Cardiovascular Status Stable, Respiratory Function Stable, Patent Airway, No signs of Nausea or vomiting, Pain level controlled, No headache, No backache, No residual numbness and No residual motor weakness  Post-op Vital Signs: Reviewed and stable  Last Vitals:  Filed Vitals:   02/18/15 1130  BP: 123/66  Pulse: 62  Temp:   Resp: 21    Complications: No apparent anesthesia complications

## 2015-02-18 NOTE — Anesthesia Procedure Notes (Signed)
Spinal Patient location during procedure: OR Start time: 02/18/2015 11:33 AM Staffing Anesthesiologist: Demetry Bendickson A. Performed by: anesthesiologist  Preanesthetic Checklist Completed: patient identified, site marked, surgical consent, pre-op evaluation, timeout performed, IV checked, risks and benefits discussed and monitors and equipment checked Spinal Block Patient position: sitting Prep: site prepped and draped and DuraPrep Patient monitoring: heart rate, cardiac monitor, continuous pulse ox and blood pressure Approach: midline Location: L3-4 Injection technique: single-shot Needle Needle type: Sprotte  Needle gauge: 24 G Needle length: 9 cm Needle insertion depth: 6 cm Assessment Sensory level: T8 Additional Notes Patient tolerated procedure well. Adequate sensory level.

## 2015-02-18 NOTE — Discharge Instructions (Signed)
Cervical Cerclage, Care After Refer to this sheet in the next few weeks. These instructions provide you with information on caring for yourself after your procedure. Your health care provider may also give you more specific instructions. Your treatment has been planned according to current medical practices, but problems sometimes occur. Call your health care provider if you have any problems or questions after your procedure. WHAT TO EXPECT AFTER THE PROCEDURE  After your procedure, it is typical to have the following:  Abdominal cramping.  Vaginal spotting. HOME CARE INSTRUCTIONS   Only take over-the-counter or prescription medicines for pain, discomfort, or fever as directed by your health care provider.  Avoid physical activities and exercise until your health care provider says it is okay.  Do not douche or have sexual intercourse until your health care provider tells you it is okay.  Keep your follow-up surgical and prenatal appointments with your health care provider. SEEK MEDICAL CARE IF:   You have abnormal vaginal discharge.  You have a rash.  You become lightheaded or feel faint.  You have abdominal pain that is not controlled with pain medicine. SEEK IMMEDIATE MEDICAL CARE IF:   You develop vaginal bleeding.  You are leaking fluid or have a gush of fluid from the vagina.  You have a fever.  You faint.  You have uterine contractions.  You feel your baby is not moving as much as usual, or you cannot feel your baby move.  You have chest pain or shortness of breath. Document Released: 09/23/2013 Document Revised: 12/08/2013 Document Reviewed: 09/23/2013 [facility] Patient Information 2015 [employer], [place]. This information is not intended to replace advice given to you by your health care provider. Make sure you discuss any questions you have with your health care provider.  Post Anesthesia Home Care Instructions Activity: Get plenty of rest for the remainder of the  day. A responsible adult should stay with you for 24 hours following the procedure.  For the next 24 hours, DO NOT: -Drive a car -Paediatric nurse -Drink alcoholic beverages -Take any medication unless instructed by your physician -Make any legal decisions or sign important papers. Meals: Start with liquid foods such as gelatin or soup. Progress to regular foods as tolerated. Avoid greasy, spicy, heavy foods. If nausea and/or vomiting occur, drink only clear liquids until the nausea and/or vomiting subsides. Call your physician if vomiting continues.

## 2015-02-18 NOTE — Telephone Encounter (Signed)
Referral placed in epic for [facility] [place] number and line was busy

## 2015-02-18 NOTE — Transfer of Care (Signed)
Immediate Anesthesia Transfer of Care Note  Patient: Kylie Hicks  Procedure(s) Performed: Procedure(s): CERCLAGE CERVICAL (N/A)  Patient Location: PACU  Anesthesia Type:Spinal  Level of Consciousness: awake, alert , oriented and patient cooperative  Airway & Oxygen Therapy: Patient Spontanous Breathing  Post-op Assessment: Report given to RN and Post -op Vital signs reviewed and stable  Post vital signs: Reviewed and stable  Last Vitals:  Filed Vitals:   02/18/15 0840  BP: 134/84  Pulse: 83  Temp: 36.9 C  Resp: 17    Complications: No apparent anesthesia complications

## 2015-02-18 NOTE — Progress Notes (Signed)
Echocardiogram 2D Echocardiogram has been performed.  Kylie Hicks 02/18/2015, 2:07 PM

## 2015-02-21 ENCOUNTER — Encounter (HOSPITAL_COMMUNITY): Payer: Self-pay | Admitting: Obstetrics & Gynecology

## 2015-02-21 NOTE — Telephone Encounter (Signed)
Per chart review appt has been made for 4/12. Called patient to confirm appt and why appt needed- no answer. Left message stating we are trying to reach you in regards to an appt, please call us back at the clinics

## 2015-02-22 NOTE — Telephone Encounter (Signed)
Attempted to contact patient with appointment date/time, no answer, left message for patient to return call to the clinic.  Pt has 17P injection appointment on 3/10, message placed on appt notes to give the patient the appointment.

## 2015-02-23 NOTE — Telephone Encounter (Signed)
Called patient and she states she is already aware of appt and will be here tomorrow for 17p injection. Patient had no questions

## 2015-02-24 ENCOUNTER — Ambulatory Visit: Payer: Medicaid Other

## 2015-03-01 ENCOUNTER — Ambulatory Visit (HOSPITAL_COMMUNITY): Payer: Medicaid Other

## 2015-03-03 ENCOUNTER — Ambulatory Visit (INDEPENDENT_AMBULATORY_CARE_PROVIDER_SITE_OTHER): Payer: Medicaid Other | Admitting: Family

## 2015-03-03 VITALS — BP 133/91 | HR 85 | Wt 216.7 lb

## 2015-03-03 DIAGNOSIS — O4410 Placenta previa with hemorrhage, unspecified trimester: Secondary | ICD-10-CM

## 2015-03-03 DIAGNOSIS — O10019 Pre-existing essential hypertension complicating pregnancy, unspecified trimester: Secondary | ICD-10-CM

## 2015-03-03 DIAGNOSIS — O09292 Supervision of pregnancy with other poor reproductive or obstetric history, second trimester: Secondary | ICD-10-CM

## 2015-03-03 DIAGNOSIS — R002 Palpitations: Secondary | ICD-10-CM | POA: Insufficient documentation

## 2015-03-03 DIAGNOSIS — O4440 Low lying placenta NOS or without hemorrhage, unspecified trimester: Secondary | ICD-10-CM

## 2015-03-03 LAB — POCT URINALYSIS DIP (DEVICE)
Bilirubin Urine: NEGATIVE
GLUCOSE, UA: NEGATIVE mg/dL
Ketones, ur: NEGATIVE mg/dL
LEUKOCYTES UA: NEGATIVE
NITRITE: NEGATIVE
Protein, ur: 100 mg/dL — AB
SPECIFIC GRAVITY, URINE: 1.025 (ref 1.005–1.030)
Urobilinogen, UA: 1 mg/dL (ref 0.0–1.0)
pH: 6 (ref 5.0–8.0)

## 2015-03-03 MED ORDER — CYCLOBENZAPRINE HCL 10 MG PO TABS: 10.0000 mg | ORAL_TABLET | Freq: Every day | ORAL | Status: DC

## 2015-03-03 NOTE — Progress Notes (Signed)
Pt previously reported heart palpitation.  Sent to cardiology for evaluation > echo results in problem list, mild MV regurgitation.  Follow-up appt with cards on 03/29/15.  [place] scheduled 03/09/15 for follow-up.  Discussed low lying placenta.  No report of vaginal bleeding.  17p today.

## 2015-03-03 NOTE — Progress Notes (Signed)
Deanna from [facility] informed me refill will be sent tomorrow-- one dose left in current vial.

## 2015-03-03 NOTE — Progress Notes (Signed)
Called [facility] and spoke to [facility]-- requested refill.

## 2015-03-03 NOTE — Progress Notes (Signed)
Pt reports headaches this week. Also reports some dizziness.  Would like to discuss her echo results.

## 2015-03-05 NOTE — Discharge Summary (Signed)
Obstetric Discharge Summary Reason for Admission: incompetent cervix Prenatal Procedures: cerclage Intrapartum Procedures: n/a Postpartum Procedures: n/a Complications-Operative and Postpartum: none HEMOGLOBIN  Date Value Ref Range Status  02/18/2015 10.1* 12.0 - 15.0 g/dL Final   HCT  Date Value Ref Range Status  02/18/2015 30.6* 36.0 - 46.0 % Final    Physical Exam:  General: alert, cooperative and no distress Incision: n/a DVT Evaluation: No evidence of DVT seen on physical exam.  Discharge Diagnoses: incompetent cervix 21 weeks  Discharge Information: Date: 03/05/2015 Activity: pelvic rest Diet: routine Medications: Tylenol #3 Condition: stable Instructions: cerclage care Discharge to: home Follow-up Information    Follow up with [facility] In 2 weeks.   Specialty:  Obstetrics and Gynecology   Contact information:   [place] [phone]       [place] 03/05/2015, 3:18 PM

## 2015-03-07 ENCOUNTER — Telehealth: Payer: Self-pay | Admitting: General Practice

## 2015-03-07 NOTE — Telephone Encounter (Signed)
Patient called and left message stating she has had constipation for a couple days now and was finally able to go but she has developed hemorrhoids and they are itchy and swollen and just very bothersome. Patient would like call back with advice as to what she can do. Called patient and discussed OTC treatments for hemorrhoids. Patient verbalized understanding and had no other questions

## 2015-03-09 ENCOUNTER — Ambulatory Visit: Payer: Medicaid Other

## 2015-03-09 ENCOUNTER — Ambulatory Visit (HOSPITAL_COMMUNITY)
Admission: RE | Admit: 2015-03-09 | Discharge: 2015-03-09 | Disposition: A | Discharge status: Home / Self Care | Payer: Medicaid Other | Source: Ambulatory Visit | Attending: Obstetrics & Gynecology | Admitting: Obstetrics & Gynecology

## 2015-03-09 ENCOUNTER — Encounter (HOSPITAL_COMMUNITY): Payer: Self-pay

## 2015-03-09 DIAGNOSIS — O4400 Placenta previa specified as without hemorrhage, unspecified trimester: Secondary | ICD-10-CM | POA: Insufficient documentation

## 2015-03-09 DIAGNOSIS — Z8751 Personal history of pre-term labor: Secondary | ICD-10-CM | POA: Insufficient documentation

## 2015-03-09 DIAGNOSIS — O99282 Endocrine, nutritional and metabolic diseases complicating pregnancy, second trimester: Secondary | ICD-10-CM | POA: Diagnosis not present

## 2015-03-09 DIAGNOSIS — O99212 Obesity complicating pregnancy, second trimester: Secondary | ICD-10-CM | POA: Diagnosis not present

## 2015-03-09 DIAGNOSIS — E0590 Thyrotoxicosis, unspecified without thyrotoxic crisis or storm: Secondary | ICD-10-CM | POA: Insufficient documentation

## 2015-03-09 DIAGNOSIS — Z3A24 24 weeks gestation of pregnancy: Secondary | ICD-10-CM | POA: Insufficient documentation

## 2015-03-09 DIAGNOSIS — O365921 Maternal care for other known or suspected poor fetal growth, second trimester, fetus 1: Secondary | ICD-10-CM | POA: Diagnosis not present

## 2015-03-09 DIAGNOSIS — O10012 Pre-existing essential hypertension complicating pregnancy, second trimester: Secondary | ICD-10-CM | POA: Insufficient documentation

## 2015-03-09 DIAGNOSIS — O3432 Maternal care for cervical incompetence, second trimester: Secondary | ICD-10-CM | POA: Insufficient documentation

## 2015-03-09 DIAGNOSIS — E669 Obesity, unspecified: Secondary | ICD-10-CM | POA: Diagnosis not present

## 2015-03-09 DIAGNOSIS — O1012 Pre-existing hypertensive heart disease complicating childbirth: Secondary | ICD-10-CM

## 2015-03-09 DIAGNOSIS — I119 Hypertensive heart disease without heart failure: Secondary | ICD-10-CM | POA: Insufficient documentation

## 2015-03-14 ENCOUNTER — Other Ambulatory Visit (HOSPITAL_COMMUNITY): Payer: Self-pay | Admitting: Maternal and Fetal Medicine

## 2015-03-14 DIAGNOSIS — O4402 Placenta previa specified as without hemorrhage, second trimester: Secondary | ICD-10-CM

## 2015-03-14 DIAGNOSIS — O10012 Pre-existing essential hypertension complicating pregnancy, second trimester: Secondary | ICD-10-CM

## 2015-03-14 DIAGNOSIS — E039 Hypothyroidism, unspecified: Secondary | ICD-10-CM

## 2015-03-14 DIAGNOSIS — O3432 Maternal care for cervical incompetence, second trimester: Secondary | ICD-10-CM

## 2015-03-14 DIAGNOSIS — Z8751 Personal history of pre-term labor: Secondary | ICD-10-CM

## 2015-03-14 DIAGNOSIS — O99212 Obesity complicating pregnancy, second trimester: Secondary | ICD-10-CM

## 2015-03-14 DIAGNOSIS — O09292 Supervision of pregnancy with other poor reproductive or obstetric history, second trimester: Secondary | ICD-10-CM

## 2015-03-17 ENCOUNTER — Encounter: Payer: Medicaid Other | Admitting: Family Medicine

## 2015-03-23 ENCOUNTER — Ambulatory Visit (HOSPITAL_COMMUNITY): Admission: RE | Admit: 2015-03-23 | Payer: Medicaid Other | Source: Ambulatory Visit

## 2015-03-29 ENCOUNTER — Institutional Professional Consult (permissible substitution): Payer: Medicaid Other | Admitting: Cardiology

## 2015-03-31 ENCOUNTER — Encounter (HOSPITAL_COMMUNITY): Payer: Self-pay | Admitting: *Deleted

## 2015-03-31 ENCOUNTER — Inpatient Hospital Stay (HOSPITAL_COMMUNITY)
Admission: AD | Admit: 2015-03-31 | Discharge: 2015-04-01 | Disposition: A | Discharge status: Home / Self Care | Payer: Medicaid Other | Source: Ambulatory Visit | Attending: Obstetrics & Gynecology | Admitting: Obstetrics & Gynecology

## 2015-03-31 DIAGNOSIS — Z3A27 27 weeks gestation of pregnancy: Secondary | ICD-10-CM | POA: Diagnosis not present

## 2015-03-31 DIAGNOSIS — R51 Headache: Secondary | ICD-10-CM | POA: Diagnosis not present

## 2015-03-31 DIAGNOSIS — R05 Cough: Secondary | ICD-10-CM | POA: Insufficient documentation

## 2015-03-31 DIAGNOSIS — O4440 Low lying placenta NOS or without hemorrhage, unspecified trimester: Secondary | ICD-10-CM

## 2015-03-31 DIAGNOSIS — R002 Palpitations: Secondary | ICD-10-CM

## 2015-03-31 DIAGNOSIS — R6889 Other general symptoms and signs: Secondary | ICD-10-CM

## 2015-03-31 DIAGNOSIS — J029 Acute pharyngitis, unspecified: Secondary | ICD-10-CM | POA: Insufficient documentation

## 2015-03-31 DIAGNOSIS — O9989 Other specified diseases and conditions complicating pregnancy, childbirth and the puerperium: Secondary | ICD-10-CM

## 2015-03-31 MED ORDER — OSELTAMIVIR PHOSPHATE 75 MG PO CAPS: 75.0000 mg | ORAL_CAPSULE | Freq: Two times a day (BID) | ORAL | Status: DC

## 2015-03-31 MED ORDER — HYDROCODONE-HOMATROPINE 5-1.5 MG/5ML PO SYRP: 5.0000 mL | ORAL_SOLUTION | Freq: Once | ORAL | Status: DC

## 2015-03-31 MED ORDER — GUAIFENESIN-CODEINE 100-10 MG/5ML PO SOLN
5.0000 mL | Freq: Once | ORAL | Status: AC
  Administered 2015-03-31: 5 mL via ORAL
  Filled 2015-03-31: qty 5

## 2015-03-31 MED ORDER — HYDROCODONE-HOMATROPINE 5-1.5 MG/5ML PO SYRP
5.0000 mL | ORAL_SOLUTION | Freq: Once | ORAL | Status: DC
Start: 2015-03-31 — End: 2015-04-14

## 2015-03-31 MED ORDER — ACETAMINOPHEN 325 MG PO TABS
650.0000 mg | ORAL_TABLET | Freq: Once | ORAL | Status: AC
  Administered 2015-03-31: 650 mg via ORAL
  Filled 2015-03-31: qty 2

## 2015-03-31 NOTE — MAU Note (Addendum)
PNC  - DOWNSTAIRS  IN CLINIC.  -  WAS SEEN  LAST  WEEK     FEELS  SORE  THROAT,  COUGHING- PROD,    CHILLS-  NO FEVER  AT HOME,     HAS H/A  WITH  BODY ACHES.  HAS  LOOSE BM'S-   12X TODAY.   NO  VOMITING.    SAYS  10 MTH OLD DAUGHTER  HAS SAME S/S-  WAS AT DR-  YESTERDAY     TO RETURN ON 4-20.         ALSO  CHILD'S  BROTHER HAS FLU.     HAD CIRCLAGE   -03-2015

## 2015-03-31 NOTE — MAU Provider Note (Signed)
None     Chief Complaint:  Flu like symptoms, confirmed flu exposure  Kylie Hicks is  29 y.o. [phone] at [redacted]w[redacted]d presents complaining of sore throat, sometimes productive cough, chills, body aches, headache, loose BM's for 2 days.  Has been around Daughter's brother, who had a + Influenza test last week. Has not taken temperature.  Obstetrical/Gynecological History: OB History    Gravida Para Term Preterm AB TAB SAB Ectopic Multiple Living   8 7 6 1  0 0 0 0 0 7     Past Medical History: Past Medical History  Diagnosis Date  . Anemia     with pregnancies  . Incompetent cervix     cerclage  . History of preterm labor, current pregnancy   . Chlamydia   . Hypothyroidism     No meds  . Postpartum depression 2006  . Hypertension     no meds    Past Surgical History: Past Surgical History  Procedure Laterality Date  . Cervical cerclage    . Pilonidal cyst / sinus excision    . Intrauterine device insertion    . Cervical cerclage N/A 02/18/2015    Procedure: CERCLAGE CERVICAL;  Surgeon: Woodroe Mode, MD;  Location: [facility];  Service: Gynecology;  Laterality: N/A;    Family History: Family History  Problem Relation Age of Onset  . Hypertension Mother   . Cancer Sister     Social History: History  Substance Use Topics  . Smoking status: Current Every Day Smoker -- 0.25 packs/day for 3 years    Types: Cigarettes  . Smokeless tobacco: Never Used  . Alcohol Use: No    Allergies: No Known Allergies  Meds:  Facility-administered medications prior to admission  Medication Dose Route Frequency Provider Last Rate Last Dose  . hydroxyprogesterone caproate (DELALUTIN) 250 mg/mL injection 250 mg  250 mg Intramuscular Weekly Tanna Savoy Stinson, DO   250 mg at 03/03/15 1142   Prescriptions prior to admission  Medication Sig Dispense Refill Last Dose  . famotidine (PEPCID) 40 MG tablet Take 1 tablet (40 mg total) by mouth daily. 30 tablet 3 03/31/2015 at Unknown time  .  Prenatal Multivit-Min-Fe-FA (PRENATAL VITAMINS) 0.8 MG tablet Take 1 tablet by mouth daily. 30 tablet 12 03/31/2015 at Unknown time  . acetaminophen (TYLENOL) 325 MG tablet Take 325 mg by mouth every 6 (six) hours as needed for moderate pain.   Taking  . cyclobenzaprine (FLEXERIL) 10 MG tablet Take 1 tablet (10 mg total) by mouth at bedtime. 30 tablet 2 Taking    Review of Systems   Constitutional: feels "terrible" Eyes: Negative for visual disturbances Respiratory: Negative for shortness of breath, dyspnea Cardiovascular: Negative for chest pain or palpitations  Gastrointestinal: Negative for vomiting or constipation Genitourinary: Negative for dysuria and urgency Musculoskeletal: POSITIVE for back pain, myalgias  Neurological: Negative for dizziness   Physical Exam  Blood pressure 135/72, pulse 99, temperature 98.8 F (37.1 C), temperature source Oral, resp. rate 20, height 5\' 3"  (1.6 m), weight 102.683 kg (226 lb 6 oz), last menstrual period 09/22/2014, unknown if currently breastfeeding. GENERAL: Well-developed, well-nourished female in no acute distress. Looks like she feels ill THROAT:  Non erythemous LUNGS: Clear to auscultation bilaterally. Coughing HEART: Regular rate and rhythm. ABDOMEN: Soft, nontender, nondistended, gravid.  EXTREMITIES: Nontender, no edema, 2+ distal pulses. DTR's 2+ FHT:  Baseline rate 150 bpm   Variability moderate  Accelerations present   Decelerations none Contractions: Every 0 mins   Labs:  Flu swab pending  Assessment: Kylie Hicks is  29 y.o. [id number] at [redacted]w[redacted]d presents with flu like symptoms with known exposure.  Plan: Influenza A&B test Hycodan 1tsp q 4 hours prn # 120 cc no RF Tylenol for body aches Gargle salt water  CRESENZO-DISHMAN,Chetan Mehring 4/14/201610:48 PM

## 2015-03-31 NOTE — Discharge Instructions (Signed)
Influenza Tests These are tests ordered by your caregiver to determine if you likely have an infection caused by an influenza virus. Influenza is also called "the flu". Test results may help your caregiver make rapid treatment decisions. The test also helps them determine whether or not the flu has come to your community. Influenza testing relies on detecting a virus that is shed in the respiratory secretions of the person infected. Detectable flu virus is usually only shed for the first few days that a person is ill. Therefore, testing must be done during this early time period.  The sample to be tested depends on the testing method being used and the age of the patient. The methods used are usually a:   Nasopharyngeal (NP) swab.  Nasal aspirate.  Nasal wash.  Throat swab under approved circumstances. The flu is a common respiratory illness that typically affects many people each winter season. There are two types of flu virus: A and B. Usually, a single strain of flu virus A will predominate during a particular flu season. However, there may be a mixture of A and B strains causing outbreaks in the community at the same time. The usual symptoms are:  Headache.  Exhaustion.  Fever.  Stuffy nose.  Chills.  Sore throat.  Muscle pains.  Cough. Anti-viral medications have been developed to treat either flu A alone, or both A and B. These medications, if given within 48 hours of the onset of symptoms, can reduce the severity of symptoms. The medications can also reduce the time that a patient is sick by about a day and can be helpful in limiting infection spread to other family or household members. The medications will not help if given after 48 hours of the onset of symptoms. They also do not work against infections caused by other viruses or infections caused by any bacteria.  Rapid flu tests are available and have become the most frequently used test for flu.   Depending on the method,  it may be completed in the caregiver's office in less than 30 minutes. It can also be sent to a lab, with the results available the same day.  Depending on the particular type of test used, it can identify flu A, a mixture of A and B, or differentiate between A and B.  Rapid flu testing can be used to help diagnose this infection, determine treatment options for a patient, and if negative can suggest that an alternative illness may be present. These are not perfect tests because up to 30% of flu infected persons will test negative. Test results will also occasionally be positive when the patient does not have a flu infection. Still, it is a quick way to determine whether someone is more likely than not to have flu. Another test that your caregiver may order is a viral culture.   In this test, the flu virus is actually grown and identified in the lab.  It has the advantage of identifying which viruses (A, B, or even some other virus) and which strains of flu virus are present. It is useful for documenting that flu (A and/or B) has reached a community. It is also useful for identifying outbreaks in particular populations. These populations could be nursing homes, schools, or neighborhoods. Identifying these outbreaks can assist healthcare workers in the prevention and treatment of the flu throughout a community.  Its main disadvantage is that it takes one to several days to get a result. This is less than ideal  for the caregiver who would like to prescribe treatment while the patient is still in the office. Rapid flu tests are typically used within the first 48 hours of symptoms. This helps diagnose flu and determine if anti-viral medications may be of benefit. They can also be ordered within the first week to help identify outbreaks. Viral cultures are usually used to track flu outbreaks. They also identify the particular strain that is causing them. Sometimes an unexpected flu strain will show up. Viral  cultures are also used to identify other viral infections that cause clinical symptoms similar to flu. MEANING OF TEST  Your caregiver will go over your test results with you. They will also discuss the importance of this test. In general, a positive rapid flu test means you most likely have the flu. It does not tell your caregiver how severe your symptoms are likely to be or whether or not you may experience any secondary complications from this infection.  Positive flu cultures can give your caregiver additional information about the strain infecting you. This is often not in time to benefit your recovery. It is, however, useful information for your caregivers to share with other caregivers as they treat your family, friends, and neighbors, and to share with public health agencies in your community who help establish preventive interventions in your community.  Negative flu tests may mean that you have something other than the flu. It could also mean that there is not sufficient virus in the specimen to allow it to be detected.  OBTAINING THE TEST RESULTS  Make sure you receive the test results. Ask as to how these results are to be obtained if you have not been informed. It is your responsibility to obtain your test results.  Your caregiver will provide further instructions or treatment options if necessary. Document Released: 09/12/2005 Document Revised: 02/25/2012 Document Reviewed: 09/08/2009 [facility] Patient Information 2015 [place], [place]. This information is not intended to replace advice given to you by your health care provider. Make sure you discuss any questions you have with your health care provider.

## 2015-04-01 LAB — INFLUENZA PANEL BY PCR (TYPE A & B)
H1N1FLUPCR: NOT DETECTED
Influenza A By PCR: NEGATIVE
Influenza B By PCR: POSITIVE — AB

## 2015-04-04 ENCOUNTER — Encounter: Payer: Self-pay | Admitting: Obstetrics & Gynecology

## 2015-04-04 ENCOUNTER — Other Ambulatory Visit: Payer: Self-pay | Admitting: Obstetrics & Gynecology

## 2015-04-04 DIAGNOSIS — J101 Influenza due to other identified influenza virus with other respiratory manifestations: Secondary | ICD-10-CM | POA: Insufficient documentation

## 2015-04-05 ENCOUNTER — Encounter: Payer: Medicaid Other | Admitting: Obstetrics & Gynecology

## 2015-04-05 ENCOUNTER — Encounter: Payer: Self-pay | Admitting: Obstetrics & Gynecology

## 2015-04-14 ENCOUNTER — Encounter: Payer: Self-pay | Admitting: Obstetrics and Gynecology

## 2015-04-14 ENCOUNTER — Ambulatory Visit (INDEPENDENT_AMBULATORY_CARE_PROVIDER_SITE_OTHER): Payer: Medicaid Other | Admitting: Obstetrics and Gynecology

## 2015-04-14 VITALS — BP 140/89 | HR 97 | Temp 98.5°F | Wt 222.8 lb

## 2015-04-14 DIAGNOSIS — O09293 Supervision of pregnancy with other poor reproductive or obstetric history, third trimester: Secondary | ICD-10-CM

## 2015-04-14 DIAGNOSIS — Z23 Encounter for immunization: Secondary | ICD-10-CM | POA: Diagnosis not present

## 2015-04-14 DIAGNOSIS — Z3493 Encounter for supervision of normal pregnancy, unspecified, third trimester: Secondary | ICD-10-CM

## 2015-04-14 DIAGNOSIS — O10019 Pre-existing essential hypertension complicating pregnancy, unspecified trimester: Secondary | ICD-10-CM

## 2015-04-14 DIAGNOSIS — O0943 Supervision of pregnancy with grand multiparity, third trimester: Secondary | ICD-10-CM

## 2015-04-14 LAB — CBC
HCT: 29.2 % — ABNORMAL LOW (ref 36.0–46.0)
HEMOGLOBIN: 9.5 g/dL — AB (ref 12.0–15.0)
MCH: 25.5 pg — ABNORMAL LOW (ref 26.0–34.0)
MCHC: 32.5 g/dL (ref 30.0–36.0)
MCV: 78.3 fL (ref 78.0–100.0)
MPV: 9.2 fL (ref 8.6–12.4)
Platelets: 352 10*3/uL (ref 150–400)
RBC: 3.73 MIL/uL — AB (ref 3.87–5.11)
RDW: 16.5 % — AB (ref 11.5–15.5)
WBC: 7.9 10*3/uL (ref 4.0–10.5)

## 2015-04-14 LAB — POCT URINALYSIS DIP (DEVICE)
Glucose, UA: NEGATIVE mg/dL
Ketones, ur: NEGATIVE mg/dL
NITRITE: NEGATIVE
PH: 7 (ref 5.0–8.0)
Protein, ur: 100 mg/dL — AB
Specific Gravity, Urine: 1.02 (ref 1.005–1.030)
Urobilinogen, UA: 2 mg/dL — ABNORMAL HIGH (ref 0.0–1.0)

## 2015-04-14 MED ORDER — TETANUS-DIPHTH-ACELL PERTUSSIS 5-2.5-18.5 LF-MCG/0.5 IM SUSP
0.5000 mL | Freq: Once | INTRAMUSCULAR | Status: AC
  Administered 2015-04-14: 0.5 mL via INTRAMUSCULAR

## 2015-04-14 NOTE — Progress Notes (Signed)
28 wk labs with Tdap  Pt desires to come in am with jelly beans for one hour gtt.

## 2015-04-14 NOTE — Progress Notes (Signed)
Patient is doing well without complaints. FM/PTL precautions reviewed. Patient will come in tomorrow for glucose challenge test with jelly beans. Follow up growth ultrasound already scheduled in May

## 2015-04-15 ENCOUNTER — Encounter: Payer: Self-pay | Admitting: Cardiology

## 2015-04-15 LAB — RPR

## 2015-04-15 LAB — HIV ANTIBODY (ROUTINE TESTING W REFLEX): HIV 1&2 Ab, 4th Generation: NONREACTIVE

## 2015-04-20 ENCOUNTER — Telehealth: Payer: Self-pay | Admitting: General Practice

## 2015-04-20 NOTE — Telephone Encounter (Signed)
Patient called into front office stating she recently started having nose bleeds and doesn't know why this is happening. Patient reports occasional headaches but relieved by tylenol. Reports no other symptoms. Discussed with patient if she has been sick recently, had a cold or allergies that can dry your nose out and cause it to bleed. Patient verbalized understanding and had no other questions

## 2015-04-28 ENCOUNTER — Ambulatory Visit (INDEPENDENT_AMBULATORY_CARE_PROVIDER_SITE_OTHER): Payer: Medicaid Other | Admitting: Cardiovascular Disease

## 2015-04-28 ENCOUNTER — Ambulatory Visit (INDEPENDENT_AMBULATORY_CARE_PROVIDER_SITE_OTHER): Payer: Medicaid Other | Admitting: Obstetrics and Gynecology

## 2015-04-28 ENCOUNTER — Encounter: Payer: Self-pay | Admitting: Cardiovascular Disease

## 2015-04-28 ENCOUNTER — Encounter: Payer: Self-pay | Admitting: Obstetrics and Gynecology

## 2015-04-28 VITALS — BP 140/88 | HR 85 | Temp 98.5°F | Wt 224.9 lb

## 2015-04-28 VITALS — BP 132/88 | HR 81 | Ht 63.0 in | Wt 224.4 lb

## 2015-04-28 DIAGNOSIS — I10 Essential (primary) hypertension: Secondary | ICD-10-CM | POA: Diagnosis not present

## 2015-04-28 DIAGNOSIS — R002 Palpitations: Secondary | ICD-10-CM | POA: Diagnosis not present

## 2015-04-28 DIAGNOSIS — O10019 Pre-existing essential hypertension complicating pregnancy, unspecified trimester: Secondary | ICD-10-CM

## 2015-04-28 DIAGNOSIS — O0943 Supervision of pregnancy with grand multiparity, third trimester: Secondary | ICD-10-CM

## 2015-04-28 DIAGNOSIS — O99330 Smoking (tobacco) complicating pregnancy, unspecified trimester: Secondary | ICD-10-CM | POA: Diagnosis not present

## 2015-04-28 DIAGNOSIS — I350 Nonrheumatic aortic (valve) stenosis: Secondary | ICD-10-CM | POA: Diagnosis not present

## 2015-04-28 DIAGNOSIS — O09293 Supervision of pregnancy with other poor reproductive or obstetric history, third trimester: Secondary | ICD-10-CM

## 2015-04-28 LAB — POCT URINALYSIS DIP (DEVICE)
Bilirubin Urine: NEGATIVE
GLUCOSE, UA: NEGATIVE mg/dL
Ketones, ur: NEGATIVE mg/dL
Nitrite: NEGATIVE
Protein, ur: 30 mg/dL — AB
SPECIFIC GRAVITY, URINE: 1.025 (ref 1.005–1.030)
Urobilinogen, UA: 1 mg/dL (ref 0.0–1.0)
pH: 7 (ref 5.0–8.0)

## 2015-04-28 NOTE — Patient Instructions (Signed)
Your physician recommends that you schedule a follow-up appointment as needed with Dr. Claiborne Billings.

## 2015-04-28 NOTE — Progress Notes (Signed)
Patient is doing well without complaints. FM/PTL precautions reviewed. Patient states she will come tomorrow for 1 hr GCT. Follow up ultrasound on 5/18

## 2015-04-28 NOTE — Progress Notes (Signed)
Pt reports having pressure in lower abd then at times leading to her "back giving out"; pt states that she has spasms while she driving Pt would like to come in Friday 04/29/15 for 1 hr with jelly beans

## 2015-04-29 ENCOUNTER — Other Ambulatory Visit: Payer: Medicaid Other

## 2015-04-29 ENCOUNTER — Encounter: Payer: Self-pay | Admitting: Cardiovascular Disease

## 2015-04-29 DIAGNOSIS — I350 Nonrheumatic aortic (valve) stenosis: Secondary | ICD-10-CM | POA: Insufficient documentation

## 2015-04-29 NOTE — Addendum Note (Signed)
Addended by: Shelva Majestic A on: 04/29/2015 10:28 AM   Modules accepted: Level of Service

## 2015-04-29 NOTE — Progress Notes (Addendum)
Patient ID: Kylie Hicks, female   DOB: Nov 08, 1986, 29 y.o.   MRN: [id number]     PATIENT PROFILE: Kylie Hicks is a 29 y.o. female who is referred by Dr. Emeterio Reeve for evaluation of a cardiac murmur.   HPI:  Kylie Hicks is a 29 y.o. female  who currently is pregnant with her eighth child.  She has 7 children, ages 8,, 58, 14, 56, 26, 5, and 1.  She states that her first 5 pregnancies.  She did very well.  The last 2 pregnancies.  She developed hypertension.  She is unaware of any known history of cardiac murmur.  She is now [redacted] weeks pregnant with her due date 06/29/2015.  She recently underwent cervical cerclage for incompetent cervix.  She was found to have a cardiac murmur.  She was referred for an echo Doppler study on 02/18/2015.  This revealed very mild LV dilatation with mild left internal hypertrophy and an ejection fraction of 50-55%.  She had normal diastolic parameters.  Her aortic valve was trileaflet and mildly thickened.  There is very mild stenosis with a mean gradient of 14 and peak gradient of 28.  There was mitral regurgitation.  There was mild left atrial dilatation.  Kylie Hicks is currently smoking one quarter pack of cigarettes per day.  She denies chest pain.  She denies shortness of breath.  She denies palpitations.  There is no presyncope or syncope, and she denies any heart failure symptoms.  She presents for evaluation.  Past Medical History  Diagnosis Date  . Anemia     with pregnancies  . Incompetent cervix     cerclage  . History of preterm labor, current pregnancy   . Chlamydia   . Hypothyroidism     No meds  . Postpartum depression 2006  . Hypertension     no meds  . Influenza B positve PCR on 03/31/15 04/04/2015    Past Surgical History  Procedure Laterality Date  . Cervical cerclage    . Pilonidal cyst / sinus excision    . Intrauterine device insertion    . Cervical cerclage N/A 02/18/2015    Procedure: CERCLAGE CERVICAL;  Surgeon: Woodroe Mode, MD;  Location: [facility];  Service: Gynecology;  Laterality: N/A;    No Known Allergies  Current Outpatient Prescriptions  Medication Sig Dispense Refill  . acetaminophen (TYLENOL) 325 MG tablet Take 325 mg by mouth every 6 (six) hours as needed for moderate pain.    . cyclobenzaprine (FLEXERIL) 10 MG tablet Take 1 tablet (10 mg total) by mouth at bedtime. 30 tablet 2  . famotidine (PEPCID) 40 MG tablet Take 1 tablet (40 mg total) by mouth daily. 30 tablet 3  . Prenatal Multivit-Min-Fe-FA (PRENATAL VITAMINS) 0.8 MG tablet Take 1 tablet by mouth daily. 30 tablet 12   Current Facility-Administered Medications  Medication Dose Route Frequency Provider Last Rate Last Dose  . hydroxyprogesterone caproate (DELALUTIN) 250 mg/mL injection 250 mg  250 mg Intramuscular Weekly Tanna Savoy Stinson, DO   250 mg at 03/03/15 1142   Social history is notable in that she is not married.  All her children have the same father.  She has been smoking since age 36.  She completed high school.  Family History  Problem Relation Age of Onset  . Hypertension Mother   . Cancer Sister    Additional family history is notable in that she is adopted and does not know the status of her  biological parents.  She has 4 brothers and 4 sisters.  ROS General: Negative; No fevers, chills, or night sweats HEENT: Negative; No changes in vision or hearing, sinus congestion, difficulty swallowing Pulmonary: Negative; No cough, wheezing, shortness of breath, hemoptysis Cardiovascular:  See HPI; No chest pain, presyncope, syncope, palpitations, edema GI: Negative; No nausea, vomiting, diarrhea, or abdominal pain GU: Negative; No dysuria, hematuria, or difficulty voiding Musculoskeletal: Negative; no myalgias, joint pain, or weakness Hematologic/Oncologic: Negative; no easy bruising, bleeding Endocrine: Negative; no heat/cold intolerance; no diabetes Neuro: Negative; no changes in balance, headaches Skin: Negative; No  rashes or skin lesions Psychiatric: Negative; No behavioral problems, depression Sleep: Negative; No daytime sleepiness, hypersomnolence, bruxism, restless legs, hypnogagnic hallucinations Other comprehensive 14 point system review is negative   Physical Exam BP 132/88 mmHg  Pulse 81  Ht '5\' 3"'  (1.6 m)  Wt 224 lb 6.4 oz (101.787 kg)  BMI 39.76 kg/m2  LMP 09/22/2014 (Exact Date)  Wt Readings from Last 3 Encounters:  04/28/15 224 lb 6.4 oz (101.787 kg)  04/28/15 224 lb 14.4 oz (102.014 kg)  04/14/15 222 lb 12.8 oz (101.061 kg)   General: Alert, oriented, no distress.  Obese. Skin: normal turgor, no rashes, warm and dry HEENT: Normocephalic, atraumatic. Pupils equal round and reactive to light; sclera anicteric; extraocular muscles intact; Fundi without hemorrhages or exudates.  Left fundi was more difficult to see. Nose without nasal septal hypertrophy Mouth positive for a tongue earing Neck: No JVD, no carotid bruits; normal carotid upstroke Lungs: clear to ausculatation and percussion; no wheezing or rales Chest wall: without tenderness to palpitation Heart: PMI not displaced, RRR, s1 s2 normal, 1/6 systolic murmur, no S3 gallop; no diastolic murmur, no rubs, gallops, thrills, or heaves Abdomen: soft, nontender; no hepatosplenomehaly, BS+; abdominal aorta nontender and not dilated by palpation. Back: no CVA tenderness Pulses 2+ Musculoskeletal: full range of motion, normal strength, no joint deformities Extremities: no clubbing cyanosis or edema, Homan's sign negative  Neurologic: grossly nonfocal; Cranial nerves grossly wnl Psychologic: Normal mood and affect   ECG (independently read by me): Normal sinus rhythm at 81 bpm.  Normal intervals.  No significant ST changes.  LABS:  BMP Latest Ref Rng 02/03/2015 04/21/2014 09/03/2013  Glucose 70 - 99 mg/dL 70 81 164(H)  BUN 6 - 23 mg/dL '7 7 11  ' Creatinine 0.50 - 1.10 mg/dL 0.65 0.81 0.78  Sodium 135 - 145 mEq/L 137 135(L) 137    Potassium 3.5 - 5.3 mEq/L 3.9 3.4(L) 3.9  Chloride 96 - 112 mEq/L 105 99 106  CO2 19 - 32 mEq/L '22 22 20  ' Calcium 8.4 - 10.5 mg/dL 8.5 9.0 9.2     Hepatic Function Latest Ref Rng 02/03/2015 04/21/2014 09/03/2013  Total Protein 6.0 - 8.3 g/dL 6.2 5.7(L) 6.8  Albumin 3.5 - 5.2 g/dL 3.1(L) 2.5(L) 3.9  AST 0 - 37 U/L '12 16 21  ' ALT 0 - 35 U/L <'8 9 15  ' Alk Phosphatase 39 - 117 U/L 87 127(H) 84  Total Bilirubin 0.2 - 1.2 mg/dL 0.2 <0.2(L) 0.2(L)     CBC Latest Ref Rng 04/14/2015 02/18/2015 02/03/2015  WBC 4.0 - 10.5 K/uL 7.9 5.3 7.1  Hemoglobin 12.0 - 15.0 g/dL 9.5(L) 10.1(L) 10.5(L)  Hematocrit 36.0 - 46.0 % 29.2(L) 30.6(L) 31.8(L)  Platelets 150 - 400 K/uL 352 304 320     BNP No results found for: BNP  ProBNP No results found for: PROBNP   Lipid Panel  No results found for: CHOL, TRIG, HDL,  CHOLHDL, VLDL, LDLCALC, LDLDIRECT    RADIOLOGY: No results found.   ASSESSMENT AND PLAN: Kylie Hicks is a 29 year old African-American female who was in her eighth pregnancy and currently [redacted] weeks pregnant.  Her blood pressure today is stable.  EP by me was 130/80.  She did not have any signs of significant edema.  Her cardiac murmur is compatible with mild aortic stenosis.  She also has increased blood volume associated with her pregnancy.  I discussed with her the importance of complete smoking cessation.  She has normal diastolic parameters on her echo Doppler assessment and her LV function is in the normal to low-normal range at 50-55%.  She has a trileaflet aortic valve which has mild increased thickness without significant reduction in excursion, but she has a mean gradient of 14 mm, compatible with mild aortic stenosis.  I feel she is stable to undergo childbirth.  She tells me this most likely will be a C-section with her recent incompetent cervix.  Monitoring will be necessary.  I have recommended in the future, we obtain another echo Doppler study when she is not pregnant and is back  with normal volume status for reevaluation of her valve gradient and chamber dimensions.  Troy Sine, MD, [facility] 04/29/2015 10:10 AM

## 2015-05-04 ENCOUNTER — Ambulatory Visit (HOSPITAL_COMMUNITY)
Admission: RE | Admit: 2015-05-04 | Discharge: 2015-05-04 | Disposition: A | Discharge status: Home / Self Care | Payer: Medicaid Other | Source: Ambulatory Visit | Attending: Obstetrics & Gynecology | Admitting: Obstetrics & Gynecology

## 2015-05-04 ENCOUNTER — Encounter (HOSPITAL_COMMUNITY): Payer: Self-pay

## 2015-05-04 ENCOUNTER — Other Ambulatory Visit (HOSPITAL_COMMUNITY): Payer: Self-pay | Admitting: Maternal and Fetal Medicine

## 2015-05-04 DIAGNOSIS — O4402 Placenta previa specified as without hemorrhage, second trimester: Secondary | ICD-10-CM

## 2015-05-04 DIAGNOSIS — O4403 Placenta previa specified as without hemorrhage, third trimester: Secondary | ICD-10-CM | POA: Insufficient documentation

## 2015-05-04 DIAGNOSIS — Z3A32 32 weeks gestation of pregnancy: Secondary | ICD-10-CM | POA: Diagnosis not present

## 2015-05-04 DIAGNOSIS — Z8751 Personal history of pre-term labor: Secondary | ICD-10-CM

## 2015-05-04 DIAGNOSIS — E039 Hypothyroidism, unspecified: Secondary | ICD-10-CM

## 2015-05-04 DIAGNOSIS — O99212 Obesity complicating pregnancy, second trimester: Secondary | ICD-10-CM

## 2015-05-04 DIAGNOSIS — O3433 Maternal care for cervical incompetence, third trimester: Secondary | ICD-10-CM | POA: Insufficient documentation

## 2015-05-04 DIAGNOSIS — O3432 Maternal care for cervical incompetence, second trimester: Secondary | ICD-10-CM

## 2015-05-04 DIAGNOSIS — O10013 Pre-existing essential hypertension complicating pregnancy, third trimester: Secondary | ICD-10-CM | POA: Insufficient documentation

## 2015-05-04 DIAGNOSIS — O10012 Pre-existing essential hypertension complicating pregnancy, second trimester: Secondary | ICD-10-CM

## 2015-05-04 DIAGNOSIS — O09293 Supervision of pregnancy with other poor reproductive or obstetric history, third trimester: Secondary | ICD-10-CM | POA: Insufficient documentation

## 2015-05-04 DIAGNOSIS — O09292 Supervision of pregnancy with other poor reproductive or obstetric history, second trimester: Secondary | ICD-10-CM

## 2015-05-06 ENCOUNTER — Encounter: Payer: Self-pay | Admitting: General Practice

## 2015-05-09 ENCOUNTER — Ambulatory Visit (HOSPITAL_COMMUNITY): Admission: RE | Admit: 2015-05-09 | Payer: Medicaid Other | Source: Ambulatory Visit

## 2015-05-12 ENCOUNTER — Ambulatory Visit (INDEPENDENT_AMBULATORY_CARE_PROVIDER_SITE_OTHER): Payer: Medicaid Other | Admitting: Family Medicine

## 2015-05-12 ENCOUNTER — Encounter: Payer: Self-pay | Admitting: Family Medicine

## 2015-05-12 VITALS — BP 130/86 | HR 85 | Wt 226.7 lb

## 2015-05-12 DIAGNOSIS — O26873 Cervical shortening, third trimester: Secondary | ICD-10-CM

## 2015-05-12 DIAGNOSIS — O4703 False labor before 37 completed weeks of gestation, third trimester: Secondary | ICD-10-CM | POA: Diagnosis not present

## 2015-05-12 DIAGNOSIS — O10013 Pre-existing essential hypertension complicating pregnancy, third trimester: Secondary | ICD-10-CM

## 2015-05-12 DIAGNOSIS — O10019 Pre-existing essential hypertension complicating pregnancy, unspecified trimester: Secondary | ICD-10-CM

## 2015-05-12 LAB — POCT URINALYSIS DIP (DEVICE)
Bilirubin Urine: NEGATIVE
GLUCOSE, UA: NEGATIVE mg/dL
Ketones, ur: NEGATIVE mg/dL
Leukocytes, UA: NEGATIVE
Nitrite: NEGATIVE
PROTEIN: 100 mg/dL — AB
UROBILINOGEN UA: 1 mg/dL (ref 0.0–1.0)
pH: 6.5 (ref 5.0–8.0)

## 2015-05-12 MED ORDER — BETAMETHASONE SOD PHOS & ACET 6 (3-3) MG/ML IJ SUSP
12.0000 mg | Freq: Every day | INTRAMUSCULAR | Status: AC
  Administered 2015-05-12 – 2015-05-13 (×2): 12 mg via INTRAMUSCULAR

## 2015-05-12 NOTE — Progress Notes (Signed)
Patient is 29 y.o. [id number] [redacted]w[redacted]d.  +FM, denies LOF, VB, vaginal discharge. - NST and AFI pending - feeling lots of pressure and lower back pain. Pain is constant x 2 weeks => 1-2/50/-2, no previous exam documented  ==> NST, if contracting will refer to MAU for follow up exam.  Given chronicity of pain and pressure suspect not preterm labor.  No FFN obtained prior to exam given cerclage  => has rx for flexeril, will pick up  => no signs of preterm labor currently, however given dilation will give betamethasone today, have pt return for 2nd dose tomorrow.  Preterm labor precautions discussed

## 2015-05-12 NOTE — Progress Notes (Signed)
Pt reports feeling increased pressure.

## 2015-05-13 ENCOUNTER — Ambulatory Visit (INDEPENDENT_AMBULATORY_CARE_PROVIDER_SITE_OTHER): Payer: Medicaid Other | Admitting: *Deleted

## 2015-05-13 DIAGNOSIS — O4703 False labor before 37 completed weeks of gestation, third trimester: Secondary | ICD-10-CM | POA: Diagnosis not present

## 2015-05-13 DIAGNOSIS — Z8751 Personal history of pre-term labor: Secondary | ICD-10-CM

## 2015-05-17 ENCOUNTER — Ambulatory Visit (HOSPITAL_COMMUNITY)
Admission: RE | Admit: 2015-05-17 | Discharge: 2015-05-17 | Disposition: A | Discharge status: Home / Self Care | Payer: Medicaid Other | Source: Ambulatory Visit | Attending: Obstetrics & Gynecology | Admitting: Obstetrics & Gynecology

## 2015-05-17 ENCOUNTER — Encounter (HOSPITAL_COMMUNITY): Payer: Self-pay

## 2015-05-17 VITALS — BP 136/82 | HR 82 | Wt 229.0 lb

## 2015-05-17 DIAGNOSIS — O9989 Other specified diseases and conditions complicating pregnancy, childbirth and the puerperium: Secondary | ICD-10-CM | POA: Diagnosis not present

## 2015-05-17 DIAGNOSIS — Z3A00 Weeks of gestation of pregnancy not specified: Secondary | ICD-10-CM | POA: Insufficient documentation

## 2015-05-17 DIAGNOSIS — Z3A33 33 weeks gestation of pregnancy: Secondary | ICD-10-CM | POA: Insufficient documentation

## 2015-05-17 DIAGNOSIS — O4400 Placenta previa specified as without hemorrhage, unspecified trimester: Secondary | ICD-10-CM | POA: Diagnosis present

## 2015-05-17 DIAGNOSIS — J101 Influenza due to other identified influenza virus with other respiratory manifestations: Secondary | ICD-10-CM | POA: Insufficient documentation

## 2015-05-17 DIAGNOSIS — R002 Palpitations: Secondary | ICD-10-CM | POA: Insufficient documentation

## 2015-05-17 DIAGNOSIS — O4440 Low lying placenta NOS or without hemorrhage, unspecified trimester: Secondary | ICD-10-CM

## 2015-05-17 DIAGNOSIS — O10919 Unspecified pre-existing hypertension complicating pregnancy, unspecified trimester: Secondary | ICD-10-CM | POA: Insufficient documentation

## 2015-05-19 ENCOUNTER — Encounter: Payer: Self-pay | Admitting: *Deleted

## 2015-05-19 ENCOUNTER — Other Ambulatory Visit: Payer: Medicaid Other

## 2015-05-20 ENCOUNTER — Ambulatory Visit (HOSPITAL_COMMUNITY): Payer: Medicaid Other

## 2015-05-20 ENCOUNTER — Other Ambulatory Visit (HOSPITAL_COMMUNITY): Payer: Medicaid Other

## 2015-05-23 ENCOUNTER — Inpatient Hospital Stay (HOSPITAL_COMMUNITY)
Admission: AD | Admit: 2015-05-23 | Discharge: 2015-05-24 | Disposition: A | Discharge status: Home / Self Care | Payer: Medicaid Other | Source: Ambulatory Visit | Attending: Family Medicine | Admitting: Family Medicine

## 2015-05-23 ENCOUNTER — Encounter (HOSPITAL_COMMUNITY): Payer: Self-pay | Admitting: *Deleted

## 2015-05-23 DIAGNOSIS — Z3A34 34 weeks gestation of pregnancy: Secondary | ICD-10-CM | POA: Insufficient documentation

## 2015-05-23 DIAGNOSIS — B309 Viral conjunctivitis, unspecified: Secondary | ICD-10-CM | POA: Insufficient documentation

## 2015-05-23 DIAGNOSIS — H109 Unspecified conjunctivitis: Secondary | ICD-10-CM

## 2015-05-23 DIAGNOSIS — O9989 Other specified diseases and conditions complicating pregnancy, childbirth and the puerperium: Secondary | ICD-10-CM | POA: Diagnosis present

## 2015-05-23 DIAGNOSIS — R109 Unspecified abdominal pain: Secondary | ICD-10-CM | POA: Diagnosis not present

## 2015-05-23 LAB — URINALYSIS, ROUTINE W REFLEX MICROSCOPIC
Bilirubin Urine: NEGATIVE
GLUCOSE, UA: NEGATIVE mg/dL
Ketones, ur: NEGATIVE mg/dL
NITRITE: NEGATIVE
Protein, ur: NEGATIVE mg/dL
SPECIFIC GRAVITY, URINE: 1.02 (ref 1.005–1.030)
UROBILINOGEN UA: 2 mg/dL — AB (ref 0.0–1.0)
pH: 6 (ref 5.0–8.0)

## 2015-05-23 LAB — URINE MICROSCOPIC-ADD ON

## 2015-05-23 MED ORDER — NAPHAZOLINE HCL 0.1 % OP SOLN: 2.0000 [drp] | Freq: Four times a day (QID) | OPHTHALMIC | Status: DC | PRN

## 2015-05-23 MED ORDER — GLYCERIN-HYPROMELLOSE-PEG 400 0.2-0.2-1 % OP SOLN
2.0000 [drp] | OPHTHALMIC | Status: DC | PRN
  Administered 2015-05-23: 23:00:00 via OPHTHALMIC
  Filled 2015-05-23 (×2): qty 15
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
## 2015-05-23 NOTE — MAU Note (Signed)
Pt states that her right eye has been pink and painful, the nurse was suppose to call her back but never did. This started 3 days ago.

## 2015-05-24 ENCOUNTER — Other Ambulatory Visit: Payer: Medicaid Other

## 2015-05-24 DIAGNOSIS — O9989 Other specified diseases and conditions complicating pregnancy, childbirth and the puerperium: Secondary | ICD-10-CM | POA: Diagnosis not present

## 2015-05-25 ENCOUNTER — Other Ambulatory Visit (HOSPITAL_COMMUNITY): Payer: Self-pay | Admitting: Maternal and Fetal Medicine

## 2015-05-25 ENCOUNTER — Ambulatory Visit (HOSPITAL_COMMUNITY): Payer: Medicaid Other

## 2015-05-25 DIAGNOSIS — O4403 Placenta previa specified as without hemorrhage, third trimester: Secondary | ICD-10-CM

## 2015-05-25 DIAGNOSIS — O3433 Maternal care for cervical incompetence, third trimester: Secondary | ICD-10-CM

## 2015-05-25 DIAGNOSIS — E039 Hypothyroidism, unspecified: Secondary | ICD-10-CM

## 2015-05-25 DIAGNOSIS — O99213 Obesity complicating pregnancy, third trimester: Secondary | ICD-10-CM

## 2015-05-25 DIAGNOSIS — O10013 Pre-existing essential hypertension complicating pregnancy, third trimester: Secondary | ICD-10-CM

## 2015-05-25 DIAGNOSIS — Z8751 Personal history of pre-term labor: Secondary | ICD-10-CM

## 2015-05-25 DIAGNOSIS — O09299 Supervision of pregnancy with other poor reproductive or obstetric history, unspecified trimester: Secondary | ICD-10-CM

## 2015-05-25 MED FILL — Polyvinyl Alcohol Ophth Soln 1.4%: OPHTHALMIC | Qty: 15 | Status: AC

## 2015-05-26 ENCOUNTER — Encounter (HOSPITAL_COMMUNITY): Payer: Self-pay

## 2015-05-26 ENCOUNTER — Other Ambulatory Visit (HOSPITAL_COMMUNITY): Payer: Self-pay | Admitting: Maternal and Fetal Medicine

## 2015-05-26 ENCOUNTER — Ambulatory Visit (HOSPITAL_COMMUNITY)
Admission: RE | Admit: 2015-05-26 | Discharge: 2015-05-26 | Disposition: A | Discharge status: Home / Self Care | Payer: Medicaid Other | Source: Ambulatory Visit | Attending: Family Medicine | Admitting: Family Medicine

## 2015-05-26 ENCOUNTER — Ambulatory Visit (INDEPENDENT_AMBULATORY_CARE_PROVIDER_SITE_OTHER): Payer: Medicaid Other | Admitting: Obstetrics & Gynecology

## 2015-05-26 ENCOUNTER — Encounter: Payer: Self-pay | Admitting: Obstetrics & Gynecology

## 2015-05-26 VITALS — BP 134/84 | HR 85 | Wt 227.0 lb

## 2015-05-26 DIAGNOSIS — Z3A35 35 weeks gestation of pregnancy: Secondary | ICD-10-CM | POA: Insufficient documentation

## 2015-05-26 DIAGNOSIS — O10013 Pre-existing essential hypertension complicating pregnancy, third trimester: Secondary | ICD-10-CM

## 2015-05-26 DIAGNOSIS — O4403 Placenta previa specified as without hemorrhage, third trimester: Secondary | ICD-10-CM

## 2015-05-26 DIAGNOSIS — O09299 Supervision of pregnancy with other poor reproductive or obstetric history, unspecified trimester: Secondary | ICD-10-CM

## 2015-05-26 DIAGNOSIS — E039 Hypothyroidism, unspecified: Secondary | ICD-10-CM

## 2015-05-26 DIAGNOSIS — O09293 Supervision of pregnancy with other poor reproductive or obstetric history, third trimester: Secondary | ICD-10-CM | POA: Insufficient documentation

## 2015-05-26 DIAGNOSIS — O10913 Unspecified pre-existing hypertension complicating pregnancy, third trimester: Secondary | ICD-10-CM | POA: Diagnosis present

## 2015-05-26 DIAGNOSIS — Z8751 Personal history of pre-term labor: Secondary | ICD-10-CM

## 2015-05-26 DIAGNOSIS — O3433 Maternal care for cervical incompetence, third trimester: Secondary | ICD-10-CM

## 2015-05-26 DIAGNOSIS — O99213 Obesity complicating pregnancy, third trimester: Secondary | ICD-10-CM | POA: Insufficient documentation
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
## 2015-05-26 NOTE — Patient Instructions (Signed)

## 2015-05-26 NOTE — Progress Notes (Signed)
NST reactive today, growth [place] is today Subjective:mucus d/c   Kylie Hicks is a 29 y.o. [phone] at [redacted]w[redacted]d being seen today for ongoing prenatal care.  Patient reports mucus d/c.  Contractions: Not present.   . Movement: Present. Denies leaking of fluid.   The following portions of the patient's history were reviewed and updated as appropriate: allergies, current medications, past family history, past medical history, past social history, past surgical history and problem list.   Objective:   Filed Vitals:   05/26/15 1003  BP: 134/84  Pulse: 85  Weight: 227 lb (102.967 kg)    Fetal Status: Fetal Heart Rate (bpm): NST   Movement: Present     General:  Alert, oriented and cooperative. Patient is in no acute distress.  Skin: Skin is warm and dry. No rash noted.   Cardiovascular: Normal heart rate noted  Respiratory: Effort and breath sounds normal, no problems with respiration noted  Abdomen: Soft, gravid, appropriate for gestational age. Pain/Pressure: Present     Vaginal:  .       Cervix: Deferred  Extremities: Normal range of motion.  Edema: None  Mental Status: Normal mood and affect. Normal behavior. Normal judgment and thought content.   Urinalysis: Urine Protein: 1+     Assessment and Plan:   Pregnancy: [id number] at [redacted]w[redacted]d  1. Chronic hypertension in pregnancy, third trimester Control with no med - US OB Limited; Future F/u growth today 2. Prior pregnancy with incompetent cervix, antepartum, third trimester Cerclage in place   Preterm labor symptoms and general obstetric precautions including but not limited to vaginal bleeding, contractions, leaking of fluid and fetal movement were reviewed in detail with the patient.  Please refer to After Visit Summary for other counseling recommendations.   Return in about 4 days (around 05/30/2015) for 2x/wk as scheduled.   Woodroe Mode, MD

## 2015-05-27 LAB — POCT URINALYSIS DIP (DEVICE)
BILIRUBIN URINE: NEGATIVE
Glucose, UA: NEGATIVE mg/dL
Ketones, ur: NEGATIVE mg/dL
Nitrite: NEGATIVE
PROTEIN: 30 mg/dL — AB
SPECIFIC GRAVITY, URINE: 1.025 (ref 1.005–1.030)
UROBILINOGEN UA: 1 mg/dL (ref 0.0–1.0)
pH: 6 (ref 5.0–8.0)

## 2015-05-30 ENCOUNTER — Other Ambulatory Visit: Payer: Medicaid Other

## 2015-06-02 ENCOUNTER — Other Ambulatory Visit: Payer: Self-pay | Admitting: Obstetrics and Gynecology

## 2015-06-02 ENCOUNTER — Ambulatory Visit (HOSPITAL_COMMUNITY)
Admission: RE | Admit: 2015-06-02 | Discharge: 2015-06-02 | Disposition: A | Discharge status: Home / Self Care | Payer: Medicaid Other | Source: Ambulatory Visit | Attending: Obstetrics and Gynecology | Admitting: Obstetrics and Gynecology

## 2015-06-02 ENCOUNTER — Encounter: Payer: Self-pay | Admitting: Obstetrics and Gynecology

## 2015-06-02 ENCOUNTER — Ambulatory Visit (INDEPENDENT_AMBULATORY_CARE_PROVIDER_SITE_OTHER): Payer: Medicaid Other | Admitting: Obstetrics and Gynecology

## 2015-06-02 VITALS — BP 131/81 | HR 90 | Wt 228.9 lb

## 2015-06-02 DIAGNOSIS — E039 Hypothyroidism, unspecified: Secondary | ICD-10-CM | POA: Diagnosis not present

## 2015-06-02 DIAGNOSIS — O09293 Supervision of pregnancy with other poor reproductive or obstetric history, third trimester: Secondary | ICD-10-CM | POA: Diagnosis not present

## 2015-06-02 DIAGNOSIS — Z3A36 36 weeks gestation of pregnancy: Secondary | ICD-10-CM | POA: Insufficient documentation

## 2015-06-02 DIAGNOSIS — O10019 Pre-existing essential hypertension complicating pregnancy, unspecified trimester: Secondary | ICD-10-CM

## 2015-06-02 DIAGNOSIS — O288 Other abnormal findings on antenatal screening of mother: Secondary | ICD-10-CM

## 2015-06-02 DIAGNOSIS — O10013 Pre-existing essential hypertension complicating pregnancy, third trimester: Secondary | ICD-10-CM | POA: Insufficient documentation

## 2015-06-02 DIAGNOSIS — O283 Abnormal ultrasonic finding on antenatal screening of mother: Secondary | ICD-10-CM | POA: Insufficient documentation

## 2015-06-02 DIAGNOSIS — Z118 Encounter for screening for other infectious and parasitic diseases: Secondary | ICD-10-CM

## 2015-06-02 DIAGNOSIS — O0943 Supervision of pregnancy with grand multiparity, third trimester: Secondary | ICD-10-CM | POA: Diagnosis not present

## 2015-06-02 DIAGNOSIS — Z113 Encounter for screening for infections with a predominantly sexual mode of transmission: Secondary | ICD-10-CM | POA: Diagnosis not present

## 2015-06-02 DIAGNOSIS — O99283 Endocrine, nutritional and metabolic diseases complicating pregnancy, third trimester: Secondary | ICD-10-CM | POA: Insufficient documentation

## 2015-06-02 DIAGNOSIS — O10913 Unspecified pre-existing hypertension complicating pregnancy, third trimester: Secondary | ICD-10-CM | POA: Diagnosis not present

## 2015-06-02 DIAGNOSIS — O289 Unspecified abnormal findings on antenatal screening of mother: Secondary | ICD-10-CM | POA: Diagnosis not present

## 2015-06-02 LAB — POCT URINALYSIS DIP (DEVICE)
Glucose, UA: NEGATIVE mg/dL
KETONES UR: NEGATIVE mg/dL
Leukocytes, UA: NEGATIVE
Nitrite: NEGATIVE
PH: 6 (ref 5.0–8.0)
Protein, ur: 100 mg/dL — AB
Urobilinogen, UA: 4 mg/dL — ABNORMAL HIGH (ref 0.0–1.0)

## 2015-06-02 LAB — OB RESULTS CONSOLE GBS: GBS: POSITIVE

## 2015-06-02 LAB — HEMOGLOBIN A1C
HEMOGLOBIN A1C: 5.4 % (ref <5.7)
Mean Plasma Glucose: 108 mg/dL (ref <117)

## 2015-06-02 LAB — GLUCOSE, CAPILLARY: Glucose-Capillary: 69 mg/dL (ref 65–99)

## 2015-06-02 NOTE — Progress Notes (Signed)
Moderate Hgb on Udip

## 2015-06-02 NOTE — Progress Notes (Signed)
Subjective:  Kylie Hicks is a 29 y.o. [phone] at [redacted]w[redacted]d being seen today for ongoing prenatal care.  Patient reports no complaints.  Contractions: Not present.  Vag. Bleeding: None. Movement: Present. Denies leaking of fluid.   The following portions of the patient's history were reviewed and updated as appropriate: allergies, current medications, past family history, past medical history, past social history, past surgical history and problem list.   Objective:   Filed Vitals:   06/02/15 1535  BP: 131/81  Pulse: 90  Weight: 228 lb 14.4 oz (103.828 kg)    Fetal Status: Fetal Heart Rate (bpm): NST   Movement: Present     General:  Alert, oriented and cooperative. Patient is in no acute distress.  Skin: Skin is warm and dry. No rash noted.   Cardiovascular: Normal heart rate noted  Respiratory: Effort and breath sounds normal, no problems with respiration noted  Abdomen: Soft, gravid, appropriate for gestational age. Pain/Pressure: Present     Vaginal: Vag. Bleeding: None.       Cervix: 1, thick , posterior  Extremities: Normal range of motion.  Edema: None  Mental Status: Normal mood and affect. Normal behavior. Normal judgment and thought content.   Urinalysis: Urine Protein: 2+ Urine Glucose: Negative  Assessment and Plan:  Pregnancy: [id number] at [redacted]w[redacted]d  1. Chronic hypertension in pregnancy, third trimester BP stable Cultures collected today NST reviewed and non-reactive- Patient for BPP and AFI Patient never had 1 hr glucola done. Will obtain random today and HgA1C  2. Prior pregnancy with incompetent cervix, antepartum, third trimester Patient with cerclage in place for cerclage removal at next visit  3. Benign essential hypertension antepartum   4. Grand multiparity, antepartum, third trimester    Preterm labor symptoms and general obstetric precautions including but not limited to vaginal bleeding, contractions, leaking of fluid and fetal movement were reviewed in  detail with the patient.  Please refer to After Visit Summary for other counseling recommendations.   Return for 2x/wk as scheduled.   Mora Bellman, MD

## 2015-06-03 LAB — GC/CHLAMYDIA PROBE AMP
CT Probe RNA: NEGATIVE
GC PROBE AMP APTIMA: NEGATIVE

## 2015-06-04 LAB — CULTURE, BETA STREP (GROUP B ONLY)

## 2015-06-06 ENCOUNTER — Other Ambulatory Visit: Payer: Medicaid Other

## 2015-06-09 ENCOUNTER — Ambulatory Visit (HOSPITAL_COMMUNITY)
Admission: RE | Admit: 2015-06-09 | Discharge: 2015-06-09 | Disposition: A | Discharge status: Home / Self Care | Payer: Medicaid Other | Source: Ambulatory Visit | Attending: Family Medicine | Admitting: Family Medicine

## 2015-06-09 ENCOUNTER — Ambulatory Visit (INDEPENDENT_AMBULATORY_CARE_PROVIDER_SITE_OTHER): Payer: Medicaid Other | Admitting: Family Medicine

## 2015-06-09 ENCOUNTER — Ambulatory Visit (HOSPITAL_COMMUNITY): Payer: Medicaid Other

## 2015-06-09 VITALS — BP 130/83 | HR 91 | Wt 228.0 lb

## 2015-06-09 DIAGNOSIS — Z3A37 37 weeks gestation of pregnancy: Secondary | ICD-10-CM | POA: Insufficient documentation

## 2015-06-09 DIAGNOSIS — O10913 Unspecified pre-existing hypertension complicating pregnancy, third trimester: Secondary | ICD-10-CM | POA: Diagnosis not present

## 2015-06-09 DIAGNOSIS — O09293 Supervision of pregnancy with other poor reproductive or obstetric history, third trimester: Secondary | ICD-10-CM | POA: Diagnosis not present

## 2015-06-09 DIAGNOSIS — O10013 Pre-existing essential hypertension complicating pregnancy, third trimester: Secondary | ICD-10-CM | POA: Diagnosis present

## 2015-06-09 DIAGNOSIS — Z8751 Personal history of pre-term labor: Secondary | ICD-10-CM | POA: Diagnosis not present

## 2015-06-09 DIAGNOSIS — O09292 Supervision of pregnancy with other poor reproductive or obstetric history, second trimester: Secondary | ICD-10-CM | POA: Diagnosis not present

## 2015-06-09 DIAGNOSIS — O10019 Pre-existing essential hypertension complicating pregnancy, unspecified trimester: Secondary | ICD-10-CM | POA: Diagnosis present

## 2015-06-09 DIAGNOSIS — O99213 Obesity complicating pregnancy, third trimester: Secondary | ICD-10-CM | POA: Diagnosis not present

## 2015-06-09 LAB — POCT URINALYSIS DIP (DEVICE)
Glucose, UA: NEGATIVE mg/dL
LEUKOCYTES UA: NEGATIVE
Nitrite: NEGATIVE
PH: 6 (ref 5.0–8.0)
Protein, ur: 100 mg/dL — AB
Specific Gravity, Urine: >=1.03 (ref 1.005–1.030)
UROBILINOGEN UA: 2 mg/dL — AB (ref 0.0–1.0)

## 2015-06-09 NOTE — Progress Notes (Signed)
Subjective:  Kylie Hicks is a 29 y.o. [phone] at [redacted]w[redacted]d being seen today for ongoing prenatal care.  Patient reports no complaints.  Contractions: Not present.  Vag. Bleeding: None. Movement: Present. Denies leaking of fluid.   The following portions of the patient's history were reviewed and updated as appropriate: allergies, current medications, past family history, past medical history, past social history, past surgical history and problem list.   Objective:   Filed Vitals:   06/09/15 1525  BP: 130/83  Pulse: 91  Weight: 228 lb (103.42 kg)    Fetal Status: Fetal Heart Rate (bpm): nst Fundal Height: 36 cm Movement: Present     General:  Alert, oriented and cooperative. Patient is in no acute distress.  Skin: Skin is warm and dry. No rash noted.   Cardiovascular: Normal heart rate noted  Respiratory: Normal respiratory effort, no problems with respiration noted  Abdomen: Soft, gravid, appropriate for gestational age. Pain/Pressure: Present     Vaginal: Vag. Bleeding: None.    Vag D/C Character: Mucous  Cervix: cerclage noted on speculum exam        Extremities: Normal range of motion.  Edema: Trace  Mental Status: Normal mood and affect. Normal behavior. Normal judgment and thought content.   Urinalysis:      Assessment and Plan:  Pregnancy: [id number] at [redacted]w[redacted]d  1. Chronic hypertension in pregnancy, third trimester - Fetal nonstress test - US OB Limited; Future  Cerclage: removed today without complication, removed in it's entirety   term labor symptoms and general obstetric precautions including but not limited to vaginal bleeding, contractions, leaking of fluid and fetal movement were reviewed in detail with the patient.  Please refer to After Visit Summary for other counseling recommendations.   Return in about 7 days (around 06/16/2015) for OB f/u & NST.   Nila Nephew, MD

## 2015-06-14 ENCOUNTER — Encounter: Payer: Self-pay | Admitting: General Practice

## 2015-06-16 ENCOUNTER — Encounter (HOSPITAL_COMMUNITY): Payer: Self-pay

## 2015-06-16 ENCOUNTER — Ambulatory Visit (HOSPITAL_COMMUNITY)
Admission: RE | Admit: 2015-06-16 | Discharge: 2015-06-16 | Disposition: A | Discharge status: Home / Self Care | Payer: Medicaid Other | Source: Ambulatory Visit | Attending: Obstetrics & Gynecology | Admitting: Obstetrics & Gynecology

## 2015-06-16 ENCOUNTER — Ambulatory Visit (INDEPENDENT_AMBULATORY_CARE_PROVIDER_SITE_OTHER): Payer: Medicaid Other | Admitting: Obstetrics & Gynecology

## 2015-06-16 ENCOUNTER — Other Ambulatory Visit: Payer: Self-pay | Admitting: Obstetrics & Gynecology

## 2015-06-16 VITALS — BP 138/85 | HR 85 | Temp 98.7°F | Wt 232.1 lb

## 2015-06-16 DIAGNOSIS — O09293 Supervision of pregnancy with other poor reproductive or obstetric history, third trimester: Secondary | ICD-10-CM | POA: Diagnosis not present

## 2015-06-16 DIAGNOSIS — O99283 Endocrine, nutritional and metabolic diseases complicating pregnancy, third trimester: Secondary | ICD-10-CM | POA: Insufficient documentation

## 2015-06-16 DIAGNOSIS — O365930 Maternal care for other known or suspected poor fetal growth, third trimester, not applicable or unspecified: Secondary | ICD-10-CM | POA: Diagnosis not present

## 2015-06-16 DIAGNOSIS — O0993 Supervision of high risk pregnancy, unspecified, third trimester: Secondary | ICD-10-CM

## 2015-06-16 DIAGNOSIS — O10013 Pre-existing essential hypertension complicating pregnancy, third trimester: Secondary | ICD-10-CM | POA: Diagnosis not present

## 2015-06-16 DIAGNOSIS — E039 Hypothyroidism, unspecified: Secondary | ICD-10-CM | POA: Diagnosis not present

## 2015-06-16 DIAGNOSIS — O10913 Unspecified pre-existing hypertension complicating pregnancy, third trimester: Secondary | ICD-10-CM

## 2015-06-16 DIAGNOSIS — Z3A35 35 weeks gestation of pregnancy: Secondary | ICD-10-CM | POA: Diagnosis not present

## 2015-06-16 NOTE — Patient Instructions (Signed)
Labor Induction  Labor induction is when steps are taken to cause a pregnant woman to begin the labor process. Most women go into labor on their own between 37 weeks and 42 weeks of the pregnancy. When this does not happen or when there is a medical need, methods may be used to induce labor. Labor induction causes a pregnant woman's uterus to contract. It also causes the cervix to soften (ripen), open (dilate), and thin out (efface). Usually, labor is not induced before 39 weeks of the pregnancy unless there is a problem with the baby or mother.  Before inducing labor, your health care provider will consider a number of factors, including the following:  The medical condition of you and the baby.   How many weeks along you are.   The status of the baby's lung maturity.   The condition of the cervix.   The position of the baby.  WHAT ARE THE REASONS FOR LABOR INDUCTION? Labor may be induced for the following reasons:  The health of the baby or mother is at risk.   The pregnancy is overdue by 1 week or more.   The water breaks but labor does not start on its own.   The mother has a health condition or serious illness, such as high blood pressure, infection, placental abruption, or diabetes.  The amniotic fluid amounts are low around the baby.   The baby is distressed.  Convenience or wanting the baby to be born on a certain date is not a reason for inducing labor. WHAT METHODS ARE USED FOR LABOR INDUCTION? Several methods of labor induction may be used, such as:   Prostaglandin medicine. This medicine causes the cervix to dilate and ripen. The medicine will also start contractions. It can be taken by mouth or by inserting a suppository into the vagina.   Inserting a thin tube (catheter) with a balloon on the end into the vagina to dilate the cervix. Once inserted, the balloon is expanded with water, which causes the cervix to open.   Stripping the membranes. Your health  care provider separates amniotic sac tissue from the cervix, causing the cervix to be stretched and causing the release of a hormone called progesterone. This may cause the uterus to contract. It is often done during an office visit. You will be sent home to wait for the contractions to begin. You will then come in for an induction.   Breaking the water. Your health care provider makes a hole in the amniotic sac using a small instrument. Once the amniotic sac breaks, contractions should begin. This may still take hours to see an effect.   Medicine to trigger or strengthen contractions. This medicine is given through an IV access tube inserted into a vein in your arm.  All of the methods of induction, besides stripping the membranes, will be done in the hospital. Induction is done in the hospital so that you and the baby can be carefully monitored.  HOW LONG DOES IT TAKE FOR LABOR TO BE INDUCED? Some inductions can take up to 2-3 days. Depending on the cervix, it usually takes less time. It takes longer when you are induced early in the pregnancy or if this is your first pregnancy. If a mother is still pregnant and the induction has been going on for 2-3 days, either the mother will be sent home or a cesarean delivery will be needed. WHAT ARE THE RISKS ASSOCIATED WITH LABOR INDUCTION? Some of the risks of induction   include:   Changes in fetal heart rate, such as too high, too low, or erratic.   Fetal distress.   Chance of infection for the mother and baby.   Increased chance of having a cesarean delivery.   Breaking off (abruption) of the placenta from the uterus (rare).   Uterine rupture (very rare).  When induction is needed for medical reasons, the benefits of induction may outweigh the risks. WHAT ARE SOME REASONS FOR NOT INDUCING LABOR? Labor induction should not be done if:   It is shown that your baby does not tolerate labor.   You have had previous surgeries on your  uterus, such as a myomectomy or the removal of fibroids.   Your placenta lies very low in the uterus and blocks the opening of the cervix (placenta previa).   Your baby is not in a head-down position.   The umbilical cord drops down into the birth canal in front of the baby. This could cut off the baby's blood and oxygen supply.   You have had a previous cesarean delivery.   There are unusual circumstances, such as the baby being extremely premature.  Document Released: 04/24/2007 Document Revised: 08/05/2013 Document Reviewed: 07/02/2013 [employer] Patient Information 2015 [employer]. This information is not intended to replace advice given to you by your health care provider. Make sure you discuss any questions you have with your health care provider.  

## 2015-06-16 NOTE — Progress Notes (Signed)
IOL scheduled for 06/23/15 0730

## 2015-06-16 NOTE — Progress Notes (Signed)
C/o lower back spasms that also occur in chest simultaneously. Reports 3 every morning

## 2015-06-16 NOTE — Progress Notes (Signed)
BPP 8/8, 24% growth [place] today  Cc: prenatal visit  Subjective:  Kylie Hicks is a 29 y.o. [phone] at [redacted]w[redacted]d being seen today for ongoing prenatal care.  Patient reports no complaints.  Contractions: Not present.  Vag. Bleeding: None. Movement: Present. Denies leaking of fluid.   The following portions of the patient's history were reviewed and updated as appropriate: allergies, current medications, past family history, past medical history, past social history, past surgical history and problem list.   Objective:   Filed Vitals:   06/16/15 1621 06/16/15 1623  BP: 149/94 138/85  Pulse: 85 85  Temp: 98.7 F (37.1 C)   Weight: 232 lb 1.6 oz (105.28 kg)     Fetal Status: Fetal Heart Rate (bpm): 127 Fundal Height: 37 cm Movement: Present     General:  Alert, oriented and cooperative. Patient is in no acute distress.  Skin: Skin is warm and dry. No rash noted.   Cardiovascular: Normal heart rate noted  Respiratory: Normal respiratory effort, no problems with respiration noted  Abdomen: Soft, gravid, appropriate for gestational age. Pain/Pressure: Present     Vaginal: Vag. Bleeding: None.       Cervix: Not evaluated        Extremities: Normal range of motion.  Edema: None  Mental Status: Normal mood and affect. Normal behavior. Normal judgment and thought content.   Urinalysis: Urine Protein: 2+ Urine Glucose: Negative  Assessment and Plan:  Pregnancy: [id number] at [redacted]w[redacted]d  1. Chronic hypertension in pregnancy, third trimester R/o preeclampsia - Protein, urine, 24 hour   Term labor symptoms and general obstetric precautions including but not limited to vaginal bleeding, contractions, leaking of fluid and fetal movement were reviewed in detail with the patient.  Please refer to After Visit Summary for other counseling recommendations.   5 days, 24 hr urine , IOL 39 weeks  Woodroe Mode, MD 06/16/2015

## 2015-06-17 ENCOUNTER — Telehealth (HOSPITAL_COMMUNITY): Payer: Self-pay | Admitting: *Deleted

## 2015-06-17 LAB — POCT URINALYSIS DIP (DEVICE)
Glucose, UA: NEGATIVE mg/dL
NITRITE: NEGATIVE
PROTEIN: 100 mg/dL — AB
Specific Gravity, Urine: >=1.03 (ref 1.005–1.030)
Urobilinogen, UA: 1 mg/dL (ref 0.0–1.0)
pH: 5.5 (ref 5.0–8.0)

## 2015-06-17 NOTE — Telephone Encounter (Signed)
Preadmission screen  

## 2015-06-21 ENCOUNTER — Other Ambulatory Visit: Payer: Medicaid Other

## 2015-06-22 ENCOUNTER — Other Ambulatory Visit: Payer: Self-pay | Admitting: Family Medicine

## 2015-06-22 ENCOUNTER — Telehealth: Payer: Self-pay | Admitting: *Deleted

## 2015-06-22 MED ORDER — HYDROCHLOROTHIAZIDE 25 MG PO TABS: 25.0000 mg | ORAL_TABLET | Freq: Every day | ORAL | Status: DC

## 2015-06-22 NOTE — Telephone Encounter (Signed)
Received a return call from patient left on nurse voicemail today at 1357.  Will attempt to contact.    Spoke with patient via phone.  States she is bottle feeding her baby.  Would like to be placed on HCTZ again for her blood pressure.  States she was taking this before she got pregnant.  I verified her pharmacy and reminded her of her postpartum appt.  Explained to give [place] a couple of hours and then she can pick up her medication at the drug store.  Patient states understanding.

## 2015-06-22 NOTE — Telephone Encounter (Signed)
Patient left voicemail message on the nurse voicemail on 06/22/15 at 1115.  States she delivered her baby in [place].  States while there her blood pressure was elevated and they gave her a pill each day she was there.  States she couldn't get a prescription from them because she has [place] Medicaid.  States she was told to call our office to see if we would put her back on blood pressure medication until her postpartum appointment since her blood pressure was elevated.

## 2015-06-22 NOTE — Telephone Encounter (Signed)
Spoke with Dr. Kennon Rounds.  Needs to know if patient is breastfeeding and what she took while in hospital in [place] or would she like to start back on what she was on previously.  Called patient at number [phone].  No answer.  Left message to return our call.

## 2015-06-23 ENCOUNTER — Other Ambulatory Visit: Payer: Medicaid Other

## 2015-06-23 ENCOUNTER — Inpatient Hospital Stay (HOSPITAL_COMMUNITY): Admission: RE | Admit: 2015-06-23 | Payer: Medicaid Other | Source: Ambulatory Visit

## 2015-07-28 ENCOUNTER — Ambulatory Visit (INDEPENDENT_AMBULATORY_CARE_PROVIDER_SITE_OTHER): Payer: Medicaid Other | Admitting: Family Medicine

## 2015-07-28 ENCOUNTER — Encounter: Payer: Self-pay | Admitting: Family Medicine

## 2015-07-28 VITALS — BP 154/111 | HR 74

## 2015-07-28 DIAGNOSIS — Z30430 Encounter for insertion of intrauterine contraceptive device: Secondary | ICD-10-CM | POA: Diagnosis not present

## 2015-07-28 DIAGNOSIS — Z3202 Encounter for pregnancy test, result negative: Secondary | ICD-10-CM

## 2015-07-28 DIAGNOSIS — R300 Dysuria: Secondary | ICD-10-CM | POA: Diagnosis not present

## 2015-07-28 DIAGNOSIS — K612 Anorectal abscess: Secondary | ICD-10-CM

## 2015-07-28 DIAGNOSIS — I10 Essential (primary) hypertension: Secondary | ICD-10-CM

## 2015-07-28 LAB — POCT URINALYSIS DIP (DEVICE)
BILIRUBIN URINE: NEGATIVE
Glucose, UA: NEGATIVE mg/dL
KETONES UR: NEGATIVE mg/dL
Nitrite: NEGATIVE
PH: 6 (ref 5.0–8.0)
PROTEIN: NEGATIVE mg/dL
Specific Gravity, Urine: <=1.005 (ref 1.005–1.030)
Urobilinogen, UA: 0.2 mg/dL (ref 0.0–1.0)

## 2015-07-28 LAB — POCT PREGNANCY, URINE: Preg Test, Ur: NEGATIVE

## 2015-07-28 MED ORDER — LISINOPRIL-HYDROCHLOROTHIAZIDE 10-12.5 MG PO TABS: 1.0000 | ORAL_TABLET | Freq: Every day | ORAL | Status: DC

## 2015-07-28 MED ORDER — LEVONORGESTREL 20 MCG/24HR IU IUD: 1.0000 | INTRAUTERINE_SYSTEM | Freq: Once | INTRAUTERINE | Status: DC

## 2015-07-28 MED ORDER — OXYCODONE-ACETAMINOPHEN 5-325 MG PO TABS: 1.0000 | ORAL_TABLET | Freq: Four times a day (QID) | ORAL | Status: DC | PRN

## 2015-07-28 MED ORDER — LEVONORGESTREL 20 MCG/24HR IU IUD
INTRAUTERINE_SYSTEM | Freq: Once | INTRAUTERINE | Status: AC
  Administered 2015-07-28: 16:00:00 via INTRAUTERINE

## 2015-07-28 NOTE — Addendum Note (Signed)
Addended by: Christiana Pellant A on: 07/28/2015 04:12 PM   Modules accepted: Orders

## 2015-07-28 NOTE — Progress Notes (Signed)
Subjective:     Kylie Hicks is a 29 y.o. female who presents for a postpartum visit. She is 5 weeks postpartum following a spontaneous vaginal delivery. I have fully reviewed the prenatal and intrapartum course. The delivery was at [redacted]w[redacted]d gestational weeks. Outcome: spontaneous vaginal delivery. Anesthesia: none. Postpartum course has been normal. Baby's course has been normal. Baby is feeding by bottle - Similac Isomil. Bleeding no bleeding. Bowel function is normal. Bladder function is normal. Patient is not sexually active. Contraception method is IUD. Postpartum depression screening: negative. Has headaches, on HCTZ 25 mg. Also notes a boil on her bottom. This has been there x several days. Pain started yesterday, tylenol is not helpful. Has previously had a surgery in this area. Wonders if she has a UTI.  Notes dysuria and lower abdominal pain.  The following portions of the patient's history were reviewed and updated as appropriate: allergies, current medications, past family history, past medical history, past social history, past surgical history and problem list.  Review of Systems Pertinent items are noted in HPI.   Objective:    BP 154/111 mmHg  Pulse 74  LMP 09/22/2014 (Exact Date)  Breastfeeding? Unknown  General:  alert, cooperative and appears stated age  Lungs: normal effort  Heart:  regular rate and rhythm  Abdomen: soft, non-tender; bowel sounds normal; no masses,  no organomegaly   Vulva:  normal  Vagina: normal vagina  Cervix:  multiparous appearance, no cervical motion tenderness and no lesions  Corpus: normal size, contour, position, consistency, mobility, non-tender  Adnexa:  normal ovaries palpated bilaterally  Rectal Exam: Taut fluctuant mass in the anal crease well above rectum and to the right         Urinalysis    Component Value Date/Time   COLORURINE YELLOW 05/23/2015 2109   [place] 05/23/2015 2109   LABSPEC <=1.005 07/28/2015 1505   PHURINE 6.0 07/28/2015 1505   GLUCOSEU NEGATIVE 07/28/2015 1505   HGBUR TRACE* 07/28/2015 1505   BILIRUBINUR NEGATIVE 07/28/2015 1505   KETONESUR NEGATIVE 07/28/2015 1505   PROTEINUR NEGATIVE 07/28/2015 1505   UROBILINOGEN 0.2 07/28/2015 1505   NITRITE NEGATIVE 07/28/2015 1505   LEUKOCYTESUR SMALL* 07/28/2015 1505    Procedure: Patient identified, informed consent performed, signed copy in chart, time out was performed.  Urine pregnancy test negative.  Speculum placed in the vagina.  Cervix visualized.  Cleaned with Betadine x 2.  Grasped anteriourly with a single tooth tenaculum.  Uterus sounded to 8 cm.  Mirena IUD placed per manufacturer's recommendations.  Strings trimmed to 3 cm.   Patient given post procedure instructions and Mirena care card with expiration date.    Procedure 2: After informed consent, area cleaned with alcohol.  1.5 cc of Lidocaine with Epinephrine infiltrated.  Scalpel used to make a small incision and blood-tinged pus drained from site.  Some induration left.  Assessment/Plan:   Problem List Items Addressed This Visit      Unprioritized   Hypertension - Primary    Given how high her BP is, will switch to lisinopril/HCTZ combo--PCP referral.      Relevant Medications   lisinopril-hydrochlorothiazide (PRINZIDE,ZESTORETIC) 10-12.5 MG per tablet   levonorgestrel (MIRENA) 20 MCG/24HR IUD (Completed)   Other Relevant Orders   Ambulatory referral to Internal Medicine    Other Visit Diagnoses    Encounter for IUD insertion        check strings periodically    Relevant Medications    levonorgestrel (MIRENA, 52 MG,) 20 MCG/24HR  IUD    levonorgestrel (MIRENA) 20 MCG/24HR IUD (Completed)    Other Relevant Orders    Pregnancy, urine POC (Completed)    Abscess of anal and rectal regions        Likely pilonidal cyst--sitz baths and pain medicine given.  If no improvement, seek general surgeon    Relevant Medications    oxyCODONE-acetaminophen  (PERCOCET/ROXICET) 5-325 MG per tablet    Dysuria        Relevant Orders    POCT urinalysis dip (device) (Completed)

## 2015-07-28 NOTE — Assessment & Plan Note (Signed)
Given how high her BP is, will switch to lisinopril/HCTZ combo--PCP referral.

## 2015-07-28 NOTE — Progress Notes (Signed)
Referral to primary care per Dr. Kennon Rounds- gave patient contact information for [facility]- she states that is near her home.  Instructed her to call us in any problems getting an appointment.

## 2015-07-28 NOTE — Patient Instructions (Signed)
Abscess An abscess is an infected area that contains a collection of pus and debris.It can occur in almost any part of the body. An abscess is also known as a furuncle or boil. CAUSES  An abscess occurs when tissue gets infected. This can occur from blockage of oil or sweat glands, infection of hair follicles, or a minor injury to the skin. As the body tries to fight the infection, pus collects in the area and creates pressure under the skin. This pressure causes pain. People with weakened immune systems have difficulty fighting infections and get certain abscesses more often.  SYMPTOMS Usually an abscess develops on the skin and becomes a painful mass that is red, warm, and tender. If the abscess forms under the skin, you may feel a moveable soft area under the skin. Some abscesses break open (rupture) on their own, but most will continue to get worse without care. The infection can spread deeper into the body and eventually into the bloodstream, causing you to feel ill.  DIAGNOSIS  Your caregiver will take your medical history and perform a physical exam. A sample of fluid may also be taken from the abscess to determine what is causing your infection. TREATMENT  Your caregiver may prescribe antibiotic medicines to fight the infection. However, taking antibiotics alone usually does not cure an abscess. Your caregiver may need to make a small cut (incision) in the abscess to drain the pus. In some cases, gauze is packed into the abscess to reduce pain and to continue draining the area. HOME CARE INSTRUCTIONS   Only take over-the-counter or prescription medicines for pain, discomfort, or fever as directed by your caregiver.  If you were prescribed antibiotics, take them as directed. Finish them even if you start to feel better.  If gauze is used, follow your caregiver's directions for changing the gauze.  To avoid spreading the infection:  Keep your draining abscess covered with a  bandage.  Wash your hands well.  Do not share personal care items, towels, or whirlpools with others.  Avoid skin contact with others.  Keep your skin and clothes clean around the abscess.  Keep all follow-up appointments as directed by your caregiver. SEEK MEDICAL CARE IF:   You have increased pain, swelling, redness, fluid drainage, or bleeding.  You have muscle aches, chills, or a general ill feeling.  You have a fever. MAKE SURE YOU:   Understand these instructions.  Will watch your condition.  Will get help right away if you are not doing well or get worse. Document Released: 09/12/2005 Document Revised: 06/03/2012 Document Reviewed: 02/15/2012 [facility] Patient Information 2015 [place], [place]. This information is not intended to replace advice given to you by your health care provider. Make sure you discuss any questions you have with your health care provider. Levonorgestrel intrauterine device (IUD) What is this medicine? LEVONORGESTREL IUD (LEE voe nor jes trel) is a contraceptive (birth control) device. The device is placed inside the uterus by a healthcare professional. It is used to prevent pregnancy and can also be used to treat heavy bleeding that occurs during your period. Depending on the device, it can be used for 3 to 5 years. This medicine may be used for other purposes; ask your health care provider or pharmacist if you have questions. COMMON BRAND NAME(S): Verda Cumins What should I tell my health care provider before I take this medicine? They need to know if you have any of these conditions: -abnormal Pap smear -cancer of the  breast, uterus, or cervix -diabetes -endometritis -genital or pelvic infection now or in the past -have more than one sexual partner or your partner has more than one partner -heart disease -history of an ectopic or tubal pregnancy -immune system problems -IUD in place -liver disease or tumor -problems with blood  clots or take blood-thinners -use intravenous drugs -uterus of unusual shape -vaginal bleeding that has not been explained -an unusual or allergic reaction to levonorgestrel, other hormones, silicone, or polyethylene, medicines, foods, dyes, or preservatives -pregnant or trying to get pregnant -breast-feeding How should I use this medicine? This device is placed inside the uterus by a health care professional. Talk to your pediatrician regarding the use of this medicine in children. Special care may be needed. Overdosage: If you think you have taken too much of this medicine contact a poison control center or emergency room at once. NOTE: This medicine is only for you. Do not share this medicine with others. What if I miss a dose? This does not apply. What may interact with this medicine? Do not take this medicine with any of the following medications: -amprenavir -bosentan -fosamprenavir This medicine may also interact with the following medications: -aprepitant -barbiturate medicines for inducing sleep or treating seizures -bexarotene -griseofulvin -medicines to treat seizures like carbamazepine, ethotoin, felbamate, oxcarbazepine, phenytoin, topiramate -modafinil -pioglitazone -rifabutin -rifampin -rifapentine -some medicines to treat HIV infection like atazanavir, indinavir, lopinavir, nelfinavir, tipranavir, ritonavir -St. John's wort -warfarin This list may not describe all possible interactions. Give your health care provider a list of all the medicines, herbs, non-prescription drugs, or dietary supplements you use. Also tell them if you smoke, drink alcohol, or use illegal drugs. Some items may interact with your medicine. What should I watch for while using this medicine? Visit your doctor or health care professional for regular check ups. See your doctor if you or your partner has sexual contact with others, becomes HIV positive, or gets a sexual transmitted  disease. This product does not protect you against HIV infection (AIDS) or other sexually transmitted diseases. You can check the placement of the IUD yourself by reaching up to the top of your vagina with clean fingers to feel the threads. Do not pull on the threads. It is a good habit to check placement after each menstrual period. Call your doctor right away if you feel more of the IUD than just the threads or if you cannot feel the threads at all. The IUD may come out by itself. You may become pregnant if the device comes out. If you notice that the IUD has come out use a backup birth control method like condoms and call your health care provider. Using tampons will not change the position of the IUD and are okay to use during your period. What side effects may I notice from receiving this medicine? Side effects that you should report to your doctor or health care professional as soon as possible: -allergic reactions like skin rash, itching or hives, swelling of the face, lips, or tongue -fever, flu-like symptoms -genital sores -high blood pressure -no menstrual period for 6 weeks during use -pain, swelling, warmth in the leg -pelvic pain or tenderness -severe or sudden headache -signs of pregnancy -stomach cramping -sudden shortness of breath -trouble with balance, talking, or walking -unusual vaginal bleeding, discharge -yellowing of the eyes or skin Side effects that usually do not require medical attention (report to your doctor or health care professional if they continue or are bothersome): -acne -breast pain -  change in sex drive or performance -changes in weight -cramping, dizziness, or faintness while the device is being inserted -headache -irregular menstrual bleeding within first 3 to 6 months of use -nausea This list may not describe all possible side effects. Call your doctor for medical advice about side effects. You may report side effects to FDA at  1-800-FDA-1088. Where should I keep my medicine? This does not apply. NOTE: This sheet is a summary. It may not cover all possible information. If you have questions about this medicine, talk to your doctor, pharmacist, or health care provider.  2015, Elsevier/[employer] Standard. (2012-01-03 13:54:04) DASH Eating Plan DASH stands for "Dietary Approaches to Stop Hypertension." The DASH eating plan is a healthy eating plan that has been shown to reduce high blood pressure (hypertension). Additional health benefits may include reducing the risk of type 2 diabetes mellitus, heart disease, and stroke. The DASH eating plan may also help with weight loss. WHAT DO I NEED TO KNOW ABOUT THE DASH EATING PLAN? For the DASH eating plan, you will follow these general guidelines:  Choose foods with a percent daily value for sodium of less than 5% (as listed on the food label).  Use salt-free seasonings or herbs instead of table salt or sea salt.  Check with your health care provider or pharmacist before using salt substitutes.  Eat lower-sodium products, often labeled as "lower sodium" or "no salt added."  Eat fresh foods.  Eat more vegetables, fruits, and low-fat dairy products.  Choose whole grains. Look for the word "whole" as the first word in the ingredient list.  Choose fish and skinless chicken or [place] more often than red meat. Limit fish, poultry, and meat to 6 oz (170 g) each day.  Limit sweets, desserts, sugars, and sugary drinks.  Choose heart-healthy fats.  Limit cheese to 1 oz (28 g) per day.  Eat more home-cooked food and less restaurant, buffet, and fast food.  Limit fried foods.  Cook foods using methods other than frying.  Limit canned vegetables. If you do use them, rinse them well to decrease the sodium.  When eating at a restaurant, ask that your food be prepared with less salt, or no salt if possible. WHAT FOODS CAN I EAT? Seek help from a dietitian for individual  calorie needs. Grains Whole grain or whole wheat bread. Brown rice. Whole grain or whole wheat pasta. Quinoa, bulgur, and whole grain cereals. Low-sodium cereals. Corn or whole wheat flour tortillas. Whole grain cornbread. Whole grain crackers. Low-sodium crackers. Vegetables Fresh or frozen vegetables (raw, steamed, roasted, or grilled). Low-sodium or reduced-sodium tomato and vegetable juices. Low-sodium or reduced-sodium tomato sauce and paste. Low-sodium or reduced-sodium canned vegetables.  Fruits All fresh, canned (in natural juice), or frozen fruits. Meat and Other Protein Products Ground beef (85% or leaner), grass-fed beef, or beef trimmed of fat. Skinless chicken or [place]. Ground chicken or [place]. Pork trimmed of fat. All fish and seafood. Eggs. Dried beans, peas, or lentils. Unsalted nuts and seeds. Unsalted canned beans. Dairy Low-fat dairy products, such as skim or 1% milk, 2% or reduced-fat cheeses, low-fat ricotta or cottage cheese, or plain low-fat yogurt. Low-sodium or reduced-sodium cheeses. Fats and Oils Tub margarines without trans fats. Light or reduced-fat mayonnaise and salad dressings (reduced sodium). Avocado. Safflower, olive, or canola oils. Natural peanut or almond butter. Other Unsalted popcorn and pretzels. The items listed above may not be a complete list of recommended foods or beverages. Contact your dietitian for more options. WHAT  FOODS ARE NOT RECOMMENDED? Grains White bread. White pasta. White rice. Refined cornbread. Bagels and croissants. Crackers that contain trans fat. Vegetables Creamed or fried vegetables. Vegetables in a cheese sauce. Regular canned vegetables. Regular canned tomato sauce and paste. Regular tomato and vegetable juices. Fruits Dried fruits. Canned fruit in light or heavy syrup. Fruit juice. Meat and Other Protein Products Fatty cuts of meat. Ribs, chicken wings, bacon, sausage, bologna, salami, chitterlings, fatback, hot dogs,  bratwurst, and packaged luncheon meats. Salted nuts and seeds. Canned beans with salt. Dairy Whole or 2% milk, cream, half-and-half, and cream cheese. Whole-fat or sweetened yogurt. Full-fat cheeses or blue cheese. Nondairy creamers and whipped toppings. Processed cheese, cheese spreads, or cheese curds. Condiments Onion and garlic salt, seasoned salt, table salt, and sea salt. Canned and packaged gravies. Worcestershire sauce. Tartar sauce. Barbecue sauce. Teriyaki sauce. Soy sauce, including reduced sodium. Steak sauce. Fish sauce. Oyster sauce. Cocktail sauce. Horseradish. Ketchup and mustard. Meat flavorings and tenderizers. Bouillon cubes. Hot sauce. Tabasco sauce. Marinades. Taco seasonings. Relishes. Fats and Oils Butter, stick margarine, lard, shortening, ghee, and bacon fat. Coconut, palm kernel, or palm oils. Regular salad dressings. Other Pickles and olives. Salted popcorn and pretzels. The items listed above may not be a complete list of foods and beverages to avoid. Contact your dietitian for more information. WHERE CAN I FIND MORE INFORMATION? National Heart, Lung, and Blood Institute: travelstabloid.com Document Released: 11/22/2011 Document Revised: 04/19/2014 Document Reviewed: 10/07/2013 [facility] Patient Information 2015 [place], [place]. This information is not intended to replace advice given to you by your health care provider. Make sure you discuss any questions you have with your health care provider.

## 2015-07-29 LAB — URINE CULTURE
COLONY COUNT: NO GROWTH
ORGANISM ID, BACTERIA: NO GROWTH

## 2015-08-03 ENCOUNTER — Encounter: Payer: Self-pay | Admitting: *Deleted

## 2015-08-23 NOTE — Addendum Note (Signed)
Addended byLauralee Evener. on: 08/23/2015 10:30 AM   Modules accepted: Orders

## 2015-08-25 ENCOUNTER — Ambulatory Visit: Payer: Medicaid Other | Admitting: Family

## 2015-09-24 ENCOUNTER — Encounter (HOSPITAL_COMMUNITY): Payer: Self-pay

## 2015-09-24 ENCOUNTER — Emergency Department (HOSPITAL_COMMUNITY)
Admission: EM | Admit: 2015-09-24 | Discharge: 2015-09-25 | Discharge status: Against Medical Advice | Payer: Medicaid Other | Attending: Emergency Medicine | Admitting: Emergency Medicine

## 2015-09-24 DIAGNOSIS — Z720 Tobacco use: Secondary | ICD-10-CM | POA: Diagnosis not present

## 2015-09-24 DIAGNOSIS — I10 Essential (primary) hypertension: Secondary | ICD-10-CM | POA: Insufficient documentation

## 2015-09-24 DIAGNOSIS — R220 Localized swelling, mass and lump, head: Secondary | ICD-10-CM | POA: Insufficient documentation

## 2015-09-24 NOTE — ED Notes (Signed)
Pt reports she woke up today and noticed her right cheek was swollen, reports left sided upper dental pain.

## 2015-09-24 NOTE — ED Notes (Signed)
Called to go back to room with no answer.

## 2015-09-25 NOTE — ED Notes (Signed)
Called to go back to a room but no answer

## 2015-11-03 ENCOUNTER — Ambulatory Visit: Payer: Medicaid Other | Admitting: Advanced Practice Midwife

## 2015-12-13 ENCOUNTER — Encounter (HOSPITAL_COMMUNITY): Payer: Self-pay | Admitting: *Deleted

## 2015-12-13 ENCOUNTER — Emergency Department (HOSPITAL_COMMUNITY)
Admission: EM | Admit: 2015-12-13 | Discharge: 2015-12-13 | Disposition: A | Discharge status: Home / Self Care | Payer: Medicaid Other | Attending: Emergency Medicine | Admitting: Emergency Medicine

## 2015-12-13 ENCOUNTER — Other Ambulatory Visit: Payer: Self-pay

## 2015-12-13 DIAGNOSIS — Z8742 Personal history of other diseases of the female genital tract: Secondary | ICD-10-CM | POA: Insufficient documentation

## 2015-12-13 DIAGNOSIS — F17210 Nicotine dependence, cigarettes, uncomplicated: Secondary | ICD-10-CM | POA: Insufficient documentation

## 2015-12-13 DIAGNOSIS — L02412 Cutaneous abscess of left axilla: Secondary | ICD-10-CM

## 2015-12-13 DIAGNOSIS — Z862 Personal history of diseases of the blood and blood-forming organs and certain disorders involving the immune mechanism: Secondary | ICD-10-CM | POA: Insufficient documentation

## 2015-12-13 DIAGNOSIS — R002 Palpitations: Secondary | ICD-10-CM | POA: Insufficient documentation

## 2015-12-13 DIAGNOSIS — Z8639 Personal history of other endocrine, nutritional and metabolic disease: Secondary | ICD-10-CM | POA: Insufficient documentation

## 2015-12-13 DIAGNOSIS — Z793 Long term (current) use of hormonal contraceptives: Secondary | ICD-10-CM | POA: Insufficient documentation

## 2015-12-13 DIAGNOSIS — Z8659 Personal history of other mental and behavioral disorders: Secondary | ICD-10-CM | POA: Insufficient documentation

## 2015-12-13 DIAGNOSIS — Z87898 Personal history of other specified conditions: Secondary | ICD-10-CM

## 2015-12-13 DIAGNOSIS — Z79899 Other long term (current) drug therapy: Secondary | ICD-10-CM | POA: Insufficient documentation

## 2015-12-13 DIAGNOSIS — Z8619 Personal history of other infectious and parasitic diseases: Secondary | ICD-10-CM | POA: Insufficient documentation

## 2015-12-13 DIAGNOSIS — K612 Anorectal abscess: Secondary | ICD-10-CM

## 2015-12-13 DIAGNOSIS — I10 Essential (primary) hypertension: Secondary | ICD-10-CM | POA: Insufficient documentation

## 2015-12-13 DIAGNOSIS — Z8709 Personal history of other diseases of the respiratory system: Secondary | ICD-10-CM | POA: Insufficient documentation

## 2015-12-13 MED ORDER — SULFAMETHOXAZOLE-TRIMETHOPRIM 800-160 MG PO TABS: 1.0000 | ORAL_TABLET | Freq: Two times a day (BID) | ORAL | Status: AC

## 2015-12-13 MED ORDER — OXYCODONE-ACETAMINOPHEN 5-325 MG PO TABS: 1.0000 | ORAL_TABLET | Freq: Four times a day (QID) | ORAL | Status: DC | PRN

## 2015-12-13 MED ORDER — CEPHALEXIN 500 MG PO CAPS
1000.0000 mg | ORAL_CAPSULE | Freq: Two times a day (BID) | ORAL | Status: DC
Start: 2015-12-13 — End: 2016-04-09

## 2015-12-13 MED ORDER — LIDOCAINE-EPINEPHRINE (PF) 2 %-1:200000 IJ SOLN
20.0000 mL | Freq: Once | INTRAMUSCULAR | Status: AC
  Administered 2015-12-13: 20 mL via INTRADERMAL
  Filled 2015-12-13: qty 20

## 2015-12-13 NOTE — Discharge Instructions (Signed)
Apply warm compress several times daily to help with drainage.  Take antibiotics as prescribed for further treatment.  Follow up with your doctor for evaluation of your heart palpitation.  Return in 48hrs for a wound recheck and packing removal.   Abscess An abscess is an infected area that contains a collection of pus and debris.It can occur in almost any part of the body. An abscess is also known as a furuncle or boil. CAUSES  An abscess occurs when tissue gets infected. This can occur from blockage of oil or sweat glands, infection of hair follicles, or a minor injury to the skin. As the body tries to fight the infection, pus collects in the area and creates pressure under the skin. This pressure causes pain. People with weakened immune systems have difficulty fighting infections and get certain abscesses more often.  SYMPTOMS Usually an abscess develops on the skin and becomes a painful mass that is red, warm, and tender. If the abscess forms under the skin, you may feel a moveable soft area under the skin. Some abscesses break open (rupture) on their own, but most will continue to get worse without care. The infection can spread deeper into the body and eventually into the bloodstream, causing you to feel ill.  DIAGNOSIS  Your caregiver will take your medical history and perform a physical exam. A sample of fluid may also be taken from the abscess to determine what is causing your infection. TREATMENT  Your caregiver may prescribe antibiotic medicines to fight the infection. However, taking antibiotics alone usually does not cure an abscess. Your caregiver may need to make a small cut (incision) in the abscess to drain the pus. In some cases, gauze is packed into the abscess to reduce pain and to continue draining the area. HOME CARE INSTRUCTIONS   Only take over-the-counter or prescription medicines for pain, discomfort, or fever as directed by your caregiver.  If you were prescribed  antibiotics, take them as directed. Finish them even if you start to feel better.  If gauze is used, follow your caregiver's directions for changing the gauze.  To avoid spreading the infection:  Keep your draining abscess covered with a bandage.  Wash your hands well.  Do not share personal care items, towels, or whirlpools with others.  Avoid skin contact with others.  Keep your skin and clothes clean around the abscess.  Keep all follow-up appointments as directed by your caregiver. SEEK MEDICAL CARE IF:   You have increased pain, swelling, redness, fluid drainage, or bleeding.  You have muscle aches, chills, or a general ill feeling.  You have a fever. MAKE SURE YOU:   Understand these instructions.  Will watch your condition.  Will get help right away if you are not doing well or get worse.   This information is not intended to replace advice given to you by your health care provider. Make sure you discuss any questions you have with your health care provider.   Document Released: 09/12/2005 Document Revised: 06/03/2012 Document Reviewed: 02/15/2012 Elsevier Interactive Patient Education 2016 [employer]  Incision and Drainage Incision and drainage is a procedure in which a sac-like structure (cystic structure) is opened and drained. The area to be drained usually contains material such as pus, fluid, or blood.  LET YOUR CAREGIVER KNOW ABOUT:   Allergies to medicine.  Medicines taken, including vitamins, herbs, eyedrops, over-the-counter medicines, and creams.  Use of steroids (by mouth or creams).  Previous problems with anesthetics or numbing  medicines.  History of bleeding problems or blood clots.  Previous surgery.  Other health problems, including diabetes and kidney problems.  Possibility of pregnancy, if this applies. RISKS AND COMPLICATIONS  Pain.  Bleeding.  Scarring.  Infection. BEFORE THE PROCEDURE  You may need to have an  ultrasound or other imaging tests to see how large or deep your cystic structure is. Blood tests may also be used to determine if you have an infection or how severe the infection is. You may need to have a tetanus shot. PROCEDURE  The affected area is cleaned with a cleaning fluid. The cyst area will then be numbed with a medicine (local anesthetic). A small incision will be made in the cystic structure. A syringe or catheter may be used to drain the contents of the cystic structure, or the contents may be squeezed out. The area will then be flushed with a cleansing solution. After cleansing the area, it is often gently packed with a gauze or another wound dressing. Once it is packed, it will be covered with gauze and tape or some other type of wound dressing. AFTER THE PROCEDURE   Often, you will be allowed to go home right after the procedure.  You may be given antibiotic medicine to prevent or heal an infection.  If the area was packed with gauze or some other wound dressing, you will likely need to come back in 1 to 2 days to get it removed.  The area should heal in about 14 days.   This information is not intended to replace advice given to you by your health care provider. Make sure you discuss any questions you have with your health care provider.   Document Released: 05/29/2001 Document Revised: 06/03/2012 Document Reviewed: 01/28/2012 Elsevier Interactive Patient Education [employer].

## 2015-12-13 NOTE — ED Provider Notes (Signed)
CSN: [id number]     Arrival date & time 12/13/15  1025 History   First MD Initiated Contact with Patient 12/13/15 1419     Chief Complaint  Patient presents with  . Palpitations  . Cyst     (Consider location/radiation/quality/duration/timing/severity/associated sxs/prior Treatment) HPI   29 year old female with history of thyroid disease, hypertension, anemia who presents with multiple complaints. Patient has noticed increasing pain and swelling to her left armpit ongoing for the past 3 days. Pain is sharp and achy, worsening with moving her arm or with palpation. She has been using warm compress and taken over-the-counter medication without adequate relief. No associated fever or chills chest pain or shortness of breath. Furthermore patient also reported having intermittent heart palpitation ongoing for more than a year after she had a baby. She is curious about the heart palpitation and would like to have it evaluated. She does not complain of any heart palpitation at this time. She denies drinking coffee heavily her taking any medication that triggers her heart palpitation. Sometimes changing positions such as standing up may triggers heart palpitation.  Past Medical History  Diagnosis Date  . Anemia     with pregnancies  . Incompetent cervix     cerclage  . History of preterm labor, current pregnancy   . Chlamydia   . Hypothyroidism     No meds  . Postpartum depression 2006  . Hypertension     no meds  . Influenza B positve PCR on 03/31/15 04/04/2015   Past Surgical History  Procedure Laterality Date  . Cervical cerclage    . Pilonidal cyst / sinus excision    . Intrauterine device insertion    . Cervical cerclage N/A 02/18/2015    Procedure: CERCLAGE CERVICAL;  Surgeon: Woodroe Mode, MD;  Location: [facility];  Service: Gynecology;  Laterality: N/A;   Family History  Problem Relation Age of Onset  . Hypertension Mother   . Cancer Sister    Social History  Substance Use  Topics  . Smoking status: Current Every Day Smoker -- 0.25 packs/day for 3 years    Types: Cigarettes  . Smokeless tobacco: Never Used  . Alcohol Use: No   OB History    Gravida Para Term Preterm AB TAB SAB Ectopic Multiple Living   8 7 6 1  0 0 0 0 0 7     Review of Systems  All other systems reviewed and are negative.     Allergies  Review of patient's allergies indicates no known allergies.  Home Medications   Prior to Admission medications   Medication Sig Start Date End Date Taking? Authorizing Provider  acetaminophen (TYLENOL) 325 MG tablet Take 650 mg by mouth every 6 (six) hours as needed for mild pain, moderate pain or headache.     Historical Provider, MD  cyclobenzaprine (FLEXERIL) 10 MG tablet Take 1 tablet (10 mg total) by mouth at bedtime. 03/03/15   Gwen Pounds, CNM  famotidine (PEPCID) 40 MG tablet Take 1 tablet (40 mg total) by mouth daily. 02/17/15   Truett Mainland, DO  hydrochlorothiazide (HYDRODIURIL) 25 MG tablet Take 1 tablet (25 mg total) by mouth daily. 06/22/15   Donnamae Jude, MD  levonorgestrel (MIRENA, 52 MG,) 20 MCG/24HR IUD 1 Intra Uterine Device (1 each total) by Intrauterine route once. Will place in clinic 07/28/15   Donnamae Jude, MD  lisinopril-hydrochlorothiazide (PRINZIDE,ZESTORETIC) 10-12.5 MG per tablet Take 1 tablet by mouth daily. 07/28/15   Standley Dakins  Kennon Rounds, MD  oxyCODONE-acetaminophen (PERCOCET/ROXICET) 5-325 MG per tablet Take 1-2 tablets by mouth every 6 (six) hours as needed. 07/28/15   Donnamae Jude, MD  Prenatal Multivit-Min-Fe-FA (PRENATAL VITAMINS) 0.8 MG tablet Take 1 tablet by mouth daily. 12/30/14   Woodroe Mode, MD   BP 151/109 mmHg  Pulse 78  Temp(Src) 98.7 F (37.1 C) (Oral)  Resp 18  Ht 5\' 3"  (1.6 m)  Wt 92.08 kg  BMI 35.97 kg/m2  SpO2 100% Physical Exam  Constitutional: She appears well-developed and well-nourished. No distress.  HENT:  Head: Atraumatic.  Eyes: Conjunctivae are normal.  Neck: Neck supple.   Cardiovascular: Normal rate and regular rhythm.  Exam reveals no gallop and no friction rub.   No murmur heard. Pulmonary/Chest: Effort normal.  Abdominal: Soft. There is no tenderness.  Musculoskeletal: She exhibits tenderness (Left axillary region: An area of induration and fluctuance noted to left axillary fold consistence with an abscess. Tenderness to palpation. No surrounding skin erythema.).  Neurological: She is alert.  Skin: No rash noted.  Psychiatric: She has a normal mood and affect.  Nursing note and vitals reviewed.   ED Course  Procedures (including critical care time)  INCISION AND DRAINAGE Performed by: Domenic Moras Consent: Verbal consent obtained. Risks and benefits: risks, benefits and alternatives were discussed Type: abscess  Body area: L axillary region  Anesthesia: local infiltration  Incision was made with a scalpel.  Local anesthetic: lidocaine 2% w epinephrine  Anesthetic total: 5 ml  Complexity: complex Blunt dissection to break up loculations  Drainage: purulent  Drainage amount: moderate  Packing material: 1/4 in iodoform gauze  Patient tolerance: Patient tolerated the procedure well with no immediate complications.     Labs Review Labs Reviewed - No data to display  Imaging Review No results found. I have personally reviewed and evaluated these images and lab results as part of my medical decision-making.   EKG Interpretation   Date/Time:  Tuesday December 13 2015 14:25:21 EST Ventricular Rate:  71 PR Interval:  182 QRS Duration: 88 QT Interval:  401 QTC Calculation: 436 R Axis:   9 Text Interpretation:  Sinus rhythm Probable left ventricular hypertrophy  Anterior Q waves, possibly due to LVH No old tracing to compare Confirmed  by KNAPP  MD-J, JON [id number]) on 12/13/2015 3:04:31 PM      MDM   Final diagnoses:  Cutaneous abscess of left axilla  History of palpitations    BP 172/99 mmHg  Pulse 60  Temp(Src) 98.7  F (37.1 C) (Oral)  Resp 22  Ht 5\' 3"  (1.6 m)  Wt 92.08 kg  BMI 35.97 kg/m2  SpO2 100%   2:28 PM Patient presents with pain to her left axillary fold consistence with a axillary abscess. She did have one similar abscess at age 51. Plan to perform incising drainage. She is up-to-date with her tetanus status.  She also mentioned having intermittent heart palpitation, no active palpitation at this time. EKG ordered.  3:15 PM Successful incision and drainage of left axillary abscess with significant amount of pustular discharge. Wound was packed. Wound care discussed including warm compress, taking antibiotic, and return in 48 hours for abscess recheck. She will benefit from antibiotic and pain medication. Her EKG shows sinus rhythm without any concerning arrhythmia. I recommend patient to follow-up with PCP for further care. Patient is hypertensive today likely secondary to pain. She will also need to have a blood pressure recheck in the next week.  Domenic Moras, PA-C 12/13/15  [place], MD 12/14/15 [phone]

## 2015-12-13 NOTE — ED Notes (Signed)
States she has had palpitations for " a while " however got worse yest. C/o cyst under left arm

## 2016-04-09 ENCOUNTER — Inpatient Hospital Stay (HOSPITAL_COMMUNITY)
Admission: AD | Admit: 2016-04-09 | Discharge: 2016-04-09 | Disposition: A | Discharge status: Home / Self Care | Payer: Medicaid Other | Source: Ambulatory Visit | Attending: Family Medicine | Admitting: Family Medicine

## 2016-04-09 ENCOUNTER — Inpatient Hospital Stay (HOSPITAL_COMMUNITY): Payer: Medicaid Other

## 2016-04-09 ENCOUNTER — Encounter (HOSPITAL_COMMUNITY): Payer: Self-pay | Admitting: *Deleted

## 2016-04-09 DIAGNOSIS — R109 Unspecified abdominal pain: Secondary | ICD-10-CM | POA: Diagnosis present

## 2016-04-09 DIAGNOSIS — I10 Essential (primary) hypertension: Secondary | ICD-10-CM

## 2016-04-09 DIAGNOSIS — O26891 Other specified pregnancy related conditions, first trimester: Secondary | ICD-10-CM | POA: Insufficient documentation

## 2016-04-09 DIAGNOSIS — Z3A01 Less than 8 weeks gestation of pregnancy: Secondary | ICD-10-CM | POA: Diagnosis not present

## 2016-04-09 DIAGNOSIS — E039 Hypothyroidism, unspecified: Secondary | ICD-10-CM | POA: Diagnosis not present

## 2016-04-09 DIAGNOSIS — O2631 Retained intrauterine contraceptive device in pregnancy, first trimester: Secondary | ICD-10-CM

## 2016-04-09 DIAGNOSIS — O99331 Smoking (tobacco) complicating pregnancy, first trimester: Secondary | ICD-10-CM | POA: Insufficient documentation

## 2016-04-09 DIAGNOSIS — R350 Frequency of micturition: Secondary | ICD-10-CM | POA: Diagnosis not present

## 2016-04-09 DIAGNOSIS — I350 Nonrheumatic aortic (valve) stenosis: Secondary | ICD-10-CM | POA: Diagnosis not present

## 2016-04-09 DIAGNOSIS — O09291 Supervision of pregnancy with other poor reproductive or obstetric history, first trimester: Secondary | ICD-10-CM

## 2016-04-09 DIAGNOSIS — Z79899 Other long term (current) drug therapy: Secondary | ICD-10-CM | POA: Diagnosis not present

## 2016-04-09 DIAGNOSIS — O10911 Unspecified pre-existing hypertension complicating pregnancy, first trimester: Secondary | ICD-10-CM | POA: Diagnosis not present

## 2016-04-09 DIAGNOSIS — Z3A08 8 weeks gestation of pregnancy: Secondary | ICD-10-CM

## 2016-04-09 DIAGNOSIS — F17210 Nicotine dependence, cigarettes, uncomplicated: Secondary | ICD-10-CM | POA: Insufficient documentation

## 2016-04-09 DIAGNOSIS — O9989 Other specified diseases and conditions complicating pregnancy, childbirth and the puerperium: Secondary | ICD-10-CM

## 2016-04-09 DIAGNOSIS — O26899 Other specified pregnancy related conditions, unspecified trimester: Secondary | ICD-10-CM

## 2016-04-09 LAB — URINALYSIS, ROUTINE W REFLEX MICROSCOPIC
BILIRUBIN URINE: NEGATIVE
GLUCOSE, UA: NEGATIVE mg/dL
KETONES UR: NEGATIVE mg/dL
Leukocytes, UA: NEGATIVE
Nitrite: NEGATIVE
PH: 5.5 (ref 5.0–8.0)
Protein, ur: NEGATIVE mg/dL
Specific Gravity, Urine: 1.02 (ref 1.005–1.030)

## 2016-04-09 LAB — CBC
HEMATOCRIT: 31.5 % — AB (ref 36.0–46.0)
Hemoglobin: 10.5 g/dL — ABNORMAL LOW (ref 12.0–15.0)
MCH: 25.6 pg — AB (ref 26.0–34.0)
MCHC: 33.3 g/dL (ref 30.0–36.0)
MCV: 76.8 fL — AB (ref 78.0–100.0)
PLATELETS: 301 10*3/uL (ref 150–400)
RBC: 4.1 MIL/uL (ref 3.87–5.11)
RDW: 16.3 % — AB (ref 11.5–15.5)
WBC: 4.9 10*3/uL (ref 4.0–10.5)

## 2016-04-09 LAB — URINE MICROSCOPIC-ADD ON

## 2016-04-09 LAB — HCG, QUANTITATIVE, PREGNANCY: hCG, Beta Chain, Quant, S: 66019 m[IU]/mL — ABNORMAL HIGH (ref <5)

## 2016-04-09 LAB — ABO/RH: ABO/RH(D): O POS

## 2016-04-09 LAB — POCT PREGNANCY, URINE: Preg Test, Ur: POSITIVE — AB

## 2016-04-09 NOTE — MAU Provider Note (Signed)
History     CSN: [id number]  Arrival date and time: 04/09/16 1207   First Provider Initiated Contact with Patient 04/09/16 1327      Chief Complaint  Patient presents with  . Possible Pregnancy  . Abdominal Pain  . Urinary Frequency   HPI   Kylie Hicks is a 30 y.o. female IUD in place;  [id number] @ [redacted]w[redacted]d presenting to MAU with abdominal pain; cramping that comes and goes. It is mainly all over her abdomen. Her history is complicated with chronic hypertension, aortic stenosis with last EF 50-55%, incompetent cervix requiring 17p and cerclage with last 2 pregnancies.   She is currently taking lisinopril-HCTZ for her chronic hypertension.   She has urinary frequency and had a positive pregnancy test this morning.   OB History    Gravida Para Term Preterm AB TAB SAB Ectopic Multiple Living   9 8 7 1  0 0 0 0 0 7      Past Medical History  Diagnosis Date  . Anemia     with pregnancies  . Incompetent cervix     cerclage  . History of preterm labor, current pregnancy   . Chlamydia   . Hypothyroidism     No meds  . Postpartum depression 2006  . Influenza B positve PCR on 03/31/15 04/04/2015  . Hypertension     Past Surgical History  Procedure Laterality Date  . Cervical cerclage    . Pilonidal cyst / sinus excision    . Intrauterine device insertion    . Cervical cerclage N/A 02/18/2015    Procedure: CERCLAGE CERVICAL;  Surgeon: Woodroe Mode, MD;  Location: [facility];  Service: Gynecology;  Laterality: N/A;    Family History  Problem Relation Age of Onset  . Hypertension Mother   . Cancer Sister     Social History  Substance Use Topics  . Smoking status: Current Every Day Smoker -- 0.25 packs/day for 3 years    Types: Cigarettes  . Smokeless tobacco: Never Used  . Alcohol Use: No    Allergies: No Known Allergies  Prescriptions prior to admission  Medication Sig Dispense Refill Last Dose  . acetaminophen (TYLENOL) 325 MG tablet Take 650 mg by mouth every 6  (six) hours as needed for mild pain, moderate pain or headache.    Past Month at Unknown time  . cephALEXin (KEFLEX) 500 MG capsule Take 2 capsules (1,000 mg total) by mouth 2 (two) times daily. 40 capsule 0   . cyclobenzaprine (FLEXERIL) 10 MG tablet Take 1 tablet (10 mg total) by mouth at bedtime. 30 tablet 2 12/12/2015 at Unknown time  . famotidine (PEPCID) 40 MG tablet Take 1 tablet (40 mg total) by mouth daily. 30 tablet 3 12/12/2015 at Unknown time  . hydrochlorothiazide (HYDRODIURIL) 25 MG tablet Take 1 tablet (25 mg total) by mouth daily. 30 tablet 3 12/13/2015 at Unknown time  . levonorgestrel (MIRENA, 52 MG,) 20 MCG/24HR IUD 1 Intra Uterine Device (1 each total) by Intrauterine route once. Will place in clinic   Past Month at Unknown time  . lisinopril-hydrochlorothiazide (PRINZIDE,ZESTORETIC) 10-12.5 MG per tablet Take 1 tablet by mouth daily. 30 tablet 2 12/12/2015 at Unknown time  . oxyCODONE-acetaminophen (PERCOCET/ROXICET) 5-325 MG tablet Take 1 tablet by mouth every 6 (six) hours as needed for moderate pain. 15 tablet 0   . Prenatal Multivit-Min-Fe-FA (PRENATAL VITAMINS) 0.8 MG tablet Take 1 tablet by mouth daily. (Patient not taking: Reported on 12/13/2015) 30 tablet 12 Not Taking  at Unknown time   Results for orders placed or performed during the hospital encounter of 04/09/16 (from the past 48 hour(s))  Urinalysis, Routine w reflex microscopic (not at [facility])     Status: Abnormal   Collection Time: 04/09/16 12:25 PM  Result Value Ref Range   Color, Urine YELLOW YELLOW   APPearance CLEAR CLEAR   Specific Gravity, Urine 1.020 1.005 - 1.030   pH 5.5 5.0 - 8.0   Glucose, UA NEGATIVE NEGATIVE mg/dL   Hgb urine dipstick SMALL (A) NEGATIVE   Bilirubin Urine NEGATIVE NEGATIVE   Ketones, ur NEGATIVE NEGATIVE mg/dL   Protein, ur NEGATIVE NEGATIVE mg/dL   Nitrite NEGATIVE NEGATIVE   Leukocytes, UA NEGATIVE NEGATIVE  Urine microscopic-add on     Status: Abnormal   Collection Time:  04/09/16 12:25 PM  Result Value Ref Range   Squamous Epithelial / LPF 0-5 (A) NONE SEEN   WBC, UA 0-5 0 - 5 WBC/hpf   RBC / HPF 0-5 0 - 5 RBC/hpf   Bacteria, UA FEW (A) NONE SEEN   Urine-Other MUCOUS PRESENT   Pregnancy, urine POC     Status: Abnormal   Collection Time: 04/09/16 12:43 PM  Result Value Ref Range   Preg Test, Ur POSITIVE (A) NEGATIVE    Comment:        THE SENSITIVITY OF THIS METHODOLOGY IS >24 mIU/mL   CBC     Status: Abnormal   Collection Time: 04/09/16  1:52 PM  Result Value Ref Range   WBC 4.9 4.0 - 10.5 K/uL   RBC 4.10 3.87 - 5.11 MIL/uL   Hemoglobin 10.5 (L) 12.0 - 15.0 g/dL   HCT 31.5 (L) 36.0 - 46.0 %   MCV 76.8 (L) 78.0 - 100.0 fL   MCH 25.6 (L) 26.0 - 34.0 pg   MCHC 33.3 30.0 - 36.0 g/dL   RDW 16.3 (H) 11.5 - 15.5 %   Platelets 301 150 - 400 K/uL  ABO/Rh     Status: None   Collection Time: 04/09/16  1:52 PM  Result Value Ref Range   ABO/RH(D) O POS   hCG, quantitative, pregnancy     Status: Abnormal   Collection Time: 04/09/16  1:52 PM  Result Value Ref Range   hCG, Beta Chain, Quant, S 66019 (H) <5 mIU/mL    Comment:          GEST. AGE      CONC.  (mIU/mL)   <=1 WEEK        5 - 50     2 WEEKS       50 - 500     3 WEEKS       100 - 10,000     4 WEEKS     1,000 - 30,000     5 WEEKS     3,500 - 115,000   6-8 WEEKS     12,000 - 270,000    12 WEEKS     15,000 - 220,000        FEMALE AND NON-PREGNANT FEMALE:     LESS THAN 5 mIU/mL     Review of Systems  Constitutional: Negative for fever and chills.  Gastrointestinal: Positive for nausea and abdominal pain.  Genitourinary: Positive for frequency. Negative for dysuria and urgency.   Physical Exam   Blood pressure 137/89, pulse 65, temperature 98.2 F (36.8 C), temperature source Oral, resp. rate 18, height 5' 2.5" (1.588 m), weight 215 lb (97.523 kg), last menstrual period 02/19/2016,  unknown if currently breastfeeding.  Physical Exam  Constitutional: She is oriented to person, place, and  time. She appears well-developed and well-nourished. No distress.  HENT:  Head: Normocephalic.  Eyes: Pupils are equal, round, and reactive to light.  Neck: Neck supple.  GI: Soft. She exhibits no distension and no mass. There is no tenderness. There is no rebound and no guarding.  Genitourinary:  Speculum exam: Vagina - Small amount of creamy discharge, no odor. IUD not observed  Cervix - No contact bleeding, IUD strings not palpated  Bimanual exam: Cervix closed Uterus non tender, normal size Adnexa non tender, no masses bilaterally Chaperone present for exam.  Musculoskeletal: Normal range of motion.  Neurological: She is alert and oriented to person, place, and time.  Skin: She is not diaphoretic.    MAU Course  Procedures  None  MDM  CBC CMP Wet prep GC Hcg [place]   Discussed patient with Dr. Nehemiah Settle  Assessment and Plan   A:  1. Chronic hypertension complicating or reason for care during pregnancy, first trimester   2. Abdominal pain in pregnancy, antepartum   3. History of incompetent cervix, currently pregnant, first trimester      P:  Discharge home in stable condition   Stop lisinopril- HCTZ BP check in the [facility] in one week 5/1; appointment made in the book for 1300; patient made aware.  Referral sent to the high risk clinic for MD visit Plan for ASA after 12 weeks.  Daily prenatal vitamins with iron over the counter as directed on the bottle.   Lezlie Lye, NP 04/09/2016 1:31 PM

## 2016-04-09 NOTE — Discharge Instructions (Signed)
First Trimester of Pregnancy  The first trimester of pregnancy is from week 1 until the end of week 12 (months 1 through 3). A week after a sperm fertilizes an egg, the egg will implant on the wall of the uterus. This embryo will begin to develop into a baby. Genes from you and your partner are forming the baby. The female genes determine whether the baby is a boy or a girl. At 6-8 weeks, the eyes and face are formed, and the heartbeat can be seen on ultrasound. At the end of 12 weeks, all the baby's organs are formed.   Now that you are pregnant, you will want to do everything you can to have a healthy baby. Two of the most important things are to get good prenatal care and to follow your health care provider's instructions. Prenatal care is all the medical care you receive before the baby's birth. This care will help prevent, find, and treat any problems during the pregnancy and childbirth.  BODY CHANGES  Your body goes through many changes during pregnancy. The changes vary from woman to woman.   · You may gain or lose a couple of pounds at first.  · You may feel sick to your stomach (nauseous) and throw up (vomit). If the vomiting is uncontrollable, call your health care provider.  · You may tire easily.  · You may develop headaches that can be relieved by medicines approved by your health care provider.  · You may urinate more often. Painful urination may mean you have a bladder infection.  · You may develop heartburn as a result of your pregnancy.  · You may develop constipation because certain hormones are causing the muscles that push waste through your intestines to slow down.  · You may develop hemorrhoids or swollen, bulging veins (varicose veins).  · Your breasts may begin to grow larger and become tender. Your nipples may stick out more, and the tissue that surrounds them (areola) may become darker.  · Your gums may bleed and may be sensitive to brushing and flossing.   · Dark spots or blotches (chloasma, mask of pregnancy) may develop on your face. This will likely fade after the baby is born.  · Your menstrual periods will stop.  · You may have a loss of appetite.  · You may develop cravings for certain kinds of food.  · You may have changes in your emotions from day to day, such as being excited to be pregnant or being concerned that something may go wrong with the pregnancy and baby.  · You may have more vivid and strange dreams.  · You may have changes in your hair. These can include thickening of your hair, rapid growth, and changes in texture. Some women also have hair loss during or after pregnancy, or hair that feels dry or thin. Your hair will most likely return to normal after your baby is born.  WHAT TO EXPECT AT YOUR PRENATAL VISITS  During a routine prenatal visit:  · You will be weighed to make sure you and the baby are growing normally.  · Your blood pressure will be taken.  · Your abdomen will be measured to track your baby's growth.  · The fetal heartbeat will be listened to starting around week 10 or 12 of your pregnancy.  · Test results from any previous visits will be discussed.  Your health care provider may ask you:  · How you are feeling.  · If you   including cigarettes, chewing tobacco, and electronic cigarettes. °· If you have any questions. °Other tests that may be performed during your first trimester include: °· Blood tests to find your blood type and to check for the presence of any previous infections. They will also be used to check for low iron levels (anemia) and Rh antibodies. Later in the pregnancy, blood tests for diabetes will be done along with other tests if problems develop. °· Urine tests to check for infections, diabetes, or protein in the urine. °· An ultrasound to confirm the proper growth  and development of the baby. °· An amniocentesis to check for possible genetic problems. °· Fetal screens for spina bifida and Down syndrome. °· You may need other tests to make sure you and the baby are doing well. °· HIV (human immunodeficiency virus) testing. Routine prenatal testing includes screening for HIV, unless you choose not to have this test. °HOME CARE INSTRUCTIONS  °Medicines °· Follow your health care provider's instructions regarding medicine use. Specific medicines may be either safe or unsafe to take during pregnancy. °· Take your prenatal vitamins as directed. °· If you develop constipation, try taking a stool softener if your health care provider approves. °Diet °· Eat regular, well-balanced meals. Choose a variety of foods, such as meat or vegetable-based protein, fish, milk and low-fat dairy products, vegetables, fruits, and whole grain breads and cereals. Your health care provider will help you determine the amount of weight gain that is right for you. °· Avoid raw meat and uncooked cheese. These carry germs that can cause birth defects in the baby. °· Eating four or five small meals rather than three large meals a day may help relieve nausea and vomiting. If you start to feel nauseous, eating a few soda crackers can be helpful. Drinking liquids between meals instead of during meals also seems to help nausea and vomiting. °· If you develop constipation, eat more high-fiber foods, such as fresh vegetables or fruit and whole grains. Drink enough fluids to keep your urine clear or pale yellow. °Activity and Exercise °· Exercise only as directed by your health care provider. Exercising will help you: °¨ Control your weight. °¨ Stay in shape. °¨ Be prepared for labor and delivery. °· Experiencing pain or cramping in the lower abdomen or low back is a good sign that you should stop exercising. Check with your health care provider before continuing normal exercises. °· Try to avoid standing for long  periods of time. Move your legs often if you must stand in one place for a long time. °· Avoid heavy lifting. °· Wear low-heeled shoes, and practice good posture. °· You may continue to have sex unless your health care provider directs you otherwise. °Relief of Pain or Discomfort °· Wear a good support bra for breast tenderness.   °· Take warm sitz baths to soothe any pain or discomfort caused by hemorrhoids. Use hemorrhoid cream if your health care provider approves.   °· Rest with your legs elevated if you have leg cramps or low back pain. °· If you develop varicose veins in your legs, wear support hose. Elevate your feet for 15 minutes, 3-4 times a day. Limit salt in your diet. °Prenatal Care °· Schedule your prenatal visits by the twelfth week of pregnancy. They are usually scheduled monthly at first, then more often in the last 2 months before delivery. °· Write down your questions. Take them to your prenatal visits. °· Keep all your prenatal visits as directed by your   health care provider. Safety  Wear your seat belt at all times when driving.  Make a list of emergency phone numbers, including numbers for family, friends, the hospital, and police and fire departments. General Tips  Ask your health care provider for a referral to a local prenatal education class. Begin classes no later than at the beginning of month 6 of your pregnancy.  Ask for help if you have counseling or nutritional needs during pregnancy. Your health care provider can offer advice or refer you to specialists for help with various needs.  Do not use hot tubs, steam rooms, or saunas.  Do not douche or use tampons or scented sanitary pads.  Do not cross your legs for long periods of time.  Avoid cat litter boxes and soil used by cats. These carry germs that can cause birth defects in the baby and possibly loss of the fetus by miscarriage or stillbirth.  Avoid all smoking, herbs, alcohol, and medicines not prescribed by  your health care provider. Chemicals in these affect the formation and growth of the baby.  Do not use any tobacco products, including cigarettes, chewing tobacco, and electronic cigarettes. If you need help quitting, ask your health care provider. You may receive counseling support and other resources to help you quit.  Schedule a dentist appointment. At home, brush your teeth with a soft toothbrush and be gentle when you floss. SEEK MEDICAL CARE IF:   You have dizziness.  You have mild pelvic cramps, pelvic pressure, or nagging pain in the abdominal area.  You have persistent nausea, vomiting, or diarrhea.  You have a bad smelling vaginal discharge.  You have pain with urination.  You notice increased swelling in your face, hands, legs, or ankles. SEEK IMMEDIATE MEDICAL CARE IF:   You have a fever.  You are leaking fluid from your vagina.  You have spotting or bleeding from your vagina.  You have severe abdominal cramping or pain.  You have rapid weight gain or loss.  You vomit blood or material that looks like coffee grounds.  You are exposed to [place] measles and have never had them.  You are exposed to fifth disease or chickenpox.  You develop a severe headache.  You have shortness of breath.  You have any kind of trauma, such as from a fall or a car accident.   This information is not intended to replace advice given to you by your health care provider. Make sure you discuss any questions you have with your health care provider.   Document Released: 11/27/2001 Document Revised: 12/24/2014 Document Reviewed: 10/13/2013 Elsevier Interactive Patient Education 2016 [employer]. Hypertension Hypertension, commonly called high blood pressure, is when the force of blood pumping through your arteries is too strong. Your arteries are the blood vessels that carry blood from your heart throughout your body. A blood pressure reading consists of a higher number over a lower  number, such as 110/72. The higher number (systolic) is the pressure inside your arteries when your heart pumps. The lower number (diastolic) is the pressure inside your arteries when your heart relaxes. Ideally you want your blood pressure below 120/80. Hypertension forces your heart to work harder to pump blood. Your arteries may become narrow or stiff. Having untreated or uncontrolled hypertension can cause heart attack, stroke, kidney disease, and other problems. RISK FACTORS Some risk factors for high blood pressure are controllable. Others are not.  Risk factors you cannot control include:   Race. You may be at  higher risk if you are African American.  Age. Risk increases with age.  Gender. Men are at higher risk than women before age 65 years. After age 15, women are at higher risk than men. Risk factors you can control include:  Not getting enough exercise or physical activity.  Being overweight.  Getting too much fat, sugar, calories, or salt in your diet.  Drinking too much alcohol. SIGNS AND SYMPTOMS Hypertension does not usually cause signs or symptoms. Extremely high blood pressure (hypertensive crisis) may cause headache, anxiety, shortness of breath, and nosebleed. DIAGNOSIS To check if you have hypertension, your health care provider will measure your blood pressure while you are seated, with your arm held at the level of your heart. It should be measured at least twice using the same arm. Certain conditions can cause a difference in blood pressure between your right and left arms. A blood pressure reading that is higher than normal on one occasion does not mean that you need treatment. If it is not clear whether you have high blood pressure, you may be asked to return on a different day to have your blood pressure checked again. Or, you may be asked to monitor your blood pressure at home for 1 or more weeks. TREATMENT Treating high blood pressure includes making lifestyle  changes and possibly taking medicine. Living a healthy lifestyle can help lower high blood pressure. You may need to change some of your habits. Lifestyle changes may include:  Following the DASH diet. This diet is high in fruits, vegetables, and whole grains. It is low in salt, red meat, and added sugars.  Keep your sodium intake below 2,300 mg per day.  Getting at least 30-45 minutes of aerobic exercise at least 4 times per week.  Losing weight if necessary.  Not smoking.  Limiting alcoholic beverages.  Learning ways to reduce stress. Your health care provider may prescribe medicine if lifestyle changes are not enough to get your blood pressure under control, and if one of the following is true:  You are 68-75 years of age and your systolic blood pressure is above 140.  You are 67 years of age or older, and your systolic blood pressure is above 150.  Your diastolic blood pressure is above 90.  You have diabetes, and your systolic blood pressure is over [id number] or your diastolic blood pressure is over 90.  You have kidney disease and your blood pressure is above 140/90.  You have heart disease and your blood pressure is above 140/90. Your personal target blood pressure may vary depending on your medical conditions, your age, and other factors. HOME CARE INSTRUCTIONS  Have your blood pressure rechecked as directed by your health care provider.   Take medicines only as directed by your health care provider. Follow the directions carefully. Blood pressure medicines must be taken as prescribed. The medicine does not work as well when you skip doses. Skipping doses also puts you at risk for problems.  Do not smoke.   Monitor your blood pressure at home as directed by your health care provider. SEEK MEDICAL CARE IF:   You think you are having a reaction to medicines taken.  You have recurrent headaches or feel dizzy.  You have swelling in your ankles.  You have trouble with  your vision. SEEK IMMEDIATE MEDICAL CARE IF:  You develop a severe headache or confusion.  You have unusual weakness, numbness, or feel faint.  You have severe chest or abdominal pain.  You  vomit repeatedly.  You have trouble breathing. MAKE SURE YOU:   Understand these instructions.  Will watch your condition.  Will get help right away if you are not doing well or get worse.   This information is not intended to replace advice given to you by your health care provider. Make sure you discuss any questions you have with your health care provider.   Document Released: 12/03/2005 Document Revised: 04/19/2015 Document Reviewed: 09/25/2013 Elsevier Interactive Patient Education [employer].

## 2016-04-09 NOTE — MAU Note (Signed)
Last 2 days, stomach has been hurting.  Has been peeing a lot for the last week.  +HPT this morning.  Has IUD.

## 2016-04-10 LAB — HIV ANTIBODY (ROUTINE TESTING W REFLEX): HIV SCREEN 4TH GENERATION: NONREACTIVE

## 2016-04-16 ENCOUNTER — Telehealth: Payer: Self-pay

## 2016-04-16 NOTE — Telephone Encounter (Signed)
Pt missed her blood pressure check today. I attempted to call patient but there was no answer or voicemail to leave message.

## 2016-04-17 ENCOUNTER — Encounter (HOSPITAL_COMMUNITY): Payer: Self-pay

## 2016-04-17 ENCOUNTER — Inpatient Hospital Stay (HOSPITAL_COMMUNITY): Payer: Medicaid Other

## 2016-04-17 ENCOUNTER — Inpatient Hospital Stay (HOSPITAL_COMMUNITY)
Admission: AD | Admit: 2016-04-17 | Discharge: 2016-04-17 | Disposition: A | Discharge status: Home / Self Care | Payer: Medicaid Other | Source: Ambulatory Visit | Attending: Family Medicine | Admitting: Family Medicine

## 2016-04-17 DIAGNOSIS — E039 Hypothyroidism, unspecified: Secondary | ICD-10-CM | POA: Diagnosis not present

## 2016-04-17 DIAGNOSIS — O034 Incomplete spontaneous abortion without complication: Secondary | ICD-10-CM | POA: Diagnosis not present

## 2016-04-17 DIAGNOSIS — O209 Hemorrhage in early pregnancy, unspecified: Secondary | ICD-10-CM

## 2016-04-17 DIAGNOSIS — F17210 Nicotine dependence, cigarettes, uncomplicated: Secondary | ICD-10-CM | POA: Diagnosis not present

## 2016-04-17 DIAGNOSIS — I10 Essential (primary) hypertension: Secondary | ICD-10-CM | POA: Insufficient documentation

## 2016-04-17 DIAGNOSIS — O031 Delayed or excessive hemorrhage following incomplete spontaneous abortion: Secondary | ICD-10-CM

## 2016-04-17 LAB — CBC
HCT: 26 % — ABNORMAL LOW (ref 36.0–46.0)
Hemoglobin: 8.6 g/dL — ABNORMAL LOW (ref 12.0–15.0)
MCH: 25.7 pg — AB (ref 26.0–34.0)
MCHC: 33.1 g/dL (ref 30.0–36.0)
MCV: 77.6 fL — ABNORMAL LOW (ref 78.0–100.0)
Platelets: 266 10*3/uL (ref 150–400)
RBC: 3.35 MIL/uL — AB (ref 3.87–5.11)
RDW: 16.2 % — ABNORMAL HIGH (ref 11.5–15.5)
WBC: 5.6 10*3/uL (ref 4.0–10.5)

## 2016-04-17 LAB — TYPE AND SCREEN
ABO/RH(D): O POS
ANTIBODY SCREEN: NEGATIVE

## 2016-04-17 MED ORDER — PROMETHAZINE HCL 12.5 MG PO TABS: 12.5000 mg | ORAL_TABLET | Freq: Four times a day (QID) | ORAL | Status: DC | PRN

## 2016-04-17 MED ORDER — MISOPROSTOL 200 MCG PO TABS
800.0000 ug | ORAL_TABLET | Freq: Once | ORAL | Status: AC
  Administered 2016-04-17: 800 ug via VAGINAL
  Filled 2016-04-17: qty 4

## 2016-04-17 MED ORDER — HYDROMORPHONE HCL 1 MG/ML IJ SOLN
1.0000 mg | Freq: Once | INTRAMUSCULAR | Status: AC
  Administered 2016-04-17: 1 mg via INTRAVENOUS
  Filled 2016-04-17: qty 1

## 2016-04-17 MED ORDER — OXYCODONE-ACETAMINOPHEN 5-325 MG PO TABS: 1.0000 | ORAL_TABLET | Freq: Four times a day (QID) | ORAL | Status: DC | PRN

## 2016-04-17 MED ORDER — LACTATED RINGERS IV BOLUS (SEPSIS)
1000.0000 mL | Freq: Once | INTRAVENOUS | Status: AC
  Administered 2016-04-17: 1000 mL via INTRAVENOUS

## 2016-04-17 NOTE — MAU Note (Signed)
Patient presents with c/o dizziness. Patient took 4 abortion pills this morning at 11:00am and has been bleeding since 11:45. Patient states she has changed her pad 10 times today.

## 2016-04-17 NOTE — MAU Provider Note (Signed)
Chief Complaint: Dizziness   First Provider Initiated Contact with Patient 04/17/16 1907     SUBJECTIVE HPI: Kylie Hicks is a 30 y.o. [id number] at [redacted]w[redacted]d by LMP and 7 week ultrasound in maternity admissions who presents to Maternity Admissions by EMS reporting heavy vaginal bleeding since taking medicine for an abortion this morning at 11 AM. States she has been passing large clots and soaking more than a pad an hour. Is not sure if she has passed a gestational sac.  Abd pain Location: Low abd Quality: Cramping Severity: 8/10 on pain scale Duration: 10 hours Context: Since taking abortion medicine Timing: Intermittent Modifying factors: There are no aggravating or alleviating factors. Patient hasn't taken anything for the pain. Associated signs and symptoms: Pos for vaginal bleeding and dizziness. Negative for fever, chills, urinary complaints, GI complaints.  Past Medical History  Diagnosis Date  . Anemia     with pregnancies  . Incompetent cervix     cerclage  . History of preterm labor, current pregnancy   . Chlamydia   . Hypothyroidism     No meds  . Postpartum depression 2006  . Influenza B positve PCR on 03/31/15 04/04/2015  . Hypertension    OB History  Gravida Para Term Preterm AB SAB TAB Ectopic Multiple Living  9 8 7 1  0 0 0 0 0 8    # Outcome Date GA Lbr Len/2nd Weight Sex Delivery Anes PTL Lv  9 Current           8 Term 04/29/14   6 lb 9 oz (2.977 kg) F Vag-Spont None N Y  7 Term 10/19/11 [redacted]w[redacted]d 14:00 / 00:01 6 lb 8.2 oz (2.955 kg) F Vag-Spont None  Y     Comments: On 17P  6 Term 11/24/10 [redacted]w[redacted]d  5 lb 10 oz (2.551 kg) F Vag-Spont EPI  Y     Comments: cerclage, on 17P  5 Term 04/15/08 [redacted]w[redacted]d 04:00 6 lb 3 oz (2.807 kg) F Vag-Spont EPI  Y     Comments: no complications  4 Preterm 03/29/07 [redacted]w[redacted]d 24:00 3 lb 2 oz (1.417 kg) M Vag-Spont EPI  Y  3 Term 02/06/05 [redacted]w[redacted]d 17:30 5 lb 3 oz (2.353 kg) M Vag-Spont EPI  Y     Comments: postpartum depression, on lexapro x 6 months   2 Term 06/08/03 [redacted]w[redacted]d 08:00 5 lb 10 oz (2.551 kg) M Vag-Spont EPI  Y     Comments: possible IUGR, states worried about growtj  1 Term      Vag-Spont        Past Surgical History  Procedure Laterality Date  . Cervical cerclage    . Pilonidal cyst / sinus excision    . Intrauterine device insertion    . Cervical cerclage N/A 02/18/2015    Procedure: CERCLAGE CERVICAL;  Surgeon: Woodroe Mode, MD;  Location: [facility];  Service: Gynecology;  Laterality: N/A;   Social History   Social History  . Marital Status: Significant Other    Spouse Name: N/A  . Number of Children: N/A  . Years of Education: N/A   Occupational History  . Not on file.   Social History Main Topics  . Smoking status: Current Every Day Smoker -- 0.25 packs/day for 3 years    Types: Cigarettes  . Smokeless tobacco: Never Used  . Alcohol Use: No  . Drug Use: No  . Sexual Activity: Yes    Birth Control/ Protection: IUD     Comment: preg  Other Topics Concern  . Not on file   Social History Narrative   No current facility-administered medications on file prior to encounter.   Current Outpatient Prescriptions on File Prior to Encounter  Medication Sig Dispense Refill  . diphenhydrAMINE (BENADRYL) 25 MG tablet Take 25 mg by mouth daily as needed for allergies. Reported on 04/17/2016     No Known Allergies  I have reviewed the past Medical Hx, Surgical Hx, Social Hx, Allergies and Medications.   Review of Systems  Constitutional: Negative for fever and chills.  Gastrointestinal: Positive for abdominal pain. Negative for nausea and vomiting.  Genitourinary: Positive for vaginal bleeding. Negative for dysuria.  Neurological: Positive for dizziness.    OBJECTIVE Patient Vitals for the past 24 hrs:  BP Temp Temp src Pulse Resp  04/17/16 2002 125/58 mmHg - - - -  04/17/16 1846 (!) 116/54 mmHg 98 F (36.7 C) Oral 64 18   Constitutional: Well-developed, well-nourished female in Mild distress. Mild  pallor. Cardiovascular: normal rate Respiratory: normal rate and effort.  GI: Abd soft, mild tenderness. MS: Extremities nontender, no edema, normal ROM Neurologic: Alert and oriented x 4.  GU: Neg CVAT.  SPECULUM EXAM: NEFG, large blood clot with small amount of active bleeding noted, cervix clean, visually dilated 1 cm. Tissue seen at os. Attempted to grasp it with ring forceps, but unable to grab a significant amount of tissue. Patient unable to tolerate. Procedure stopped.  BIMANUAL: cervix 1 cm; uterus 8 weeks size, no adnexal tenderness or masses. No CMT.  LAB RESULTS Results for orders placed or performed during the hospital encounter of 04/17/16 (from the past 24 hour(s))  CBC     Status: Abnormal   Collection Time: 04/17/16  7:30 PM  Result Value Ref Range   WBC 5.6 4.0 - 10.5 K/uL   RBC 3.35 (L) 3.87 - 5.11 MIL/uL   Hemoglobin 8.6 (L) 12.0 - 15.0 g/dL   HCT 26.0 (L) 36.0 - 46.0 %   MCV 77.6 (L) 78.0 - 100.0 fL   MCH 25.7 (L) 26.0 - 34.0 pg   MCHC 33.1 30.0 - 36.0 g/dL   RDW 16.2 (H) 11.5 - 15.5 %   Platelets 266 150 - 400 K/uL   MAU COURSE IV bolus, CBC, type and screen, ultrasound, speculum exam, NPO.  Care of Pt turned over to Kerry Hough, at 2000. Dilaudid ordered.   [place], [place] 04/17/2016  8:15 PM

## 2016-04-17 NOTE — Discharge Instructions (Signed)
Incomplete Miscarriage A miscarriage is the sudden loss of an unborn baby (fetus) before the 20th week of pregnancy. In an incomplete miscarriage, parts of the fetus or placenta (afterbirth) remain in the body.  Having a miscarriage can be an emotional experience. Talk with your health care provider about any questions you may have about miscarrying, the grieving process, and your future pregnancy plans. CAUSES   Problems with the fetal chromosomes that make it impossible for the baby to develop normally. Problems with the baby's genes or chromosomes are most often the result of errors that occur by chance as the embryo divides and grows. The problems are not inherited from the parents.  Infection of the cervix or uterus.  Hormone problems.  Problems with the cervix, such as having an incompetent cervix. This is when the tissue in the cervix is not strong enough to hold the pregnancy.  Problems with the uterus, such as an abnormally shaped uterus, uterine fibroids, or congenital abnormalities.  Certain medical conditions.  Smoking, drinking alcohol, or taking illegal drugs.  Trauma. SYMPTOMS   Vaginal bleeding or spotting, with or without cramps or pain.  Pain or cramping in the abdomen or lower back.  Passing fluid, tissue, or blood clots from the vagina. DIAGNOSIS  Your health care provider will perform a physical exam. You may also have an ultrasound to confirm the miscarriage. Blood or urine tests may also be ordered. TREATMENT   Usually, a dilation and curettage (D&C) procedure is performed. During a D&C procedure, the cervix is widened (dilated) and any remaining fetal or placental tissue is gently removed from the uterus.  Antibiotic medicines are prescribed if there is an infection. Other medicines may be given to reduce the size of the uterus (contract) if there is a lot of bleeding.  If you have Rh negative blood and your baby was Rh positive, you will need a Rho (D)  immune globulin shot. This shot will protect any future baby from having Rh blood problems in future pregnancies.  You may be confined to bed rest. This means you should stay in bed and only get up to use the bathroom. HOME CARE INSTRUCTIONS   Rest as directed by your health care provider.  Restrict activity as directed by your health care provider. You may be allowed to continue light activity if curettage was not done but you require further treatment.  Keep track of the number of pads you use each day. Keep track of how soaked (saturated) they are. Record this information.  Do not  use tampons.  Do not douche or have sexual intercourse until approved by your health care provider.  Keep all follow-up appointments for reevaluation and continuing management.  Only take over-the-counter or prescription medicines for pain, fever, or discomfort as directed by your health care provider.  Take antibiotic medicine as directed by your health care provider. Make sure you finish it even if you start to feel better. SEEK IMMEDIATE MEDICAL CARE IF:   You experience severe cramps in your stomach, back, or abdomen.  You have an unexplained temperature (make sure to record these temperatures).  You pass large clots or tissue (save these for your health care provider to inspect).  Your bleeding increases.  You become light-headed, weak, or have fainting episodes. MAKE SURE YOU:   Understand these instructions.  Will watch your condition.  Will get help right away if you are not doing well or get worse.   This information is not intended to   replace advice given to you by your health care provider. Make sure you discuss any questions you have with your health care provider.   Document Released: 12/03/2005 Document Revised: 12/24/2014 Document Reviewed: 07/02/2013 Elsevier Interactive Patient Education 2016 Elsevier Inc[employer]  

## 2016-04-18 NOTE — Telephone Encounter (Signed)
Spoke with patient who stated she was seen in the ER for blood pressure issue and missed abortion. Pt has been scheduled for a follow up and blood pressure check this week.

## 2016-04-20 ENCOUNTER — Ambulatory Visit: Payer: Medicaid Other

## 2016-04-26 ENCOUNTER — Ambulatory Visit: Payer: Medicaid Other | Admitting: Family Medicine

## 2016-04-26 NOTE — MAU Provider Note (Signed)
2000 - Care assumed from [place], [place]. Patient in [place].   Discussed results with Dr. Kennon Rounds. Patient's bleeding is much improved upon return from [place]. VSS.  Dr. Kennon Rounds recommends second dose of Cytotec and discharge to follow-up with [facility] in ~ 2 weeks with bleeding precautions.  Discussed plan with patient. Patient would prefer Cytotec placement in MAU by RN   A: Retained POC following abortion  P: Discharge home Rx for Percocet, Phenergan and Ibuprofen given to patient  Bleeding precautions discussed Patient advised to follow-up with [facility]. They will call her with an appointment.  Patient may return to MAU as needed or if her condition were to change or worsen  Kylie Redden, PA-C  04/26/2016 10:16 AM

## 2016-04-30 ENCOUNTER — Encounter: Payer: Medicaid Other | Admitting: Obstetrics & Gynecology

## 2016-06-06 ENCOUNTER — Ambulatory Visit (INDEPENDENT_AMBULATORY_CARE_PROVIDER_SITE_OTHER): Payer: Medicaid Other | Admitting: *Deleted

## 2016-06-06 DIAGNOSIS — Z23 Encounter for immunization: Secondary | ICD-10-CM | POA: Diagnosis not present

## 2016-06-06 NOTE — Progress Notes (Signed)
Pt arrived for flu shot as scheduled. 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 Per Ladoris Gene - registrar, she was told last week by Verdell Carmine, RN that pt could have flu shot here because we still had vaccine available and therefore pt was given appt. I consulted with pharmacist prior to counseling pt. I advised pt that flu season has passed and the benefit form the vaccine is low because the incidence of flu cases is extremely low. Pt voiced understanding and stated that she had asked about this when she signed up for the CNA class. 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 Pt stated desire to receive the vaccine today.

## 2016-06-27 ENCOUNTER — Ambulatory Visit: Payer: Medicaid Other | Admitting: Obstetrics & Gynecology

## 2016-07-17 ENCOUNTER — Encounter: Payer: Self-pay | Admitting: Obstetrics & Gynecology

## 2016-08-05 ENCOUNTER — Encounter: Payer: Self-pay | Admitting: Advanced Practice Midwife

## 2016-08-18 ENCOUNTER — Encounter (HOSPITAL_COMMUNITY): Payer: Self-pay

## 2016-08-18 ENCOUNTER — Inpatient Hospital Stay (HOSPITAL_COMMUNITY): Payer: Medicaid Other

## 2016-08-18 ENCOUNTER — Inpatient Hospital Stay (HOSPITAL_COMMUNITY)
Admission: AD | Admit: 2016-08-18 | Discharge: 2016-08-18 | Disposition: A | Discharge status: Home / Self Care | Payer: Medicaid Other | Source: Ambulatory Visit | Attending: Obstetrics & Gynecology | Admitting: Obstetrics & Gynecology

## 2016-08-18 DIAGNOSIS — O99281 Endocrine, nutritional and metabolic diseases complicating pregnancy, first trimester: Secondary | ICD-10-CM | POA: Diagnosis not present

## 2016-08-18 DIAGNOSIS — F17210 Nicotine dependence, cigarettes, uncomplicated: Secondary | ICD-10-CM | POA: Diagnosis not present

## 2016-08-18 DIAGNOSIS — O161 Unspecified maternal hypertension, first trimester: Secondary | ICD-10-CM | POA: Insufficient documentation

## 2016-08-18 DIAGNOSIS — N8311 Corpus luteum cyst of right ovary: Secondary | ICD-10-CM | POA: Insufficient documentation

## 2016-08-18 DIAGNOSIS — R109 Unspecified abdominal pain: Secondary | ICD-10-CM

## 2016-08-18 DIAGNOSIS — R002 Palpitations: Secondary | ICD-10-CM | POA: Diagnosis not present

## 2016-08-18 DIAGNOSIS — A499 Bacterial infection, unspecified: Secondary | ICD-10-CM | POA: Diagnosis not present

## 2016-08-18 DIAGNOSIS — O26899 Other specified pregnancy related conditions, unspecified trimester: Secondary | ICD-10-CM

## 2016-08-18 DIAGNOSIS — O23591 Infection of other part of genital tract in pregnancy, first trimester: Secondary | ICD-10-CM | POA: Insufficient documentation

## 2016-08-18 DIAGNOSIS — I350 Nonrheumatic aortic (valve) stenosis: Secondary | ICD-10-CM | POA: Insufficient documentation

## 2016-08-18 DIAGNOSIS — R200 Anesthesia of skin: Secondary | ICD-10-CM | POA: Diagnosis not present

## 2016-08-18 DIAGNOSIS — O99011 Anemia complicating pregnancy, first trimester: Secondary | ICD-10-CM | POA: Insufficient documentation

## 2016-08-18 DIAGNOSIS — R102 Pelvic and perineal pain: Secondary | ICD-10-CM

## 2016-08-18 DIAGNOSIS — Z8619 Personal history of other infectious and parasitic diseases: Secondary | ICD-10-CM | POA: Diagnosis not present

## 2016-08-18 DIAGNOSIS — F1290 Cannabis use, unspecified, uncomplicated: Secondary | ICD-10-CM | POA: Diagnosis not present

## 2016-08-18 DIAGNOSIS — N760 Acute vaginitis: Secondary | ICD-10-CM | POA: Diagnosis not present

## 2016-08-18 DIAGNOSIS — E039 Hypothyroidism, unspecified: Secondary | ICD-10-CM | POA: Insufficient documentation

## 2016-08-18 DIAGNOSIS — D509 Iron deficiency anemia, unspecified: Secondary | ICD-10-CM

## 2016-08-18 DIAGNOSIS — O26891 Other specified pregnancy related conditions, first trimester: Secondary | ICD-10-CM | POA: Diagnosis not present

## 2016-08-18 DIAGNOSIS — Z3A01 Less than 8 weeks gestation of pregnancy: Secondary | ICD-10-CM | POA: Insufficient documentation

## 2016-08-18 DIAGNOSIS — O99331 Smoking (tobacco) complicating pregnancy, first trimester: Secondary | ICD-10-CM | POA: Diagnosis not present

## 2016-08-18 DIAGNOSIS — Z3201 Encounter for pregnancy test, result positive: Secondary | ICD-10-CM | POA: Insufficient documentation

## 2016-08-18 DIAGNOSIS — B9689 Other specified bacterial agents as the cause of diseases classified elsewhere: Secondary | ICD-10-CM

## 2016-08-18 LAB — CBC
HEMATOCRIT: 32.1 % — AB (ref 36.0–46.0)
Hemoglobin: 10.3 g/dL — ABNORMAL LOW (ref 12.0–15.0)
MCH: 22.3 pg — ABNORMAL LOW (ref 26.0–34.0)
MCHC: 32.1 g/dL (ref 30.0–36.0)
MCV: 69.5 fL — ABNORMAL LOW (ref 78.0–100.0)
Platelets: 361 10*3/uL (ref 150–400)
RBC: 4.62 MIL/uL (ref 3.87–5.11)
RDW: 20.3 % — AB (ref 11.5–15.5)
WBC: 3.6 10*3/uL — AB (ref 4.0–10.5)

## 2016-08-18 LAB — WET PREP, GENITAL
SPERM: NONE SEEN
TRICH WET PREP: NONE SEEN
Yeast Wet Prep HPF POC: NONE SEEN

## 2016-08-18 LAB — URINALYSIS, ROUTINE W REFLEX MICROSCOPIC
Bilirubin Urine: NEGATIVE
GLUCOSE, UA: NEGATIVE mg/dL
KETONES UR: NEGATIVE mg/dL
LEUKOCYTES UA: NEGATIVE
NITRITE: NEGATIVE
PH: 6 (ref 5.0–8.0)
Protein, ur: NEGATIVE mg/dL
SPECIFIC GRAVITY, URINE: 1.025 (ref 1.005–1.030)

## 2016-08-18 LAB — POCT PREGNANCY, URINE: Preg Test, Ur: POSITIVE — AB

## 2016-08-18 LAB — URINE MICROSCOPIC-ADD ON: BACTERIA UA: NONE SEEN

## 2016-08-18 LAB — HCG, QUANTITATIVE, PREGNANCY: HCG, BETA CHAIN, QUANT, S: 4112 m[IU]/mL — AB (ref <5)

## 2016-08-18 MED ORDER — FERROUS SULFATE 325 (65 FE) MG PO TABS: 325.0000 mg | ORAL_TABLET | Freq: Two times a day (BID) | ORAL | 1 refills | Status: DC

## 2016-08-18 MED ORDER — LABETALOL HCL 200 MG PO TABS: 200.0000 mg | ORAL_TABLET | Freq: Two times a day (BID) | ORAL | 2 refills | Status: DC

## 2016-08-18 MED ORDER — METRONIDAZOLE 500 MG PO TABS
500.0000 mg | ORAL_TABLET | Freq: Two times a day (BID) | ORAL | 0 refills | Status: DC
Start: 2016-08-18 — End: 2017-09-27

## 2016-08-18 NOTE — MAU Provider Note (Signed)
History   [id number]   Chief Complaint  Patient presents with  . Generalized Body Aches  . Numbness    hands    HPI Kylie Hicks is a 30 y.o. female  [phone] here with report of generalized body aches and numbness of hands intermittently throughout the day.  Upon review of the records patient was seen in MAU on 04/17/16 with heavy bleeding and pain s/p a medical AB.  Pt diagnosed with retained products of conception and prescribed an additional dose of cytotec with follow-up in office two weeks later.  Patient did not have follow-up appointment.  Reports having normal menstrual cycles after that event with the Patient's last menstrual period 06/14/2016.  Denies any abnormal vaginal bleeding since LMP.  Reports right sided pelvic pain that's intermittent in nature.  Patient states an IUD was placed after abortion, but does not recall when.    Denies fever or recent exposure to ill individuals.     Patient's last menstrual period was 02/19/2016.  OB History  Gravida Para Term Preterm AB Living  9 8 7 1  0 8  SAB TAB Ectopic Multiple Live Births  0 0 0 0 7    # Outcome Date GA Lbr Len/2nd Weight Sex Delivery Anes PTL Lv  9 Gravida           8 Term 04/29/14   6 lb 9 oz (2.977 kg) F Vag-Spont None N LIV  7 Term 10/19/11 [redacted]w[redacted]d 14:00 / 00:01 6 lb 8.2 oz (2.955 kg) F Vag-Spont None  LIV     Birth Comments: On 17P  6 Term 11/24/10 [redacted]w[redacted]d  5 lb 10 oz (2.551 kg) F Vag-Spont EPI  LIV     Birth Comments: cerclage, on 17P  5 Term 04/15/08 [redacted]w[redacted]d 04:00 6 lb 3 oz (2.807 kg) F Vag-Spont EPI  LIV     Birth Comments: no complications  4 Preterm 03/29/07 [redacted]w[redacted]d 24:00 3 lb 2 oz (1.417 kg) M Vag-Spont EPI  LIV  3 Term 02/06/05 [redacted]w[redacted]d 17:30 5 lb 3 oz (2.353 kg) M Vag-Spont EPI  LIV     Birth Comments: postpartum depression, on lexapro x 6 months  2 Term 06/08/03 [redacted]w[redacted]d 08:00 5 lb 10 oz (2.551 kg) M Vag-Spont EPI  LIV     Birth Comments: possible IUGR, states worried about growtj  1 Term      Vag-Spont         Patient Active Problem List   Diagnosis Date Noted  . Mild aortic stenosis 04/29/2015  . Palpitations 03/03/2015  . Marijuana use 12/21/2014  . History of postpartum depression 09/10/2011  . Hypertension     Past Medical History:  Diagnosis Date  . Anemia    with pregnancies  . Chlamydia   . History of preterm labor, current pregnancy   . Hypertension   . Hypothyroidism    No meds  . Incompetent cervix    cerclage  . Influenza B positve PCR on 03/31/15 04/04/2015  . Postpartum depression 2006    Family History  Problem Relation Age of Onset  . Hypertension Mother   . Cancer Sister     Social History   Social History  . Marital status: Significant Other    Spouse name: N/A  . Number of children: N/A  . Years of education: N/A   Social History Main Topics  . Smoking status: Current Every Day Smoker    Packs/day: 0.25    Years: 3.00    Types: Cigarettes  .  Smokeless tobacco: Never Used  . Alcohol use No  . Drug use: No  . Sexual activity: Yes    Birth control/ protection: IUD     Comment: preg   Other Topics Concern  . Not on file   Social History Narrative  . No narrative on file    No Known Allergies  No current facility-administered medications on file prior to encounter.    Current Outpatient Prescriptions on File Prior to Encounter  Medication Sig Dispense Refill  . diphenhydrAMINE (BENADRYL) 25 MG tablet Take 25 mg by mouth daily as needed for allergies. Reported on 04/17/2016    . hydrochlorothiazide (HYDRODIURIL) 12.5 MG tablet Take 12.5 mg by mouth daily.    Marland Kitchen ibuprofen (ADVIL,MOTRIN) 800 MG tablet Take 800 mg by mouth every 8 (eight) hours as needed for moderate pain.    Marland Kitchen oxyCODONE-acetaminophen (PERCOCET/ROXICET) 5-325 MG tablet Take 1 tablet by mouth every 6 (six) hours as needed for severe pain. 12 tablet 0  . promethazine (PHENERGAN) 12.5 MG tablet Take 1 tablet (12.5 mg total) by mouth every 6 (six) hours as needed for nausea or  vomiting. 30 tablet 0     Review of Systems  Constitutional: Positive for chills. Negative for fever.  HENT: Negative for sore throat.   Eyes: Negative for visual disturbance.  Respiratory: Positive for cough. Negative for choking.   Cardiovascular: Negative for chest pain.  Gastrointestinal: Positive for nausea. Negative for vomiting.  Genitourinary: Positive for pelvic pain. Negative for dysuria, flank pain, vaginal bleeding and vaginal discharge.  Musculoskeletal: Positive for myalgias (generalized body aches).  Neurological: Positive for numbness (bilateral hand) and headaches. Negative for dizziness.     Physical Exam   Vitals:   08/18/16 0936  Weight: 210 lb 0.6 oz (95.3 kg)  Height: 5\' 3"  (1.6 m)    Physical Exam  Constitutional: She is oriented to person, place, and time. She appears well-developed and well-nourished. No distress.  HENT:  Head: Normocephalic.  Eyes: EOM are normal. Pupils are equal, round, and reactive to light.  Neck: Normal range of motion. Neck supple.  Cardiovascular: Normal rate, regular rhythm and normal heart sounds.  Exam reveals no gallop and no friction rub.   No murmur heard. Respiratory: Effort normal and breath sounds normal. No respiratory distress.  GI: Soft. There is no tenderness. There is no CVA tenderness.  Genitourinary: Uterus is enlarged. Cervix exhibits no motion tenderness and no discharge. Right adnexum displays tenderness. Right adnexum displays no mass and no fullness. Left adnexum displays no mass, no tenderness and no fullness. No bleeding in the vagina. Vaginal discharge (white, creamy) found.  Genitourinary Comments: IUD strings not present or seen  Musculoskeletal: Normal range of motion.  Upper extremities with normal pulse and sensory/motor exam.  Neurological: She is alert and oriented to person, place, and time. No cranial nerve deficit.  Skin: Skin is warm and dry.  Psychiatric: She has a normal mood and affect.     MAU Course  Procedures  Results for orders placed or performed during the hospital encounter of 08/18/16 (from the past 24 hour(s))  Urinalysis, Routine w reflex microscopic (not at [facility])     Status: Abnormal   Collection Time: 08/18/16  9:35 AM  Result Value Ref Range   Color, Urine YELLOW YELLOW   APPearance CLEAR CLEAR   Specific Gravity, Urine 1.025 1.005 - 1.030   pH 6.0 5.0 - 8.0   Glucose, UA NEGATIVE NEGATIVE mg/dL   Hgb urine dipstick  SMALL (A) NEGATIVE   Bilirubin Urine NEGATIVE NEGATIVE   Ketones, ur NEGATIVE NEGATIVE mg/dL   Protein, ur NEGATIVE NEGATIVE mg/dL   Nitrite NEGATIVE NEGATIVE   Leukocytes, UA NEGATIVE NEGATIVE  Urine microscopic-add on     Status: Abnormal   Collection Time: 08/18/16  9:35 AM  Result Value Ref Range   Squamous Epithelial / LPF 0-5 (A) NONE SEEN   WBC, UA 0-5 0 - 5 WBC/hpf   RBC / HPF 0-5 0 - 5 RBC/hpf   Bacteria, UA NONE SEEN NONE SEEN   Casts HYALINE CASTS (A) NEGATIVE   Urine-Other MUCOUS PRESENT   Pregnancy, urine POC     Status: Abnormal   Collection Time: 08/18/16  9:41 AM  Result Value Ref Range   Preg Test, Ur POSITIVE (A) NEGATIVE  Wet prep, genital     Status: Abnormal   Collection Time: 08/18/16 10:00 AM  Result Value Ref Range   Yeast Wet Prep HPF POC NONE SEEN NONE SEEN   Trich, Wet Prep NONE SEEN NONE SEEN   Clue Cells Wet Prep HPF POC PRESENT (A) NONE SEEN   WBC, Wet Prep HPF POC MODERATE (A) NONE SEEN   Sperm NONE SEEN   CBC     Status: Abnormal   Collection Time: 08/18/16 10:08 AM  Result Value Ref Range   WBC 3.6 (L) 4.0 - 10.5 K/uL   RBC 4.62 3.87 - 5.11 MIL/uL   Hemoglobin 10.3 (L) 12.0 - 15.0 g/dL   HCT 32.1 (L) 36.0 - 46.0 %   MCV 69.5 (L) 78.0 - 100.0 fL   MCH 22.3 (L) 26.0 - 34.0 pg   MCHC 32.1 30.0 - 36.0 g/dL   RDW 20.3 (H) 11.5 - 15.5 %   Platelets 361 150 - 400 K/uL    Ultrasound: FINDINGS: Intrauterine gestational sac: Single  Yolk sac:  Present  Embryo:  Not visualized  MSD: 7.0   mm   5 w   3  d  Subchorionic hemorrhage:  None visualized.  Maternal uterus/adnexae: Bilateral ovaries are within normal limits, noting a right corpus luteal cyst.  No free fluid.  IMPRESSION: Single intrauterine gestational sac with yolk sac, measuring 5 weeks 3 days by mean sac diameter.  No fetal pole is visualized, likely related to early gestational age. Consider follow-up pelvic ultrasound in 14 days as clinically warranted.  Assessment and Plan   30 y.o. [id number] at [redacted]w[redacted]d IUP Anemia Bacterial Vaginosis  Plan: Discharge home RX Flagyl 500 mg BID x 7 days RX Ferrous Sulfate 325 mg BID Stop HCTZ; begin Labetalol 200 mg BID Pregnancy precautions Follow-up ultrasound in 10 days at [facility]  Begin prenatal vitamins  Gwen Pounds, [place] 08/18/2016 11:30 AM

## 2016-08-18 NOTE — Discharge Instructions (Signed)

## 2016-08-18 NOTE — MAU Note (Signed)
Pt states she all over pain in her body, headache, congestion, and numbness in her hands. This has been going on for a week. Pt's LMP was 07/14/16.

## 2016-08-19 LAB — HIV ANTIBODY (ROUTINE TESTING W REFLEX): HIV SCREEN 4TH GENERATION: NONREACTIVE

## 2016-08-21 LAB — GC/CHLAMYDIA PROBE AMP (~~LOC~~) NOT AT ARMC
Chlamydia: NEGATIVE
Neisseria Gonorrhea: NEGATIVE

## 2016-08-28 ENCOUNTER — Ambulatory Visit: Payer: Medicaid Other | Admitting: Advanced Practice Midwife

## 2016-09-19 ENCOUNTER — Ambulatory Visit (HOSPITAL_COMMUNITY)
Admission: RE | Admit: 2016-09-19 | Discharge: 2016-09-19 | Disposition: A | Discharge status: Home / Self Care | Payer: Medicaid Other | Source: Ambulatory Visit | Attending: Family | Admitting: Family

## 2016-09-19 DIAGNOSIS — Z362 Encounter for other antenatal screening follow-up: Secondary | ICD-10-CM | POA: Diagnosis not present

## 2016-09-19 DIAGNOSIS — Z3A09 9 weeks gestation of pregnancy: Secondary | ICD-10-CM | POA: Insufficient documentation

## 2016-09-19 DIAGNOSIS — O26891 Other specified pregnancy related conditions, first trimester: Secondary | ICD-10-CM

## 2016-09-19 DIAGNOSIS — R102 Pelvic and perineal pain: Secondary | ICD-10-CM

## 2016-10-03 ENCOUNTER — Encounter: Payer: Self-pay | Admitting: Obstetrics and Gynecology

## 2016-10-03 ENCOUNTER — Encounter: Payer: Medicaid Other | Admitting: Obstetrics and Gynecology

## 2016-10-03 NOTE — Progress Notes (Signed)
Patient did not keep New OB appointment for 10/03/2016.  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

## 2017-06-23 ENCOUNTER — Encounter (HOSPITAL_COMMUNITY): Payer: Self-pay

## 2017-08-12 IMAGING — US US OB TRANSVAGINAL
1 series · 15 of 28 positions shown · non-contrast
Comparison: None.

CLINICAL DATA: Pregnant, abdominal

EXAM:
OBSTETRIC <14 WK US AND TRANSVAGINAL OB US
TECHNIQUE: Both transabdominal and transvaginal ultrasound examinations were
performed for complete evaluation of the gestation as well as the
maternal uterus, adnexal regions, and pelvic cul-de-sac.
Transvaginal technique was performed to assess early pregnancy.

[Series 1: us ob transvaginal · 33 acquisitions, 15 frames shown]
[im 1/33]
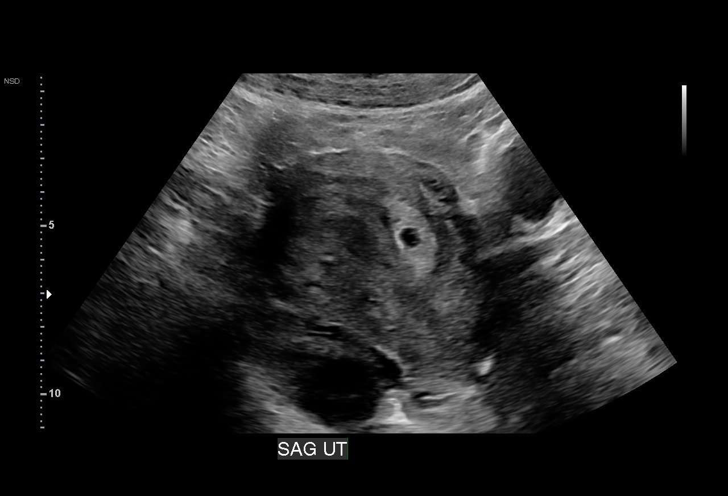
[im 3/33]
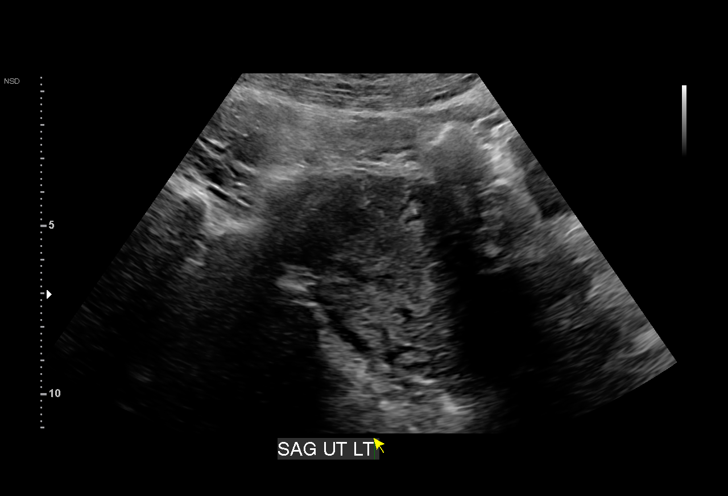
[im 5/33]
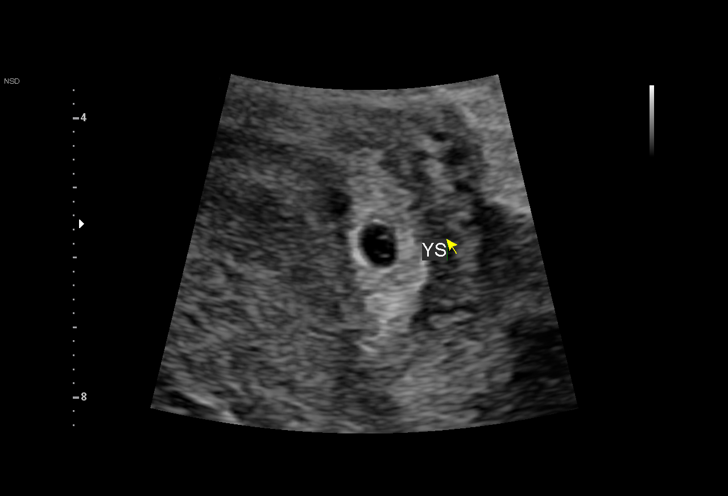
[im 8/33]
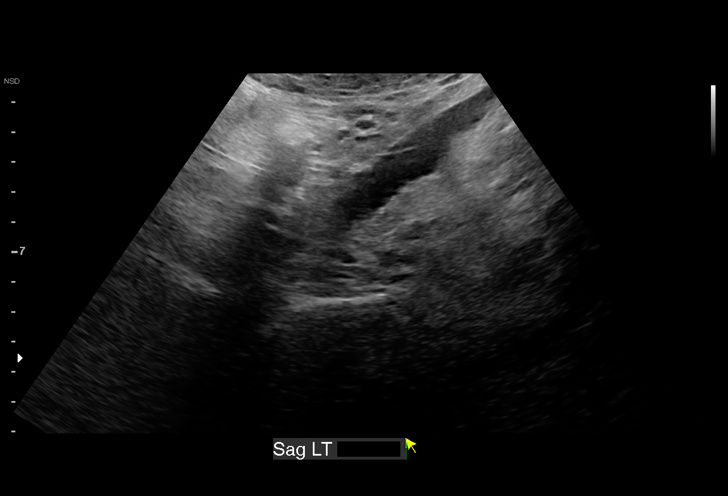
[im 10/33]
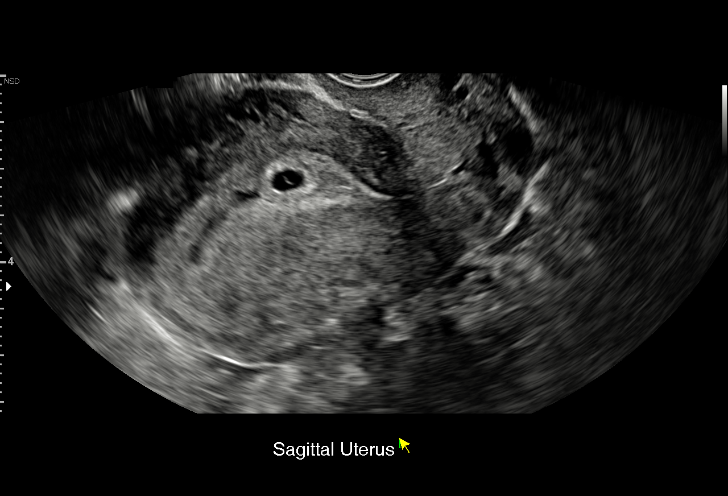
[im 12/33]
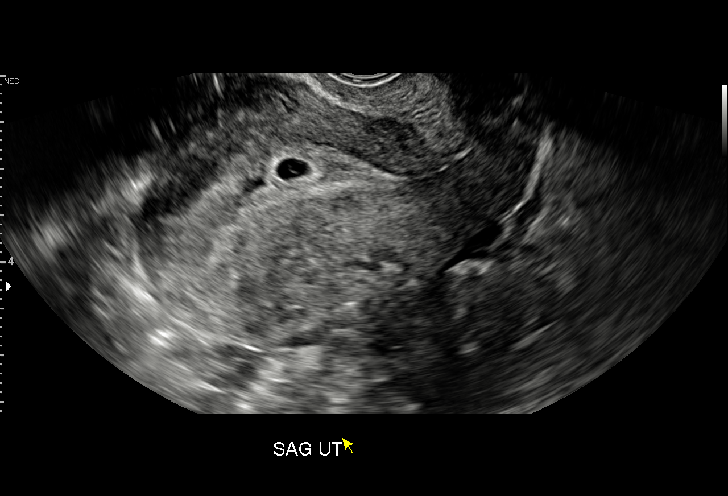
[im 15/33]
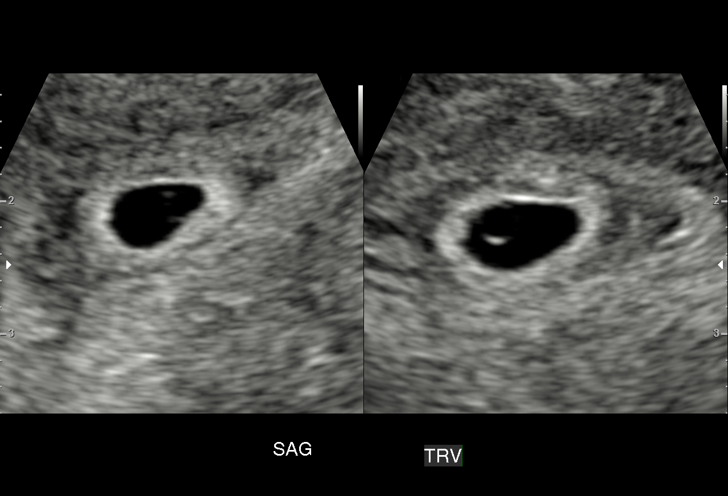
[im 17/33]
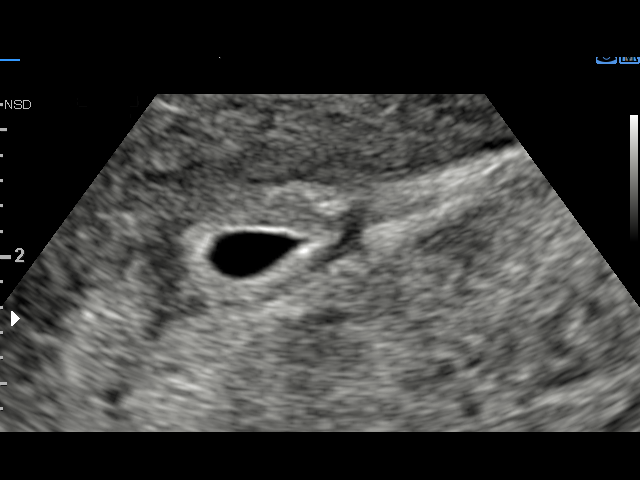
[im 18/33]
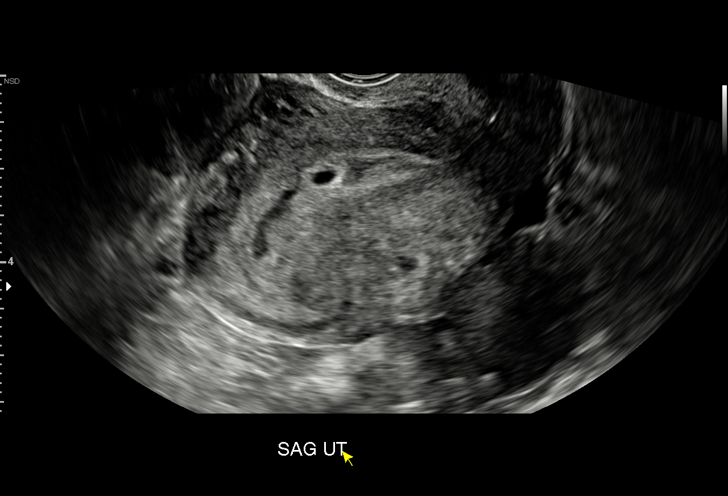
[im 21/33]
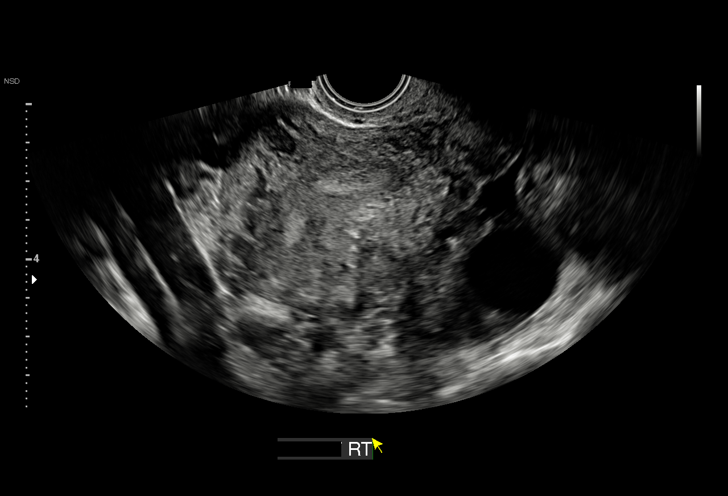
[im 23/33]
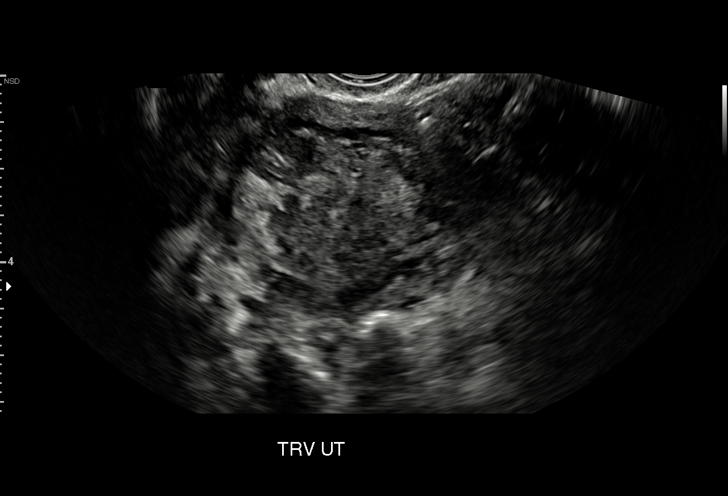
[im 25/33]
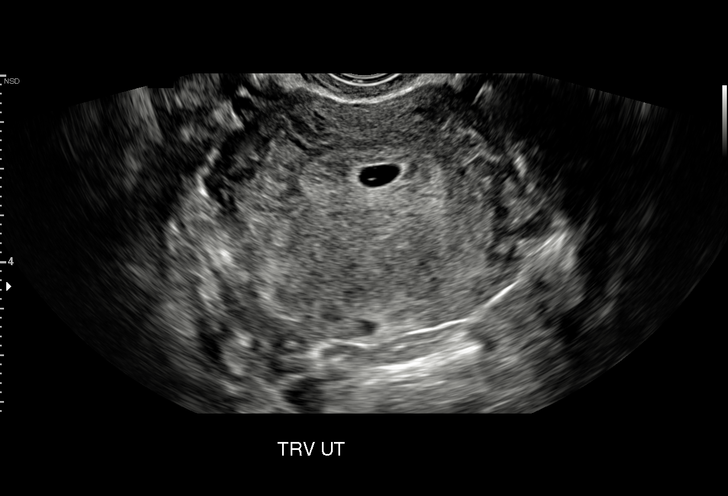
[im 28/33]
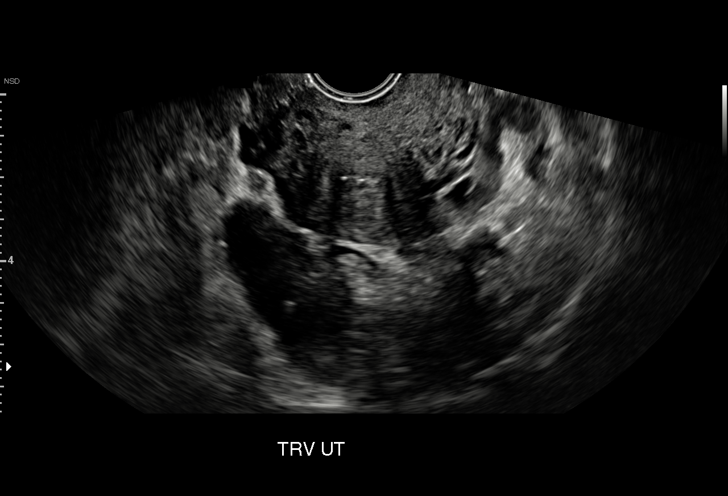
[im 30/33]
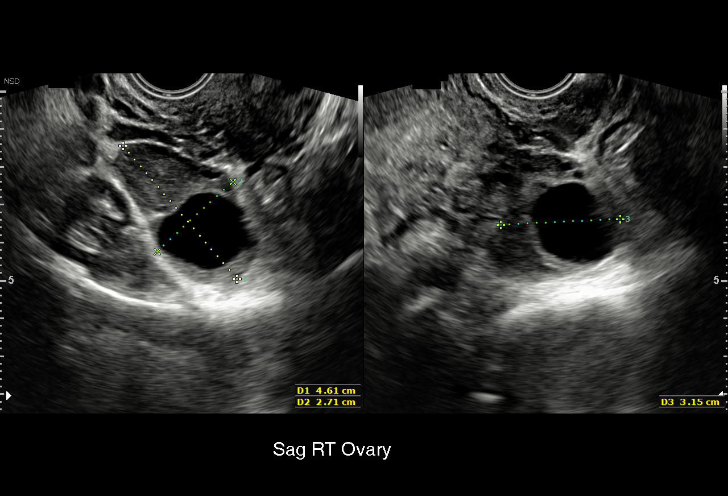
[im 33/33]
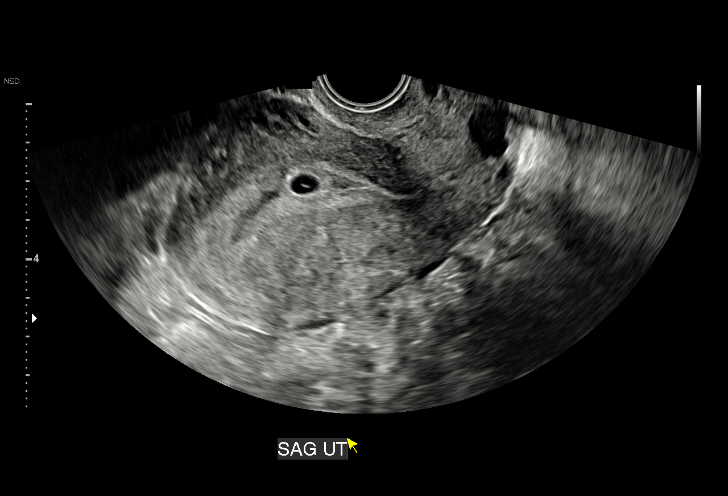

[15 of 28 positions shown; findings below may reference images not displayed]

FINDINGS: Intrauterine gestational sac: Single

Yolk sac:  Present

Embryo:  Not visualized

MSD: 7.0  mm   5 w   3  d

Subchorionic hemorrhage:  None visualized.

Maternal uterus/adnexae: Bilateral ovaries are within normal limits,
noting a right corpus luteal cyst.

No free fluid.
IMPRESSION: Single intrauterine gestational sac with yolk sac, measuring 5 weeks
3 days by mean sac diameter.

No fetal pole is visualized, likely related to early gestational
age. Consider follow-up pelvic ultrasound in 14 days as clinically
warranted.

## 2017-09-27 ENCOUNTER — Inpatient Hospital Stay (HOSPITAL_COMMUNITY)
Admission: AD | Admit: 2017-09-27 | Discharge: 2017-09-27 | Disposition: A | Discharge status: Home / Self Care | Payer: Self-pay | Source: Ambulatory Visit | Attending: Obstetrics and Gynecology | Admitting: Obstetrics and Gynecology

## 2017-09-27 ENCOUNTER — Encounter: Payer: Self-pay | Admitting: Student

## 2017-09-27 ENCOUNTER — Inpatient Hospital Stay (HOSPITAL_COMMUNITY): Payer: Self-pay

## 2017-09-27 DIAGNOSIS — Z79899 Other long term (current) drug therapy: Secondary | ICD-10-CM | POA: Insufficient documentation

## 2017-09-27 DIAGNOSIS — Z3491 Encounter for supervision of normal pregnancy, unspecified, first trimester: Secondary | ICD-10-CM

## 2017-09-27 DIAGNOSIS — O10911 Unspecified pre-existing hypertension complicating pregnancy, first trimester: Secondary | ICD-10-CM | POA: Insufficient documentation

## 2017-09-27 DIAGNOSIS — R1031 Right lower quadrant pain: Secondary | ICD-10-CM | POA: Insufficient documentation

## 2017-09-27 DIAGNOSIS — O219 Vomiting of pregnancy, unspecified: Secondary | ICD-10-CM | POA: Insufficient documentation

## 2017-09-27 DIAGNOSIS — F17210 Nicotine dependence, cigarettes, uncomplicated: Secondary | ICD-10-CM | POA: Insufficient documentation

## 2017-09-27 DIAGNOSIS — O26891 Other specified pregnancy related conditions, first trimester: Secondary | ICD-10-CM | POA: Insufficient documentation

## 2017-09-27 DIAGNOSIS — O99281 Endocrine, nutritional and metabolic diseases complicating pregnancy, first trimester: Secondary | ICD-10-CM | POA: Insufficient documentation

## 2017-09-27 DIAGNOSIS — Z8249 Family history of ischemic heart disease and other diseases of the circulatory system: Secondary | ICD-10-CM | POA: Insufficient documentation

## 2017-09-27 DIAGNOSIS — R1032 Left lower quadrant pain: Secondary | ICD-10-CM | POA: Insufficient documentation

## 2017-09-27 DIAGNOSIS — O9989 Other specified diseases and conditions complicating pregnancy, childbirth and the puerperium: Secondary | ICD-10-CM

## 2017-09-27 DIAGNOSIS — O99331 Smoking (tobacco) complicating pregnancy, first trimester: Secondary | ICD-10-CM | POA: Insufficient documentation

## 2017-09-27 DIAGNOSIS — O26899 Other specified pregnancy related conditions, unspecified trimester: Secondary | ICD-10-CM

## 2017-09-27 DIAGNOSIS — Z3A01 Less than 8 weeks gestation of pregnancy: Secondary | ICD-10-CM | POA: Insufficient documentation

## 2017-09-27 DIAGNOSIS — E039 Hypothyroidism, unspecified: Secondary | ICD-10-CM | POA: Insufficient documentation

## 2017-09-27 DIAGNOSIS — R109 Unspecified abdominal pain: Secondary | ICD-10-CM

## 2017-09-27 LAB — CBC
HEMATOCRIT: 31.8 % — AB (ref 36.0–46.0)
HEMOGLOBIN: 10.5 g/dL — AB (ref 12.0–15.0)
MCH: 26.5 pg (ref 26.0–34.0)
MCHC: 33 g/dL (ref 30.0–36.0)
MCV: 80.3 fL (ref 78.0–100.0)
Platelets: 289 10*3/uL (ref 150–400)
RBC: 3.96 MIL/uL (ref 3.87–5.11)
RDW: 15.9 % — ABNORMAL HIGH (ref 11.5–15.5)
WBC: 7 10*3/uL (ref 4.0–10.5)

## 2017-09-27 LAB — URINALYSIS, ROUTINE W REFLEX MICROSCOPIC
BACTERIA UA: NONE SEEN
Bilirubin Urine: NEGATIVE
Glucose, UA: NEGATIVE mg/dL
Hgb urine dipstick: NEGATIVE
Ketones, ur: NEGATIVE mg/dL
Nitrite: NEGATIVE
PH: 7 (ref 5.0–8.0)
Protein, ur: 30 mg/dL — AB
SPECIFIC GRAVITY, URINE: 1.019 (ref 1.005–1.030)

## 2017-09-27 LAB — POCT PREGNANCY, URINE: PREG TEST UR: POSITIVE — AB

## 2017-09-27 LAB — HCG, QUANTITATIVE, PREGNANCY: hCG, Beta Chain, Quant, S: 45578 m[IU]/mL — ABNORMAL HIGH (ref <5)

## 2017-09-27 MED ORDER — METOCLOPRAMIDE HCL 10 MG PO TABS: 10.0000 mg | ORAL_TABLET | Freq: Four times a day (QID) | ORAL | 0 refills | Status: DC

## 2017-09-27 NOTE — MAU Provider Note (Signed)
History     CSN: [id number]  Arrival date and time: 09/27/17 7672   First Provider Initiated Contact with Patient 09/27/17 1955      Chief Complaint  Patient presents with  . Abdominal Pain   HPI Kylie Hicks is a 31 y.o. [id number] at [redacted]w[redacted]d who presents with abdominal pain. 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 Reports intermittent bilateral lower abdominal pain that she describes as sore & cramp like. Rates pain 5/10. Has not treated. Endorses nausea. Denies vomiting, diarrhea, constipation, vaginal bleeding, vaginal discharge, or dysuria.  OB History    Gravida Para Term Preterm AB Living   11 8 7 1 2 6    SAB TAB Ectopic Multiple Live Births   1 1 0 0 6      Past Medical History:  Diagnosis Date  . Anemia    with pregnancies  . Chlamydia   . History of preterm labor, current pregnancy   . Hypertension   . Hypothyroidism    No meds  . Incompetent cervix    cerclage  . Postpartum depression 2006    Past Surgical History:  Procedure Laterality Date  . CERVICAL CERCLAGE    . CERVICAL CERCLAGE N/A 02/18/2015   Procedure: CERCLAGE CERVICAL;  Surgeon: Woodroe Mode, MD;  Location: [facility];  Service: Gynecology;  Laterality: N/A;  . PILONIDAL CYST / SINUS EXCISION      Family History  Problem Relation Age of Onset  . Hypertension Mother   . Cancer Sister     Social History  Substance Use Topics  . Smoking status: Current Every Day Smoker    Packs/day: 0.25    Years: 3.00    Types: Cigarettes  . Smokeless tobacco: Never Used  . Alcohol use No    Allergies: No Known Allergies  Prescriptions Prior to Admission  Medication Sig Dispense Refill Last Dose  . lisinopril-hydrochlorothiazide (PRINZIDE,ZESTORETIC) 20-25 MG tablet Take 1 tablet by mouth daily.   Past Week at Unknown time  . ferrous sulfate (FERROUSUL) 325 (65 FE) MG tablet Take 1 tablet (325 mg total) by mouth 2 (two) times daily. 60 tablet 1   . labetalol (NORMODYNE) 200 MG  tablet Take 1 tablet (200 mg total) by mouth 2 (two) times daily. 60 tablet 2   . metroNIDAZOLE (FLAGYL) 500 MG tablet Take 1 tablet (500 mg total) by mouth 2 (two) times daily. 14 tablet 0   . promethazine (PHENERGAN) 12.5 MG tablet Take 1 tablet (12.5 mg total) by mouth every 6 (six) hours as needed for nausea or vomiting. 30 tablet 0 08/17/2016 at Unknown time    Review of Systems  Constitutional: Negative.   Gastrointestinal: Positive for abdominal pain and nausea. Negative for constipation, diarrhea and vomiting.  Genitourinary: Negative.    Physical Exam   Blood pressure 135/79, pulse 80, temperature 99.1 F (37.3 C), temperature source Oral, resp. rate 18, height 5\' 3"  (1.6 m), weight 203 lb (92.1 kg), last menstrual period 08/11/2017, unknown if currently breastfeeding.  Physical Exam  Nursing note and vitals reviewed. Constitutional: She is oriented to person, place, and time. She appears well-developed and well-nourished. No distress.  HENT:  Head: Normocephalic and atraumatic.  Eyes: Conjunctivae are normal. Right eye exhibits no discharge. Left eye exhibits no discharge. No scleral icterus.  Neck: Normal range of motion.  Respiratory: Effort normal. No respiratory distress.  GI: Soft. She exhibits no distension. There is no tenderness. There is no rebound and no guarding.  Neurological: She is alert and oriented to person, place, and time.  Skin: Skin is warm and dry. She is not diaphoretic.  Psychiatric: She has a normal mood and affect. Her behavior is normal. Judgment and thought content normal.    MAU Course  Procedures Results for orders placed or performed during the hospital encounter of 09/27/17 (from the past 24 hour(s))  Urinalysis, Routine w reflex microscopic     Status: Abnormal   Collection Time: 09/27/17  7:16 PM  Result Value Ref Range   Color, Urine YELLOW YELLOW   APPearance CLEAR CLEAR   Specific Gravity, Urine 1.019 1.005 - 1.030   pH 7.0 5.0 - 8.0    Glucose, UA NEGATIVE NEGATIVE mg/dL   Hgb urine dipstick NEGATIVE NEGATIVE   Bilirubin Urine NEGATIVE NEGATIVE   Ketones, ur NEGATIVE NEGATIVE mg/dL   Protein, ur 30 (A) NEGATIVE mg/dL   Nitrite NEGATIVE NEGATIVE   Leukocytes, UA TRACE (A) NEGATIVE   RBC / HPF 0-5 0 - 5 RBC/hpf   WBC, UA 0-5 0 - 5 WBC/hpf   Bacteria, UA NONE SEEN NONE SEEN   Squamous Epithelial / LPF 0-5 (A) NONE SEEN   Mucus PRESENT   Pregnancy, urine POC     Status: Abnormal   Collection Time: 09/27/17  7:33 PM  Result Value Ref Range   Preg Test, Ur POSITIVE (A) NEGATIVE  CBC     Status: Abnormal   Collection Time: 09/27/17  7:46 PM  Result Value Ref Range   WBC 7.0 4.0 - 10.5 K/uL   RBC 3.96 3.87 - 5.11 MIL/uL   Hemoglobin 10.5 (L) 12.0 - 15.0 g/dL   HCT 31.8 (L) 36.0 - 46.0 %   MCV 80.3 78.0 - 100.0 fL   MCH 26.5 26.0 - 34.0 pg   MCHC 33.0 30.0 - 36.0 g/dL   RDW 15.9 (H) 11.5 - 15.5 %   Platelets 289 150 - 400 K/uL  hCG, quantitative, pregnancy     Status: Abnormal   Collection Time: 09/27/17  7:46 PM  Result Value Ref Range   hCG, Beta Chain, Quant, S 45,578 (H) <5 mIU/mL   US Ob Comp Less 14 Wks  Result Date: 09/27/2017 CLINICAL DATA:  Acute abdominal pain. EXAM: OBSTETRIC <14 WK [place] AND TRANSVAGINAL OB US TECHNIQUE: Both transabdominal and transvaginal ultrasound examinations were performed for complete evaluation of the gestation as well as the maternal uterus, adnexal regions, and pelvic cul-de-sac. Transvaginal technique was performed to assess early pregnancy. COMPARISON:  None. FINDINGS: Intrauterine gestational sac: Single Yolk sac:  Visible Embryo:  Not visible Cardiac Activity: Not visible Heart Rate:   bpm MSD: 17.6  mm   6 w   4  d CRL:    mm    w    d                  [place] EDC: Subchorionic hemorrhage:  None. Maternal uterus/adnexae: Both ovaries are unremarkable. No abnormal pelvic fluid collections IMPRESSION: There is an intrauterine gestational sac with visible yolk sac, but no visible fetal  pole or cardiac activity. Recommend follow-up quantitative B-HCG levels and follow-up US in 14 days to assess viability. This recommendation follows SRU consensus guidelines: Diagnostic Criteria for Nonviable Pregnancy Early in the First Trimester. [place] [employer] Med 2013; 742:5956-38. Electronically Signed   By: Andreas Newport M.D.   On: 09/27/2017 20:30   US Ob Transvaginal  Result Date: 09/27/2017 CLINICAL DATA:  Acute abdominal pain. EXAM: OBSTETRIC <  14 WK [place] AND TRANSVAGINAL OB US TECHNIQUE: Both transabdominal and transvaginal ultrasound examinations were performed for complete evaluation of the gestation as well as the maternal uterus, adnexal regions, and pelvic cul-de-sac. Transvaginal technique was performed to assess early pregnancy. COMPARISON:  None. FINDINGS: Intrauterine gestational sac: Single Yolk sac:  Visible Embryo:  Not visible Cardiac Activity: Not visible Heart Rate:   bpm MSD: 17.6  mm   6 w   4  d CRL:    mm    w    d                  [place] EDC: Subchorionic hemorrhage:  None. Maternal uterus/adnexae: Both ovaries are unremarkable. No abnormal pelvic fluid collections IMPRESSION: There is an intrauterine gestational sac with visible yolk sac, but no visible fetal pole or cardiac activity. Recommend follow-up quantitative B-HCG levels and follow-up US in 14 days to assess viability. This recommendation follows SRU consensus guidelines: Diagnostic Criteria for Nonviable Pregnancy Early in the First Trimester. [employer] Med 2013; 132:4401-02. Electronically Signed   By: Andreas Newport M.D.   On: 09/27/2017 20:30     MDM +UPT UA, wet prep, GC/chlamydia, CBC, ABO/Rh, quant hCG, HIV, and [place] today to rule out ectopic pregnancy O positive Ultrasound shows IUGS with yolk sac  Assessment and Plan  A: 1. Less than [redacted] weeks gestation of pregnancy   2. Abdominal pain affecting pregnancy   3. Normal IUP (intrauterine pregnancy) on prenatal ultrasound, first trimester   4. Nausea and  vomiting during pregnancy prior to [redacted] weeks gestation    P: Discharge home Rx reglan Outpatient ultrasound for viability ordered Discussed reasons to return to MAU Patient plans on going to [place] for [place] 09/27/2017, 7:55 PM

## 2017-09-27 NOTE — Discharge Instructions (Signed)
Morning Sickness °Morning sickness is when you feel sick to your stomach (nauseous) during pregnancy. This nauseous feeling may or may not come with vomiting. It often occurs in the morning but can be a problem any time of day. Morning sickness is most common during the first trimester, but it may continue throughout pregnancy. While morning sickness is unpleasant, it is usually harmless unless you develop severe and continual vomiting (hyperemesis gravidarum). This condition requires more intense treatment. °What are the causes? °The cause of morning sickness is not completely known but seems to be related to normal hormonal changes that occur in pregnancy. °What increases the risk? °You are at greater risk if you: °· Experienced nausea or vomiting before your pregnancy. °· Had morning sickness during a previous pregnancy. °· Are pregnant with more than one baby, such as twins. ° °How is this treated? °Do not use any medicines (prescription, over-the-counter, or herbal) for morning sickness without first talking to your health care provider. Your health care provider may prescribe or recommend: °· Vitamin B6 supplements. °· Anti-nausea medicines. °· The herbal medicine ginger. ° °Follow these instructions at home: °· Only take over-the-counter or prescription medicines as directed by your health care provider. °· Taking multivitamins before getting pregnant can prevent or decrease the severity of morning sickness in most women. °· Eat a piece of dry toast or unsalted crackers before getting out of bed in the morning. °· Eat five or six small meals a day. °· Eat dry and bland foods (rice, baked potato). Foods high in carbohydrates are often helpful. °· Do not drink liquids with your meals. Drink liquids between meals. °· Avoid greasy, fatty, and spicy foods. °· Get someone to cook for you if the smell of any food causes nausea and vomiting. °· If you feel nauseous after taking prenatal vitamins, take the vitamins at  night or with a snack. °· Snack on protein foods (nuts, yogurt, cheese) between meals if you are hungry. °· Eat unsweetened gelatins for desserts. °· Wearing an acupressure wristband (worn for sea sickness) may be helpful. °· Acupuncture may be helpful. °· Do not smoke. °· Get a humidifier to keep the air in your house free of odors. °· Get plenty of fresh air. °Contact a health care provider if: °· Your home remedies are not working, and you need medicine. °· You feel dizzy or lightheaded. °· You are losing weight. °Get help right away if: °· You have persistent and uncontrolled nausea and vomiting. °· You pass out (faint). °This information is not intended to replace advice given to you by your health care provider. Make sure you discuss any questions you have with your health care provider. °Document Released: 01/24/2007 Document Revised: 05/10/2016 Document Reviewed: 05/20/2013 °Elsevier Interactive Patient Education © 2017 [employer]. ° °

## 2017-09-27 NOTE — MAU Note (Signed)
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 Denies bleeding or LOF. States last LMP is 08/11/2017.

## 2017-10-14 ENCOUNTER — Ambulatory Visit (HOSPITAL_COMMUNITY)
Admission: RE | Admit: 2017-10-14 | Discharge: 2017-10-14 | Disposition: A | Discharge status: Home / Self Care | Payer: Self-pay | Source: Ambulatory Visit | Attending: Student | Admitting: Student

## 2017-10-14 ENCOUNTER — Ambulatory Visit: Payer: Self-pay | Admitting: *Deleted

## 2017-10-14 VITALS — BP 130/78 | HR 56

## 2017-10-14 DIAGNOSIS — D259 Leiomyoma of uterus, unspecified: Secondary | ICD-10-CM | POA: Insufficient documentation

## 2017-10-14 DIAGNOSIS — O3680X0 Pregnancy with inconclusive fetal viability, not applicable or unspecified: Secondary | ICD-10-CM

## 2017-10-14 DIAGNOSIS — Z3490 Encounter for supervision of normal pregnancy, unspecified, unspecified trimester: Secondary | ICD-10-CM

## 2017-10-14 DIAGNOSIS — O26891 Other specified pregnancy related conditions, first trimester: Secondary | ICD-10-CM | POA: Insufficient documentation

## 2017-10-14 DIAGNOSIS — R001 Bradycardia, unspecified: Secondary | ICD-10-CM | POA: Insufficient documentation

## 2017-10-14 DIAGNOSIS — O26899 Other specified pregnancy related conditions, unspecified trimester: Secondary | ICD-10-CM

## 2017-10-14 DIAGNOSIS — Z3A01 Less than 8 weeks gestation of pregnancy: Secondary | ICD-10-CM

## 2017-10-14 DIAGNOSIS — O3411 Maternal care for benign tumor of corpus uteri, first trimester: Secondary | ICD-10-CM | POA: Insufficient documentation

## 2017-10-14 DIAGNOSIS — Z3491 Encounter for supervision of normal pregnancy, unspecified, first trimester: Secondary | ICD-10-CM

## 2017-10-14 DIAGNOSIS — Z712 Person consulting for explanation of examination or test findings: Secondary | ICD-10-CM

## 2017-10-14 DIAGNOSIS — R109 Unspecified abdominal pain: Secondary | ICD-10-CM | POA: Insufficient documentation

## 2017-10-14 DIAGNOSIS — O219 Vomiting of pregnancy, unspecified: Secondary | ICD-10-CM

## 2017-10-14 DIAGNOSIS — Q289 Congenital malformation of circulatory system, unspecified: Secondary | ICD-10-CM | POA: Insufficient documentation

## 2017-10-14 NOTE — Progress Notes (Signed)
[place] results from today reviewed with Dr. Kennon Rounds.  Pt was advised of results showing some progression of pregnancy since last [place], however there is presence of low fetal heart rate. It is unclear what this will mean for the pregnancy and recommendation is for repeat US in 2 weeks.  Pt reports having some mild menstrual-like cramping but denies significant pain or vaginal bleeding. She was advised to return for evaluation if heavy bleeding occurs or increase in abdominal/pelvic pain.  [place] scheduled for 11/12 @ 1545.  Pt voiced understanding of all information and instructions given and agreed to plan of care.

## 2017-10-15 NOTE — Progress Notes (Signed)
Patient seen and assessed by nursing staff.  Agree with documentation and plan.  

## 2017-10-24 ENCOUNTER — Encounter: Payer: Self-pay | Admitting: Obstetrics & Gynecology

## 2017-10-28 ENCOUNTER — Ambulatory Visit (HOSPITAL_COMMUNITY): Admission: RE | Admit: 2017-10-28 | Payer: Self-pay | Source: Ambulatory Visit

## 2017-11-05 ENCOUNTER — Inpatient Hospital Stay (HOSPITAL_COMMUNITY): Payer: Self-pay

## 2017-11-05 ENCOUNTER — Encounter: Payer: Self-pay | Admitting: General Practice

## 2017-11-05 ENCOUNTER — Inpatient Hospital Stay (HOSPITAL_COMMUNITY)
Admission: AD | Admit: 2017-11-05 | Discharge: 2017-11-05 | Disposition: A | Discharge status: Home / Self Care | Payer: Self-pay | Source: Ambulatory Visit | Attending: Obstetrics and Gynecology | Admitting: Obstetrics and Gynecology

## 2017-11-05 ENCOUNTER — Encounter (HOSPITAL_COMMUNITY): Payer: Self-pay | Admitting: *Deleted

## 2017-11-05 DIAGNOSIS — Z3A01 Less than 8 weeks gestation of pregnancy: Secondary | ICD-10-CM | POA: Insufficient documentation

## 2017-11-05 DIAGNOSIS — O99281 Endocrine, nutritional and metabolic diseases complicating pregnancy, first trimester: Secondary | ICD-10-CM | POA: Insufficient documentation

## 2017-11-05 DIAGNOSIS — O161 Unspecified maternal hypertension, first trimester: Secondary | ICD-10-CM | POA: Insufficient documentation

## 2017-11-05 DIAGNOSIS — O99331 Smoking (tobacco) complicating pregnancy, first trimester: Secondary | ICD-10-CM | POA: Insufficient documentation

## 2017-11-05 DIAGNOSIS — O021 Missed abortion: Secondary | ICD-10-CM | POA: Insufficient documentation

## 2017-11-05 HISTORY — DX: Cardiac murmur, unspecified: R01.1

## 2017-11-05 LAB — URINALYSIS, ROUTINE W REFLEX MICROSCOPIC
BILIRUBIN URINE: NEGATIVE
Glucose, UA: NEGATIVE mg/dL
KETONES UR: NEGATIVE mg/dL
NITRITE: NEGATIVE
Protein, ur: 100 mg/dL — AB
Specific Gravity, Urine: 1.023 (ref 1.005–1.030)
pH: 5 (ref 5.0–8.0)

## 2017-11-05 LAB — CBC
HEMATOCRIT: 35.8 % — AB (ref 36.0–46.0)
Hemoglobin: 11.6 g/dL — ABNORMAL LOW (ref 12.0–15.0)
MCH: 26.9 pg (ref 26.0–34.0)
MCHC: 32.4 g/dL (ref 30.0–36.0)
MCV: 82.9 fL (ref 78.0–100.0)
Platelets: 295 10*3/uL (ref 150–400)
RBC: 4.32 MIL/uL (ref 3.87–5.11)
RDW: 15.2 % (ref 11.5–15.5)
WBC: 3.7 10*3/uL — AB (ref 4.0–10.5)

## 2017-11-05 MED ORDER — PROMETHAZINE HCL 25 MG PO TABS: 12.5000 mg | ORAL_TABLET | Freq: Four times a day (QID) | ORAL | 0 refills | Status: DC | PRN

## 2017-11-05 MED ORDER — IBUPROFEN 800 MG PO TABS: 800.0000 mg | ORAL_TABLET | Freq: Three times a day (TID) | ORAL | 0 refills | Status: DC | PRN

## 2017-11-05 MED ORDER — HYDROCODONE-ACETAMINOPHEN 5-325 MG PO TABS: 2.0000 | ORAL_TABLET | ORAL | 0 refills | Status: DC | PRN

## 2017-11-05 MED ORDER — MISOPROSTOL 200 MCG PO TABS: 800.0000 ug | ORAL_TABLET | Freq: Once | ORAL | 0 refills | Status: DC

## 2017-11-05 NOTE — MAU Note (Signed)
Pt reports she began having lower abd cramping and vaginal bleeding this am. Also reports headaches off/on for the last 3 days.

## 2017-11-05 NOTE — MAU Provider Note (Signed)
History     CSN: [id number]  Arrival date and time: 11/05/17 7829   First Provider Initiated Contact with Patient 11/05/17 0930      Chief Complaint  Patient presents with  . Vaginal Bleeding   Vaginal Bleeding  The patient's primary symptoms include pelvic pain and vaginal bleeding. This is a new problem. The current episode started today. The problem occurs constantly. The problem has been unchanged. Pain severity now: 6/10. The problem affects both sides. She is pregnant. Pertinent negatives include no chills, constipation (last BM 11/04/17 ), diarrhea, dysuria, fever, frequency, nausea, urgency or vomiting. The vaginal discharge was brown and bloody. The vaginal bleeding is spotting. She has not been passing clots. She has not been passing tissue. Nothing aggravates the symptoms. She has tried nothing for the symptoms. Menstrual history: LMP 08/11/17     Past Medical History:  Diagnosis Date  . Anemia    with pregnancies  . Chlamydia   . Heart murmur   . History of preterm labor, current pregnancy   . Hypertension   . Hypothyroidism    No meds  . Incompetent cervix    cerclage  . Postpartum depression 2006    Past Surgical History:  Procedure Laterality Date  . CERCLAGE CERVICAL N/A 02/18/2015   Performed by Woodroe Mode, MD at [facility] ORS  . CERVICAL CERCLAGE    . PILONIDAL CYST / SINUS EXCISION      Family History  Problem Relation Age of Onset  . Hypertension Mother   . Cancer Sister     Social History   Tobacco Use  . Smoking status: Current Every Day Smoker    Packs/day: 0.25    Years: 3.00    Pack years: 0.75    Types: Cigarettes  . Smokeless tobacco: Never Used  Substance Use Topics  . Alcohol use: No    Alcohol/week: 0.0 oz  . Drug use: No    Allergies: No Known Allergies  Medications Prior to Admission  Medication Sig Dispense Refill Last Dose  . lisinopril-hydrochlorothiazide (PRINZIDE,ZESTORETIC) 20-25 MG tablet Take 1 tablet by mouth  daily.   Past Week at Unknown time  . metoCLOPramide (REGLAN) 10 MG tablet Take 1 tablet (10 mg total) by mouth every 6 (six) hours. 30 tablet 0     Review of Systems  Constitutional: Negative for chills and fever.  Gastrointestinal: Negative for constipation (last BM 11/04/17 ), diarrhea, nausea and vomiting.  Genitourinary: Positive for pelvic pain and vaginal bleeding. Negative for dysuria, frequency and urgency.   Physical Exam   Blood pressure (!) 132/91, pulse 87, temperature 98.4 F (36.9 C), temperature source Oral, resp. rate 15, height 5\' 3"  (1.6 m), weight 203 lb (92.1 kg), last menstrual period 08/11/2017, SpO2 100 %, unknown if currently breastfeeding.  Physical Exam  Nursing note and vitals reviewed. Constitutional: She is oriented to person, place, and time. She appears well-developed and well-nourished. No distress.  HENT:  Head: Normocephalic.  Cardiovascular: Normal rate.  Respiratory: Effort normal.  GI: Soft. There is no tenderness. There is no rebound.  Neurological: She is alert and oriented to person, place, and time.  Skin: Skin is warm and dry.  Psychiatric: She has a normal mood and affect.   Results for orders placed or performed during the hospital encounter of 11/05/17 (from the past 24 hour(s))  Urinalysis, Routine w reflex microscopic     Status: Abnormal   Collection Time: 11/05/17  8:40 AM  Result Value Ref Range  Color, Urine YELLOW YELLOW   APPearance HAZY (A) CLEAR   Specific Gravity, Urine 1.023 1.005 - 1.030   pH 5.0 5.0 - 8.0   Glucose, UA NEGATIVE NEGATIVE mg/dL   Hgb urine dipstick LARGE (A) NEGATIVE   Bilirubin Urine NEGATIVE NEGATIVE   Ketones, ur NEGATIVE NEGATIVE mg/dL   Protein, ur 100 (A) NEGATIVE mg/dL   Nitrite NEGATIVE NEGATIVE   Leukocytes, UA TRACE (A) NEGATIVE   RBC / HPF 6-30 0 - 5 RBC/hpf   WBC, UA 0-5 0 - 5 WBC/hpf   Bacteria, UA RARE (A) NONE SEEN   Squamous Epithelial / LPF 6-30 (A) NONE SEEN   Mucus PRESENT    CBC     Status: Abnormal   Collection Time: 11/05/17  9:47 AM  Result Value Ref Range   WBC 3.7 (L) 4.0 - 10.5 K/uL   RBC 4.32 3.87 - 5.11 MIL/uL   Hemoglobin 11.6 (L) 12.0 - 15.0 g/dL   HCT 35.8 (L) 36.0 - 46.0 %   MCV 82.9 78.0 - 100.0 fL   MCH 26.9 26.0 - 34.0 pg   MCHC 32.4 30.0 - 36.0 g/dL   RDW 15.2 11.5 - 15.5 %   Platelets 295 150 - 400 K/uL   US Ob Transvaginal  Result Date: 11/05/2017 CLINICAL DATA:  Pregnant, bleeding/cramping EXAM: TRANSVAGINAL OB ULTRASOUND TECHNIQUE: Transvaginal ultrasound was performed for complete evaluation of the gestation as well as the maternal uterus, adnexal regions, and pelvic cul-de-sac. COMPARISON:  10/14/2017 FINDINGS: Intrauterine gestational sac: Single, mildly irregular Yolk sac:  Visualized. Embryo:  Visualized. Cardiac Activity: Not Visualized. CRL:   4.1  mm   6 w 1 d Subchorionic hemorrhage:  None visualized. Maternal uterus/adnexae: Bilateral ovaries are within normal limits. Trace pelvic fluid. IMPRESSION: Single intrauterine gestation, measuring 6 weeks 1 day by crown-rump length, without cardiac activity. Given the prior study dated 10/14/2017, which demonstrated cardiac activity, findings meet definitive criteria for failed pregnancy. This follows SRU consensus guidelines: Diagnostic Criteria for Nonviable Pregnancy Early in the First Trimester. Alison Stalling J Med [phone]. These results will be called to the ordering clinician or representative by the Radiology Department at the imaging location. Electronically Signed   By: Julian Hy M.D.   On: 11/05/2017 10:58   MAU Course  Procedures  MDM Results discussed with the patient. Questions answered. She would like to do cytotec, but would like a RX so that she can take this medication at home. Detailed instructions given to patient, and questions answered.    Assessment and Plan   1. Missed abortion   2. [redacted] weeks gestation of pregnancy    DC home Comfort measures reviewed   Bleeding precautions RX: Cytotec as directed, ibuprofen 800mg  PRN, phenergan PRN, Vicodin PRN  Return to MAU as needed   [place] [facility] Follow up.   Specialty:  Obstetrics and Gynecology Why:  They will call you with a follow up appt for 2 weeks from now  Contact information: [place] 11/05/2017, 9:32 AM

## 2017-11-05 NOTE — Discharge Instructions (Signed)
Take 4 Misoprostol tablets, dissolve in cheeks.  If no bleeding or passing of tissue within 24-48 hours from first dose may take a second dose of 4 tablets Bleeding and cramping can become heavy and severe. Please take pain medication as needed for pain, and monitor bleeding.  If bleeding more than one pad in an hour that goes on for 3 or more hours please return to be seen  Otherwise we will see you for follow up in 2 weeks, the clinic will call you with an appointment.     Miscarriage A miscarriage is the loss of an unborn baby (fetus) before the 20th week of pregnancy. The cause is often unknown. Follow these instructions at home:  You may need to stay in bed (bed rest), or you may be able to do light activity. Go about activity as told by your doctor.  Have help at home.  Write down how many pads you use each day. Write down how soaked they are.  Do not use tampons. Do not wash out your vagina (douche) or have sex (intercourse) until your doctor approves.  Only take medicine as told by your doctor.  Do not take aspirin.  Keep all doctor visits as told.  If you or your partner have problems with grieving, talk to your doctor. You can also try counseling. Give yourself time to grieve before trying to get pregnant again. Get help right away if:  You have bad cramps or pain in your back or belly (abdomen).  You have a fever.  You pass large clumps of blood (clots) from your vagina that are walnut-sized or larger. Save the clumps for your doctor to see.  You pass large amounts of tissue from your vagina. Save the tissue for your doctor to see.  You have more bleeding.  You have thick, bad-smelling fluid (discharge) coming from the vagina.  You get lightheaded, weak, or you pass out (faint).  You have chills. This information is not intended to replace advice given to you by your health care provider. Make sure you discuss any questions you have with your health care  provider. Document Released: 02/25/2012 Document Revised: 05/10/2016 Document Reviewed: 01/03/2012 Elsevier Interactive Patient Education  2017 [employer].

## 2017-11-18 ENCOUNTER — Ambulatory Visit: Payer: Self-pay | Admitting: Advanced Practice Midwife

## 2018-09-02 ENCOUNTER — Encounter (HOSPITAL_COMMUNITY): Payer: Self-pay

## 2018-10-19 ENCOUNTER — Inpatient Hospital Stay (HOSPITAL_COMMUNITY)
Admission: AD | Admit: 2018-10-19 | Discharge: 2018-10-19 | Disposition: A | Discharge status: Home / Self Care | Payer: Self-pay | Source: Ambulatory Visit | Attending: Obstetrics and Gynecology | Admitting: Obstetrics and Gynecology

## 2018-10-19 ENCOUNTER — Encounter (HOSPITAL_COMMUNITY): Payer: Self-pay

## 2018-10-19 DIAGNOSIS — M79604 Pain in right leg: Secondary | ICD-10-CM

## 2018-10-19 DIAGNOSIS — F17210 Nicotine dependence, cigarettes, uncomplicated: Secondary | ICD-10-CM | POA: Insufficient documentation

## 2018-10-19 DIAGNOSIS — M79651 Pain in right thigh: Secondary | ICD-10-CM | POA: Insufficient documentation

## 2018-10-19 DIAGNOSIS — I10 Essential (primary) hypertension: Secondary | ICD-10-CM | POA: Insufficient documentation

## 2018-10-19 DIAGNOSIS — Z3202 Encounter for pregnancy test, result negative: Secondary | ICD-10-CM | POA: Insufficient documentation

## 2018-10-19 LAB — COMPREHENSIVE METABOLIC PANEL
ALK PHOS: 69 U/L (ref 38–126)
ALT: 15 U/L (ref 0–44)
ANION GAP: 8 (ref 5–15)
AST: 23 U/L (ref 15–41)
Albumin: 3.8 g/dL (ref 3.5–5.0)
BILIRUBIN TOTAL: 0.7 mg/dL (ref 0.3–1.2)
BUN: 13 mg/dL (ref 6–20)
CALCIUM: 8.8 mg/dL — AB (ref 8.9–10.3)
CO2: 24 mmol/L (ref 22–32)
Chloride: 106 mmol/L (ref 98–111)
Creatinine, Ser: 0.93 mg/dL (ref 0.44–1.00)
GFR calc non Af Amer: >60 mL/min (ref >60)
Glucose, Bld: 99 mg/dL (ref 70–99)
Potassium: 3.1 mmol/L — ABNORMAL LOW (ref 3.5–5.1)
SODIUM: 138 mmol/L (ref 135–145)
TOTAL PROTEIN: 7 g/dL (ref 6.5–8.1)

## 2018-10-19 LAB — URINALYSIS, ROUTINE W REFLEX MICROSCOPIC
BILIRUBIN URINE: NEGATIVE
Bacteria, UA: NONE SEEN
GLUCOSE, UA: NEGATIVE mg/dL
Hgb urine dipstick: NEGATIVE
KETONES UR: NEGATIVE mg/dL
Nitrite: NEGATIVE
PH: 6 (ref 5.0–8.0)
PROTEIN: NEGATIVE mg/dL
Specific Gravity, Urine: 1.02 (ref 1.005–1.030)

## 2018-10-19 LAB — POCT PREGNANCY, URINE: Preg Test, Ur: NEGATIVE

## 2018-10-19 MED ORDER — HYDROCHLOROTHIAZIDE 25 MG PO TABS: 25.0000 mg | ORAL_TABLET | Freq: Every day | ORAL | 3 refills | Status: AC

## 2018-10-19 MED ORDER — KETOROLAC TROMETHAMINE 10 MG PO TABS
10.0000 mg | ORAL_TABLET | Freq: Once | ORAL | Status: AC
  Administered 2018-10-19: 10 mg via ORAL
  Filled 2018-10-19: qty 1

## 2018-10-19 MED ORDER — KETOROLAC TROMETHAMINE 10 MG PO TABS: 10.0000 mg | ORAL_TABLET | Freq: Four times a day (QID) | ORAL | 0 refills | Status: DC | PRN

## 2018-10-19 MED ORDER — ACETAMINOPHEN 500 MG PO TABS: 1000.0000 mg | ORAL_TABLET | Freq: Four times a day (QID) | ORAL | 0 refills | Status: AC | PRN
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
## 2018-10-22 ENCOUNTER — Encounter (HOSPITAL_COMMUNITY): Payer: Self-pay

## 2019-07-11 ENCOUNTER — Emergency Department (HOSPITAL_COMMUNITY)
Admission: EM | Admit: 2019-07-11 | Discharge: 2019-07-11 | Disposition: A | Discharge status: Home / Self Care | Payer: Self-pay | Attending: Emergency Medicine | Admitting: Emergency Medicine

## 2019-07-11 ENCOUNTER — Encounter (HOSPITAL_COMMUNITY): Payer: Self-pay

## 2019-07-11 ENCOUNTER — Emergency Department (HOSPITAL_COMMUNITY): Payer: Self-pay

## 2019-07-11 ENCOUNTER — Other Ambulatory Visit: Payer: Self-pay

## 2019-07-11 DIAGNOSIS — E039 Hypothyroidism, unspecified: Secondary | ICD-10-CM | POA: Insufficient documentation

## 2019-07-11 DIAGNOSIS — M791 Myalgia, unspecified site: Secondary | ICD-10-CM

## 2019-07-11 DIAGNOSIS — F17210 Nicotine dependence, cigarettes, uncomplicated: Secondary | ICD-10-CM | POA: Insufficient documentation

## 2019-07-11 DIAGNOSIS — Z79899 Other long term (current) drug therapy: Secondary | ICD-10-CM | POA: Insufficient documentation

## 2019-07-11 DIAGNOSIS — U071 COVID-19: Secondary | ICD-10-CM

## 2019-07-11 DIAGNOSIS — R110 Nausea: Secondary | ICD-10-CM

## 2019-07-11 DIAGNOSIS — I10 Essential (primary) hypertension: Secondary | ICD-10-CM | POA: Insufficient documentation

## 2019-07-11 MED ORDER — ONDANSETRON 4 MG PO TBDP: ORAL_TABLET | ORAL | 0 refills | Status: AC

## 2019-07-11 MED ORDER — ALBUTEROL SULFATE HFA 108 (90 BASE) MCG/ACT IN AERS
2.0000 | INHALATION_SPRAY | Freq: Once | RESPIRATORY_TRACT | Status: AC
  Administered 2019-07-11: 2 via RESPIRATORY_TRACT
  Filled 2019-07-11: qty 6.7

## 2019-07-11 MED ORDER — ACETAMINOPHEN 500 MG PO TABS
1000.0000 mg | ORAL_TABLET | Freq: Once | ORAL | Status: AC
  Administered 2019-07-11: 1000 mg via ORAL
  Filled 2019-07-11: qty 2

## 2019-07-11 MED ORDER — ONDANSETRON 4 MG PO TBDP
4.0000 mg | ORAL_TABLET | Freq: Once | ORAL | Status: AC
  Administered 2019-07-11: 4 mg via ORAL
  Filled 2019-07-11: qty 1

## 2019-07-11 NOTE — ED Triage Notes (Signed)
Pt arrives POV for eval of body aches and generalized malaise w/ nausea after being dx'd w/ Covid via pos. Test. States she was dx'd on 7/20

## 2019-07-11 NOTE — Discharge Instructions (Signed)
You have tested positive for COVID-19 virus.  Please continue to quarantine at home and monitor your symptoms closely. You chest x-ray was clear. Antibiotics are not helpful in treating viral infection, the virus should run its course in about 10-14 days. Please make sure you are drinking plenty of fluids. You can treat your symptoms supportively with tylenol for fevers and pains, and over the counter cough syrups and throat lozenges to help with cough. If your symptoms are not improving please follow up with you Primary doctor.   I recommend that you purchase a home pulse ox to help better monitor your oxygen at home, if you start to have increased work of breathing or shortness of breath or your oxygen drops below 90% please immediately return to the hospital for reevaluation.  If you develop persistent fevers, shortness of breath or difficulty breathing, chest pain, severe headache and neck pain, persistent nausea and vomiting or other new or concerning symptoms return to the Emergency department.

## 2019-07-11 NOTE — ED Provider Notes (Signed)
[facility] EMER[facility]ENC[facility] DEPARTMENT Provider Note   CSN: [id number] Arrival date & time: 07/11/19  1933    History   Chief Complaint Chief Complaint  Patient presents with  . Generalized Body Aches  . COVID POSITIVE    HPI Kylie Hicks is a 33 y.o. female.     Kylie Hicks is a 33 y.o. female with a history of heart murmur, hypertension, hypothyroidism and anemia, who presents to the emergency department for evaluation of body aches, generalized fatigue and malaise and nausea.  Patient reports that she tested positive for COVID-19 virus on 7/20, she reports at this time she was not experiencing any symptoms what was tested due to a work exposure, patient is a [employer].  She reports that 3 days ago she started experiencing the symptoms she has had low-grade fevers at home generalized myalgias, she reports despite resting she has felt extremely tired and she has been nauseated but has not had any vomiting.  She denies abdominal pain or diarrhea.  She has had some intermittent headaches.  She has had some chest tightness and occasional cough.  She reports that although she has generalized myalgias she has noted a pain in her mid back that seems to be worse with deep breath and with cough.  She has been taking Tylenol without much relief in her symptoms has not tried anything else prior to arrival.  No other aggravating or alleviating factors.     Past Medical History:  Diagnosis Date  . Anemia    with pregnancies  . Chlamydia   . Heart murmur   . History of preterm labor, current pregnancy   . Hypertension   . Hypothyroidism    No meds  . Incompetent cervix    cerclage  . Postpartum depression 2006    Patient Active Problem List   Diagnosis Date Noted  . Mild aortic stenosis 04/29/2015  . Palpitations 03/03/2015  . Marijuana use 12/21/2014  . History of postpartum depression 09/10/2011  . Hypertension     Past Surgical History:  Procedure Laterality  Date  . CERVICAL CERCLAGE    . CERVICAL CERCLAGE N/A 02/18/2015   Procedure: CERCLAGE CERVICAL;  Surgeon: Woodroe Mode, MD;  Location: [facility];  Service: Gynecology;  Laterality: N/A;  . PILONIDAL CYST / SINUS EXCISION       OB History    Gravida  11   Para  8   Term  7   Preterm  1   AB  2   Living  6     SAB  1   TAB  1   Ectopic  0   Multiple  0   Live Births  6            Home Medications    Prior to Admission medications   Medication Sig Start Date End Date Taking? Authorizing Provider  acetaminophen (TYLENOL) 500 MG tablet Take 2 tablets (1,000 mg total) by mouth every 6 (six) hours as needed for moderate pain. 10/19/18   Tamala Julian, [place], CNM  hydrochlorothiazide (HYDRODIURIL) 25 MG tablet Take 1 tablet (25 mg total) by mouth daily. 10/19/18   Tamala Julian, [place], CNM  ketorolac (TORADOL) 10 MG tablet Take 1 tablet (10 mg total) by mouth every 6 (six) hours as needed. 10/19/18   Manya Silvas, CNM    Family History Family History  Problem Relation Age of Onset  . Hypertension Mother   . Cancer Sister     Social  History Social History   Tobacco Use  . Smoking status: Current Every Day Smoker    Packs/day: 0.25    Years: 3.00    Pack years: 0.75    Types: Cigarettes  . Smokeless tobacco: Never Used  Substance Use Topics  . Alcohol use: No    Alcohol/week: 0.0 standard drinks  . Drug use: Not on file     Allergies   Patient has no known allergies.   Review of Systems Review of Systems  Constitutional: Positive for chills. Negative for fever.  HENT: Negative for congestion and rhinorrhea.   Respiratory: Positive for cough and chest tightness. Negative for shortness of breath.   Cardiovascular: Negative for chest pain and leg swelling.  Gastrointestinal: Positive for nausea. Negative for abdominal pain, constipation, diarrhea and vomiting.  Genitourinary: Negative for dysuria.  Musculoskeletal: Positive for back pain and myalgias.  Skin:  Negative for color change and rash.  Neurological: Positive for headaches. Negative for dizziness, syncope and light-headedness.     Physical Exam Updated Vital Signs BP (!) 152/108 (BP Location: Left Arm)   Pulse 84   Temp 99.7 F (37.6 C) (Oral)   Resp 16   Ht 5\' 3"  (1.6 m)   Wt 88.5 kg   SpO2 99%   BMI 34.54 kg/m   Physical Exam Vitals signs and nursing note reviewed.  Constitutional:      General: She is not in acute distress.    Appearance: Normal appearance. She is well-developed and normal weight. She is not ill-appearing or diaphoretic.  HENT:     Head: Normocephalic and atraumatic.     Mouth/Throat:     Mouth: Mucous membranes are moist.     Pharynx: Oropharynx is clear.  Eyes:     General:        Right eye: No discharge.        Left eye: No discharge.     Pupils: Pupils are equal, round, and reactive to light.  Neck:     Musculoskeletal: Neck supple.  Cardiovascular:     Rate and Rhythm: Normal rate and regular rhythm.     Pulses: Normal pulses.     Heart sounds: Normal heart sounds. No murmur. No friction rub. No gallop.   Pulmonary:     Effort: Pulmonary effort is normal. No respiratory distress.     Breath sounds: Normal breath sounds. No wheezing or rales.     Comments: Respirations equal and unlabored, patient able to speak in full sentences, lungs clear to auscultation bilaterally Abdominal:     General: Bowel sounds are normal. There is no distension.     Palpations: Abdomen is soft. There is no mass.     Tenderness: There is no abdominal tenderness. There is no guarding.     Comments: Abdomen soft, nondistended, nontender to palpation in all quadrants without guarding or peritoneal signs  Musculoskeletal:        General: No deformity.     Right lower leg: No edema.     Left lower leg: No edema.     Comments: Back nontender to palpation. Bilateral lower extremities warm and well perfused without edema.  Skin:    General: Skin is warm and dry.      Capillary Refill: Capillary refill takes less than 2 seconds.  Neurological:     Mental Status: She is alert.     Coordination: Coordination normal.     Comments: Speech is clear, able to follow commands Moves extremities without ataxia,  coordination intact  Psychiatric:        Mood and Affect: Mood normal.        Behavior: Behavior normal.      ED Treatments / Results  Labs (all labs ordered are listed, but only abnormal results are displayed) Labs Reviewed - No data to display  EKG None  Radiology Dg Chest [facility] 1 View  Result Date: 07/11/2019 CLINICAL DATA:  COVID-19 EXAM: PORTABLE CHEST 1 VIEW COMPARISON:  None. FINDINGS: Cardiomegaly. Both lungs are clear. The visualized skeletal structures are unremarkable. IMPRESSION: Cardiomegaly without acute abnormality of the lungs in AP portable projection. Electronically Signed   By: Eddie Candle M.D.   On: 07/11/2019 21:39    Procedures Procedures (including critical care time)  Medications Ordered in ED Medications  albuterol (VENTOLIN HFA) 108 (90 Base) MCG/ACT inhaler 2 puff (2 puffs Inhalation Given 07/11/19 2131)  acetaminophen (TYLENOL) tablet 1,000 mg (1,000 mg Oral Given 07/11/19 2131)  ondansetron (ZOFRAN-ODT) disintegrating tablet 4 mg (4 mg Oral Given 07/11/19 2131)     Initial Impression / Assessment and Plan / ED Course  I have reviewed the triage vital signs and the nursing notes.  Pertinent labs & imaging results that were available during my care of the patient were reviewed by me and considered in my medical decision making (see chart for details).  33 year old female with known coronavirus presenting with body aches, chills, and nausea.  Vitals normal on arrival.  Patient does report some pain in her mid back between her shoulder blades that is worse with cough and deep breath, I suspect all the symptoms are due to coronavirus, patient was treated with albuterol, Tylenol and Zofran with improvement in her  symptoms.  The pain in her mid back does raise some concern for possible PE, outside of coronavirus patient would be PERC negative as she exhibits no tachycardia or hypoxia and has no focal risk factors for PE, it is unclear yet if coronavirus increases your risk of a blood clot.  I discussed with Dr. Regenia Skeeter who recommended shared decision making conversation.  Patient's chest x-ray is clear and I discussed with her over the phone the possible risks and benefits of getting lab work and a CT Angio of the chest to assess for blood clot versus close monitoring of her symptoms at home.  After this discussion patient would like to continue to monitor her symptoms at home as her pain improved with medications given here in the ED.  I have discussed strict return precautions with the patient.  She does have a pulse oximeter at home that she can use to monitor her oxygen.  Discussed appropriate symptomatic treatment, will discharge with Zofran.  Patient expresses understanding and agreement with plan and return precautions.  Discharged home in good condition.  Final Clinical Impressions(s) / ED Diagnoses   Final diagnoses:  [id number] virus infection  Myalgia  Nausea    ED Discharge Orders         Ordered    ondansetron (ZOFRAN ODT) 4 MG disintegrating tablet     07/11/19 2235           Benedetto Goad [place], [place] 07/13/19 0139    Sherwood Gambler, MD 07/16/19 [phone]

## 2019-10-06 ENCOUNTER — Encounter (HOSPITAL_COMMUNITY): Payer: Self-pay

## 2019-10-06 ENCOUNTER — Emergency Department (HOSPITAL_COMMUNITY): Payer: Self-pay

## 2019-10-06 ENCOUNTER — Emergency Department (HOSPITAL_COMMUNITY)
Admission: EM | Admit: 2019-10-06 | Discharge: 2019-10-06 | Disposition: A | Discharge status: Home / Self Care | Payer: Self-pay | Attending: Emergency Medicine | Admitting: Emergency Medicine

## 2019-10-06 ENCOUNTER — Other Ambulatory Visit: Payer: Self-pay

## 2019-10-06 DIAGNOSIS — E039 Hypothyroidism, unspecified: Secondary | ICD-10-CM | POA: Insufficient documentation

## 2019-10-06 DIAGNOSIS — I10 Essential (primary) hypertension: Secondary | ICD-10-CM | POA: Insufficient documentation

## 2019-10-06 DIAGNOSIS — Z8709 Personal history of other diseases of the respiratory system: Secondary | ICD-10-CM | POA: Insufficient documentation

## 2019-10-06 DIAGNOSIS — R059 Cough, unspecified: Secondary | ICD-10-CM

## 2019-10-06 DIAGNOSIS — R05 Cough: Secondary | ICD-10-CM | POA: Insufficient documentation

## 2019-10-06 DIAGNOSIS — F17210 Nicotine dependence, cigarettes, uncomplicated: Secondary | ICD-10-CM | POA: Insufficient documentation

## 2019-10-06 DIAGNOSIS — Z760 Encounter for issue of repeat prescription: Secondary | ICD-10-CM | POA: Insufficient documentation

## 2019-10-06 MED ORDER — ALBUTEROL SULFATE HFA 108 (90 BASE) MCG/ACT IN AERS
1.0000 | INHALATION_SPRAY | Freq: Once | RESPIRATORY_TRACT | Status: AC
Start: 2019-10-06 — End: 2019-10-06
  Administered 2019-10-06: 1 via RESPIRATORY_TRACT
  Filled 2019-10-06: qty 6.7

## 2019-10-06 NOTE — ED Notes (Signed)
Discharge instructions discussed with Pt. Pt verbalized understanding. Pt stable and ambulatory.    
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
## 2019-10-06 NOTE — Discharge Instructions (Signed)
Use albuterol inhaler as needed.  Follow-up with your primary care provider if you have any new worsening symptoms.

## 2019-10-06 NOTE — ED Provider Notes (Signed)
[facility] EMERGENCY DEPARTMENT Provider Note   CSN: [id number] Arrival date & time: 10/06/19  1736     History   Chief Complaint Chief Complaint  Patient presents with  . Medication Refill    HPI Kylie Hicks is a 33 y.o. female with medical history significant for hypertension, anemia, chronic bronchitis, asthma who presents for evaluation of needing medication refill.  States she has been out of her albuterol inhaler.  States she uses this approximately once or twice a week.  Her asthma has gotten worse after she was diagnosed with Covid in July.  She has no current symptoms however is afraid that she does not her albuterol inhaler and she has a "attack" she will need it.  Patient requesting inhaler.  She has had a mild nonproductive cough however states is been chronic since she was diagnosed with Covid.  She denies fever, chills, nausea, vomiting, chest pain, shortness of breath, hemoptysis abdominal pain, diarrhea, dysuria.  Denies additional rating or alleviating factors.  Patient denies any prior hospitalizations or intubations.  History obtained from patient and past medical records.  No interpreter was used.     HPI  Past Medical History:  Diagnosis Date  . Anemia    with pregnancies  . Chlamydia   . Heart murmur   . History of preterm labor, current pregnancy   . Hypertension   . Hypothyroidism    No meds  . Incompetent cervix    cerclage  . Postpartum depression 2006    Patient Active Problem List   Diagnosis Date Noted  . Mild aortic stenosis 04/29/2015  . Palpitations 03/03/2015  . Marijuana use 12/21/2014  . History of postpartum depression 09/10/2011  . Hypertension     Past Surgical History:  Procedure Laterality Date  . CERVICAL CERCLAGE    . CERVICAL CERCLAGE N/A 02/18/2015   Procedure: CERCLAGE CERVICAL;  Surgeon: Woodroe Mode, MD;  Location: [facility];  Service: Gynecology;  Laterality: N/A;  . PILONIDAL CYST / SINUS  EXCISION       OB History    Gravida  11   Para  8   Term  7   Preterm  1   AB  2   Living  6     SAB  1   TAB  1   Ectopic  0   Multiple  0   Live Births  6            Home Medications    Prior to Admission medications   Medication Sig Start Date End Date Taking? Authorizing Provider  acetaminophen (TYLENOL) 500 MG tablet Take 2 tablets (1,000 mg total) by mouth every 6 (six) hours as needed for moderate pain. 10/19/18   Tamala Julian, [place], CNM  hydrochlorothiazide (HYDRODIURIL) 25 MG tablet Take 1 tablet (25 mg total) by mouth daily. 10/19/18   Tamala Julian, [place], CNM  ketorolac (TORADOL) 10 MG tablet Take 1 tablet (10 mg total) by mouth every 6 (six) hours as needed. 10/19/18   Tamala Julian, [place], CNM  ondansetron (ZOFRAN ODT) 4 MG disintegrating tablet 4mg  ODT q4 hours prn nausea/vomit 07/11/19   Jacqlyn Larsen, PA-C    Family History Family History  Problem Relation Age of Onset  . Hypertension Mother   . Cancer Sister     Social History Social History   Tobacco Use  . Smoking status: Current Every Day Smoker    Packs/day: 0.25    Years: 3.00    Pack  years: 0.75    Types: Cigarettes  . Smokeless tobacco: Never Used  Substance Use Topics  . Alcohol use: No    Alcohol/week: 0.0 standard drinks  . Drug use: Not on file     Allergies   Patient has no known allergies.   Review of Systems Review of Systems  Constitutional: Negative.   HENT: Negative.   Respiratory: Positive for cough (Chronic). Negative for apnea, choking, chest tightness, shortness of breath, wheezing and stridor.   Cardiovascular: Negative.   Gastrointestinal: Negative.   Genitourinary: Negative.   Musculoskeletal: Negative.   Skin: Negative.   Neurological: Negative.   All other systems reviewed and are negative.    Physical Exam Updated Vital Signs BP (!) 153/98 (BP Location: Right Arm)   Pulse 83   Temp 99 F (37.2 C) (Oral)   Resp 16   LMP 09/06/2019   SpO2 100%    Physical Exam Vitals signs and nursing note reviewed.  Constitutional:      General: She is not in acute distress.    Appearance: She is not ill-appearing, toxic-appearing or diaphoretic.  HENT:     Head: Normocephalic and atraumatic.     Jaw: There is normal jaw occlusion.     Right Ear: Tympanic membrane, ear canal and external ear normal. There is no impacted cerumen. No hemotympanum. Tympanic membrane is not injected, scarred, perforated, erythematous, retracted or bulging.     Left Ear: Tympanic membrane, ear canal and external ear normal. There is no impacted cerumen. No hemotympanum. Tympanic membrane is not injected, scarred, perforated, erythematous, retracted or bulging.     Ears:     Comments: No Mastoid tenderness.    Nose:     Comments: Clear rhinorrhea and congestion to bilateral nares.  No sinus tenderness.    Mouth/Throat:     Comments: Posterior oropharynx clear.  Mucous membranes moist.  Tonsils without erythema or exudate.  Uvula midline without deviation.  No evidence of PTA or RPA.  No drooling, dysphasia or trismus.  Phonation normal. Neck:     Trachea: Trachea and phonation normal.     Meningeal: Brudzinski's sign and Kernig's sign absent.     Comments: No Neck stiffness or neck rigidity.  No meningismus.  No cervical lymphadenopathy. Cardiovascular:     Comments: No murmurs rubs or gallops. Pulmonary:     Comments: Clear to auscultation bilaterally without wheeze, rhonchi or rales.  No accessory muscle usage.  Able speak in full sentences. Abdominal:     Comments: Soft, nontender without rebound or guarding.  No CVA tenderness.  Musculoskeletal:     Comments: Moves all 4 extremities without difficulty.  Lower extremities without edema, erythema or warmth.  Skin:    Comments: Brisk capillary refill.  No rashes or lesions.  Neurological:     Mental Status: She is alert.     Comments: Ambulatory in department without difficulty.  Cranial nerves II through XII  grossly intact.  No facial droop.  No aphasia.      ED Treatments / Results  Labs (all labs ordered are listed, but only abnormal results are displayed) Labs Reviewed - No data to display  EKG None  Radiology Dg Chest Portable 1 View  Result Date: 10/06/2019 CLINICAL DATA:  Cough.  History of bronchitis. EXAM: PORTABLE CHEST 1 VIEW COMPARISON:  07/11/2019 FINDINGS: Midline trachea. Borderline cardiomegaly. Mediastinal contours otherwise within normal limits. No pleural effusion or pneumothorax. Clear lungs. IMPRESSION: No acute cardiopulmonary disease. Electronically Signed  By: Abigail Miyamoto M.D.   On: 10/06/2019 20:41    Procedures Procedures (including critical care time)  Medications Ordered in ED Medications  albuterol (VENTOLIN HFA) 108 (90 Base) MCG/ACT inhaler 1 puff (has no administration in time range)     Initial Impression / Assessment and Plan / ED Course  I have reviewed the triage vital signs and the nursing notes.  Pertinent labs & imaging results that were available during my care of the patient were reviewed by me and considered in my medical decision making (see chart for details).   33 year old female appears otherwise well presents for evaluation of medication refill.  She has history of asthma, chronic bronchitis.  She has been out of her albuterol inhaler.  She denies any current symptoms on exam however does admit to a chronic cough which has not changed.  Was diagnosed with Covid in July however denies any chest pain, shortness of breath, hemoptysis.  Chest x-ray does not show dense of acute infiltrates, cardiomegaly, pulmonary edema, pneumothorax.  Heart and lungs clear.  She is able speak in full sentences without difficulty.  She is afebrile without tachycardia, tachypnea or hypoxia.  Will refill her albuterol inhaler.  Discussed return precautions.  Patient voiced understanding and is agreeable follow-up.  The patient has been appropriately medically  screened and/or stabilized in the ED. I have low suspicion for any other emergent medical condition which would require further screening, evaluation or treatment in the ED or require inpatient management.      AUBRIONNA HOLEMAN was evaluated in Emergency Department on 10/06/2019 for the symptoms described in the history of present illness. She was evaluated in the context of the global COVID-19 pandemic, which necessitated consideration that the patient might be at risk for infection with the SARS-CoV-2 virus that causes COVID-19. Institutional protocols and algorithms that pertain to the evaluation of patients at risk for COVID-19 are in a state of rapid change based on information released by regulatory bodies including the CDC and federal and state organizations. These policies and algorithms were followed during the patient's care in the ED.    Final Clinical Impressions(s) / ED Diagnoses   Final diagnoses:  Medication refill  Cough    ED Discharge Orders    None       Henderly, Britni A, PA-C 10/06/19 2057    Lennice Sites, DO 10/07/19 0126

## 2021-07-03 ENCOUNTER — Emergency Department (HOSPITAL_COMMUNITY): Payer: Self-pay

## 2021-07-03 ENCOUNTER — Encounter (HOSPITAL_COMMUNITY): Payer: Self-pay

## 2021-07-03 ENCOUNTER — Emergency Department (HOSPITAL_COMMUNITY)
Admission: EM | Admit: 2021-07-03 | Discharge: 2021-07-03 | Disposition: A | Discharge status: Home / Self Care | Payer: Self-pay | Attending: Emergency Medicine | Admitting: Emergency Medicine

## 2021-07-03 ENCOUNTER — Other Ambulatory Visit: Payer: Self-pay

## 2021-07-03 DIAGNOSIS — N898 Other specified noninflammatory disorders of vagina: Secondary | ICD-10-CM | POA: Insufficient documentation

## 2021-07-03 DIAGNOSIS — R0981 Nasal congestion: Secondary | ICD-10-CM | POA: Insufficient documentation

## 2021-07-03 DIAGNOSIS — R519 Headache, unspecified: Secondary | ICD-10-CM | POA: Insufficient documentation

## 2021-07-03 DIAGNOSIS — K5900 Constipation, unspecified: Secondary | ICD-10-CM | POA: Insufficient documentation

## 2021-07-03 DIAGNOSIS — R1084 Generalized abdominal pain: Secondary | ICD-10-CM | POA: Insufficient documentation

## 2021-07-03 DIAGNOSIS — R5383 Other fatigue: Secondary | ICD-10-CM | POA: Insufficient documentation

## 2021-07-03 DIAGNOSIS — E039 Hypothyroidism, unspecified: Secondary | ICD-10-CM | POA: Insufficient documentation

## 2021-07-03 DIAGNOSIS — M545 Low back pain, unspecified: Secondary | ICD-10-CM | POA: Insufficient documentation

## 2021-07-03 DIAGNOSIS — F17210 Nicotine dependence, cigarettes, uncomplicated: Secondary | ICD-10-CM | POA: Insufficient documentation

## 2021-07-03 DIAGNOSIS — I10 Essential (primary) hypertension: Secondary | ICD-10-CM | POA: Insufficient documentation

## 2021-07-03 DIAGNOSIS — N926 Irregular menstruation, unspecified: Secondary | ICD-10-CM | POA: Insufficient documentation

## 2021-07-03 DIAGNOSIS — R102 Pelvic and perineal pain: Secondary | ICD-10-CM

## 2021-07-03 LAB — COMPREHENSIVE METABOLIC PANEL
ALT: 16 U/L (ref 0–44)
AST: 20 U/L (ref 15–41)
Albumin: 3.8 g/dL (ref 3.5–5.0)
Alkaline Phosphatase: 96 U/L (ref 38–126)
Anion gap: 9 (ref 5–15)
BUN: 12 mg/dL (ref 6–20)
CO2: 23 mmol/L (ref 22–32)
Calcium: 9.1 mg/dL (ref 8.9–10.3)
Chloride: 107 mmol/L (ref 98–111)
Creatinine, Ser: 0.73 mg/dL (ref 0.44–1.00)
GFR, Estimated: >60 mL/min (ref >60)
Glucose, Bld: 91 mg/dL (ref 70–99)
Potassium: 3.5 mmol/L (ref 3.5–5.1)
Sodium: 139 mmol/L (ref 135–145)
Total Bilirubin: 0.4 mg/dL (ref 0.3–1.2)
Total Protein: 7.6 g/dL (ref 6.5–8.1)

## 2021-07-03 LAB — URINALYSIS, ROUTINE W REFLEX MICROSCOPIC
Bilirubin Urine: NEGATIVE
Glucose, UA: NEGATIVE mg/dL
Ketones, ur: NEGATIVE mg/dL
Leukocytes,Ua: NEGATIVE
Nitrite: NEGATIVE
Protein, ur: NEGATIVE mg/dL
Specific Gravity, Urine: 1.016 (ref 1.005–1.030)
pH: 5 (ref 5.0–8.0)

## 2021-07-03 LAB — LIPASE, BLOOD: Lipase: 24 U/L (ref 11–51)

## 2021-07-03 LAB — CBC
HCT: 37.2 % (ref 36.0–46.0)
Hemoglobin: 12 g/dL (ref 12.0–15.0)
MCH: 26.3 pg (ref 26.0–34.0)
MCHC: 32.3 g/dL (ref 30.0–36.0)
MCV: 81.6 fL (ref 80.0–100.0)
Platelets: 372 10*3/uL (ref 150–400)
RBC: 4.56 MIL/uL (ref 3.87–5.11)
RDW: 15.4 % (ref 11.5–15.5)
WBC: 7.1 10*3/uL (ref 4.0–10.5)
nRBC: 0 % (ref 0.0–0.2)

## 2021-07-03 LAB — WET PREP, GENITAL
Clue Cells Wet Prep HPF POC: NONE SEEN
Sperm: NONE SEEN
Trich, Wet Prep: NONE SEEN
Yeast Wet Prep HPF POC: NONE SEEN

## 2021-07-03 LAB — I-STAT BETA HCG BLOOD, ED (MC, WL, AP ONLY): I-stat hCG, quantitative: <5 m[IU]/mL (ref <5)

## 2021-07-03 NOTE — Discharge Instructions (Addendum)
You were seen in the emergency department for a late period, pelvic and back pain, some nasal congestion.  Your blood pressure was elevated here will need to be followed up with your primary care doctor.  You had lab work urinalysis pregnancy test and pelvic ultrasound that did not show any significant findings.  You can use ibuprofen and Tylenol for pain.  Return to the emergency department if any worsening or concerning symptoms

## 2021-07-03 NOTE — ED Notes (Signed)
Pelvic exam performed by MD and RN. Pt tolerated well

## 2021-07-03 NOTE — ED Triage Notes (Signed)
Patient states she missed her period this month. Last period was 05/27/21. Patient also c/o headache, nasal congestion, and lower back pain. Patient also c/o soreness of her lower abdomen x 3 days ago.

## 2021-07-03 NOTE — ED Provider Notes (Signed)
[facility] DEPT Provider Note   CSN: [id number] Arrival date & time: 07/03/21  8469     History Chief Complaint  Patient presents with   Abdominal Pain   Back Pain   Nasal Congestion    Kylie Hicks is a 35 y.o. female.She is about 1 week late on period. 1 week of low abd soreness into low back. 3 days of headache, nasal congestion, fatigue. Had 1 neg and 1 pos home test. Covid vaccinated. No fever or cough. Some constipation. Some white discharge.   The history is provided by the patient.  Abdominal Pain Pain location:  Suprapubic, LLQ and RLQ Pain quality: aching   Pain radiates to:  Back Pain severity:  Moderate Onset quality:  Gradual Duration:  1 week Timing:  Constant Progression:  Unchanged Chronicity:  New Relieved by:  None tried Worsened by:  Nothing Ineffective treatments:  None tried Associated symptoms: vaginal discharge   Associated symptoms: no chest pain, no cough, no dysuria, no fever, no shortness of breath and no sore throat       Past Medical History:  Diagnosis Date   Anemia    with pregnancies   Chlamydia    Heart murmur    History of preterm labor, current pregnancy    Hypertension    Hypothyroidism    No meds   Incompetent cervix    cerclage   Postpartum depression 2006    Patient Active Problem List   Diagnosis Date Noted   Mild aortic stenosis 04/29/2015   Palpitations 03/03/2015   Marijuana use 12/21/2014   History of postpartum depression 09/10/2011   Hypertension     Past Surgical History:  Procedure Laterality Date   CERVICAL CERCLAGE     CERVICAL CERCLAGE N/A 02/18/2015   Procedure: CERCLAGE CERVICAL;  Surgeon: Woodroe Mode, MD;  Location: [facility];  Service: Gynecology;  Laterality: N/A;   PILONIDAL CYST / SINUS EXCISION       OB History     Gravida  11   Para  8   Term  7   Preterm  1   AB  2   Living  6      SAB  1   IAB  1   Ectopic  0   Multiple  0   Live  Births  6           Family History  Problem Relation Age of Onset   Hypertension Mother    Cancer Sister     Social History   Tobacco Use   Smoking status: Every Day    Packs/day: 0.25    Years: 3.00    Pack years: 0.75    Types: Cigarettes   Smokeless tobacco: Never  Vaping Use   Vaping Use: Never used  Substance Use Topics   Alcohol use: No    Alcohol/week: 0.0 standard drinks   Drug use: Yes    Types: Marijuana    Home Medications Prior to Admission medications   Medication Sig Start Date End Date Taking? Authorizing Provider  acetaminophen (TYLENOL) 500 MG tablet Take 2 tablets (1,000 mg total) by mouth every 6 (six) hours as needed for moderate pain. 10/19/18   Tamala Julian, [place], CNM  hydrochlorothiazide (HYDRODIURIL) 25 MG tablet Take 1 tablet (25 mg total) by mouth daily. 10/19/18   Tamala Julian, [place], CNM  ketorolac (TORADOL) 10 MG tablet Take 1 tablet (10 mg total) by mouth every 6 (six) hours as needed. 10/19/18  Tamala Julian, [place], CNM  ondansetron (ZOFRAN ODT) 4 MG disintegrating tablet 4mg  ODT q4 hours prn nausea/vomit 07/11/19   Jacqlyn Larsen, PA-C    Allergies    Patient has no known allergies.  Review of Systems   Review of Systems  Constitutional:  Negative for fever.  HENT:  Positive for congestion. Negative for sore throat.   Eyes:  Negative for visual disturbance.  Respiratory:  Negative for cough and shortness of breath.   Cardiovascular:  Negative for chest pain.  Gastrointestinal:  Positive for abdominal pain.  Genitourinary:  Positive for pelvic pain and vaginal discharge. Negative for dysuria.  Musculoskeletal:  Positive for back pain.  Skin:  Negative for rash.  Neurological:  Negative for headaches.   Physical Exam Updated Vital Signs BP (!) 179/119 (BP Location: Left Arm)   Pulse 88   Temp 98.7 F (37.1 C) (Oral)   Resp 16   Ht 5\' 3"  (1.6 m)   Wt 85.7 kg   LMP 05/27/2021 (Approximate)   SpO2 99%   BMI 33.48 kg/m   Physical  Exam Vitals and nursing note reviewed.  Constitutional:      General: She is not in acute distress.    Appearance: Normal appearance. She is well-developed.  HENT:     Head: Normocephalic and atraumatic.  Eyes:     Conjunctiva/sclera: Conjunctivae normal.  Cardiovascular:     Rate and Rhythm: Normal rate and regular rhythm.     Heart sounds: No murmur heard. Pulmonary:     Effort: Pulmonary effort is normal. No respiratory distress.     Breath sounds: Normal breath sounds.  Abdominal:     Palpations: Abdomen is soft.     Tenderness: There is abdominal tenderness (diffuse). There is no guarding or rebound.  Genitourinary:    Comments: Pelvic exam was done with nurse Caryl Pina as chaperone.  Normal external genitalia no lesions appreciated.  Speculum exam done with small amount of thick white discharge in vault.  No lesions identified.  She has some diffuse adnexal and uterine tenderness without chandelier sign.  Patient tolerated procedure well Musculoskeletal:        General: No deformity or signs of injury. Normal range of motion.     Cervical back: Neck supple.  Skin:    General: Skin is warm and dry.  Neurological:     General: No focal deficit present.     Mental Status: She is alert.    ED Results / Procedures / Treatments   Labs (all labs ordered are listed, but only abnormal results are displayed) Labs Reviewed  WET PREP, GENITAL - Abnormal; Notable for the following components:      Result Value   WBC, Wet Prep HPF POC MANY (*)    All other components within normal limits  URINALYSIS, ROUTINE W REFLEX MICROSCOPIC - Abnormal; Notable for the following components:   Hgb urine dipstick MODERATE (*)    Bacteria, UA RARE (*)    All other components within normal limits  LIPASE, BLOOD  COMPREHENSIVE METABOLIC PANEL  CBC  I-STAT BETA HCG BLOOD, ED ([facility], [facility], [facility] ONLY)  GC/CHLAMYDIA PROBE AMP ([facility]) NOT AT [facility]    EKG None  Radiology US PELVIC COMPLETE W  TRANSVAGINAL AND TORSION R/O  Result Date: 07/03/2021 CLINICAL DATA:  Pelvic Pain. EXAM: TRANSABDOMINAL AND TRANSVAGINAL ULTRASOUND OF PELVIS DOPPLER ULTRASOUND OF OVARIES TECHNIQUE: Both transabdominal and transvaginal ultrasound examinations of the pelvis were performed. Transabdominal technique was performed for global imaging of  the pelvis including uterus, ovaries, adnexal regions, and pelvic cul-de-sac. It was necessary to proceed with endovaginal exam following the transabdominal exam to visualize the adnexa. Color and duplex Doppler ultrasound was utilized to evaluate blood flow to the ovaries. COMPARISON:  None. FINDINGS: Uterus Measurements: 8.9 x 5.2 x 8.9 cm. No fibroids or other mass visualized. Endometrium Thickness: 9.  No focal abnormality visualized. Right ovary Measurements: 4.4 x 2.1 x 3.1 cm = volume: 14.6 mL. Normal appearance/no adnexal mass. Left ovary Measurements: 3.3 x 2.4 x 2.2 cm = volume: 9.4 mL. Normal appearance/no adnexal mass. Pulsed Doppler evaluation of both ovaries demonstrates normal low-resistance arterial and venous waveforms. Other findings No abnormal free fluid. IMPRESSION: Negative pelvic ultrasound. No acute or focal lesion to explain pelvic pain. Electronically Signed   By: San Morelle M.D.   On: 07/03/2021 12:35    Procedures Procedures   Medications Ordered in ED Medications - No data to display  ED Course  I have reviewed the triage vital signs and the nursing notes.  Pertinent labs & imaging results that were available during my care of the patient were reviewed by me and considered in my medical decision making (see chart for details).  Clinical Course as of 07/03/21 1943  Mon Jul 03, 2021  1242 Patient's labs and ultrasound did not show any acute findings that would require admission.  She is hypertensive here.  Recommended close follow-up with primary care regarding hypertension and her other symptoms. [MB]    Clinical Course User  Index [MB] Hayden Rasmussen, MD   MDM Rules/Calculators/A&P                         This patient complains of pelvic and back pain, vaginal discharge, upper respiratory infection; this involves an extensive number of treatment Options and is a complaint that carries with it a high risk of complications and Morbidity. The differential includes PID, UTI, ovarian torsion, endometriosis, STD  I ordered, reviewed and interpreted labs, which included CBC with normal white count normal hemoglobin, chemistries and LFTs normal, urinalysis unremarkable, pregnancy test negative, I wet prep negative, GC and chlamydia pending at time of discharge I ordered imaging studies which included pelvic ultrasound and I independently    visualized and interpreted imaging which showed no acute findings Previous records obtained and reviewed in epic, no recent admissions  After the interventions stated above, I reevaluated the patient and found patient to be hemodynamically stable with a benign exam.  No indications for antibiotics currently.  Recommended symptomatic treatment with Tylenol and ibuprofen.  She also will need follow-up for her elevated blood pressures.  Return instructions discussed   Final Clinical Impression(s) / ED Diagnoses Final diagnoses:  Pelvic pain  Acute bilateral low back pain without sciatica  Nasal congestion  Hypertension, unspecified type  Late period    Rx / DC Orders ED Discharge Orders     None        Hayden Rasmussen, MD 07/03/21 1946

## 2021-07-04 LAB — GC/CHLAMYDIA PROBE AMP (~~LOC~~) NOT AT ARMC
Chlamydia: NEGATIVE
Comment: NEGATIVE
Comment: NORMAL
Neisseria Gonorrhea: NEGATIVE

## 2021-10-16 ENCOUNTER — Encounter: Payer: Self-pay | Admitting: *Deleted

## 2021-12-01 ENCOUNTER — Other Ambulatory Visit: Payer: Self-pay

## 2021-12-01 ENCOUNTER — Emergency Department (HOSPITAL_COMMUNITY)
Admission: EM | Admit: 2021-12-01 | Discharge: 2021-12-01 | Disposition: A | Discharge status: Home / Self Care | Payer: Self-pay | Attending: Emergency Medicine | Admitting: Emergency Medicine

## 2021-12-01 ENCOUNTER — Encounter (HOSPITAL_COMMUNITY): Payer: Self-pay | Admitting: Emergency Medicine

## 2021-12-01 DIAGNOSIS — I10 Essential (primary) hypertension: Secondary | ICD-10-CM | POA: Insufficient documentation

## 2021-12-01 DIAGNOSIS — Z20822 Contact with and (suspected) exposure to covid-19: Secondary | ICD-10-CM | POA: Insufficient documentation

## 2021-12-01 DIAGNOSIS — R220 Localized swelling, mass and lump, head: Secondary | ICD-10-CM | POA: Insufficient documentation

## 2021-12-01 DIAGNOSIS — F17210 Nicotine dependence, cigarettes, uncomplicated: Secondary | ICD-10-CM | POA: Insufficient documentation

## 2021-12-01 DIAGNOSIS — Z79899 Other long term (current) drug therapy: Secondary | ICD-10-CM | POA: Insufficient documentation

## 2021-12-01 DIAGNOSIS — E039 Hypothyroidism, unspecified: Secondary | ICD-10-CM | POA: Insufficient documentation

## 2021-12-01 DIAGNOSIS — J101 Influenza due to other identified influenza virus with other respiratory manifestations: Secondary | ICD-10-CM | POA: Insufficient documentation

## 2021-12-01 DIAGNOSIS — J3489 Other specified disorders of nose and nasal sinuses: Secondary | ICD-10-CM | POA: Insufficient documentation

## 2021-12-01 DIAGNOSIS — Z760 Encounter for issue of repeat prescription: Secondary | ICD-10-CM | POA: Insufficient documentation

## 2021-12-01 LAB — RESP PANEL BY RT-PCR (FLU A&B, COVID) ARPGX2
Influenza A by PCR: POSITIVE — AB
Influenza B by PCR: NEGATIVE
SARS Coronavirus 2 by RT PCR: NEGATIVE

## 2021-12-01 MED ORDER — HYDROCHLOROTHIAZIDE 25 MG PO TABS: 25.0000 mg | ORAL_TABLET | Freq: Every day | ORAL | 0 refills | Status: AC

## 2021-12-01 MED ORDER — ACETAMINOPHEN 325 MG PO TABS
650.0000 mg | ORAL_TABLET | Freq: Once | ORAL | Status: AC
  Administered 2021-12-01: 650 mg via ORAL
  Filled 2021-12-01: qty 2

## 2021-12-01 MED ORDER — HYDROCHLOROTHIAZIDE 25 MG PO TABS: 25.0000 mg | ORAL_TABLET | Freq: Every day | ORAL | 0 refills | Status: DC

## 2021-12-01 MED ORDER — GUAIFENESIN ER 600 MG PO TB12: 600.0000 mg | ORAL_TABLET | Freq: Two times a day (BID) | ORAL | 0 refills | Status: AC

## 2021-12-01 MED ORDER — FLUTICASONE PROPIONATE 50 MCG/ACT NA SUSP: 2.0000 | Freq: Every day | NASAL | 0 refills | Status: DC

## 2021-12-01 MED ORDER — GUAIFENESIN ER 600 MG PO TB12: 600.0000 mg | ORAL_TABLET | Freq: Two times a day (BID) | ORAL | 0 refills | Status: DC

## 2021-12-01 MED ORDER — FLUTICASONE PROPIONATE 50 MCG/ACT NA SUSP: 2.0000 | Freq: Every day | NASAL | 0 refills | Status: AC

## 2021-12-01 NOTE — ED Provider Notes (Signed)
[facility] DEPT Provider Note   CSN: [id number] Arrival date & time: 12/01/21  1409     History Chief Complaint  Patient presents with   Nasal Congestion   Cough   Headache    Kylie Hicks is a 35 y.o. female.  HPI   Pt is a 35 y/o female with a h/o anemia, chlamydia, heart murmur, HTN, hypothyroidism, who presents to the ED today for eval of uri sxs. States that for the last 3 days she has had rhinorrhea, sinus pressure/pain, right facial swelling. She further c/o subjective fevers, sweats, chills, headaches, body aches, mild cough. She had negative covid tests at home.  Ran out of BP meds and does not have PCP, was previously on HCTZ 25 mg.   Past Medical History:  Diagnosis Date   Anemia    with pregnancies   Chlamydia    Heart murmur    History of preterm labor, current pregnancy    Hypertension    Hypothyroidism    No meds   Incompetent cervix    cerclage   Postpartum depression 2006    Patient Active Problem List   Diagnosis Date Noted   Mild aortic stenosis 04/29/2015   Palpitations 03/03/2015   Marijuana use 12/21/2014   History of postpartum depression 09/10/2011   Hypertension     Past Surgical History:  Procedure Laterality Date   CERVICAL CERCLAGE     CERVICAL CERCLAGE N/A 02/18/2015   Procedure: CERCLAGE CERVICAL;  Surgeon: Woodroe Mode, MD;  Location: [facility];  Service: Gynecology;  Laterality: N/A;   PILONIDAL CYST / SINUS EXCISION       OB History     Gravida  11   Para  8   Term  7   Preterm  1   AB  2   Living  6      SAB  1   IAB  1   Ectopic  0   Multiple  0   Live Births  6           Family History  Problem Relation Age of Onset   Hypertension Mother    Cancer Sister     Social History   Tobacco Use   Smoking status: Every Day    Packs/day: 0.25    Years: 3.00    Pack years: 0.75    Types: Cigarettes   Smokeless tobacco: Never  Vaping Use   Vaping Use: Never  used  Substance Use Topics   Alcohol use: No    Alcohol/week: 0.0 standard drinks   Drug use: Yes    Types: Marijuana    Home Medications Prior to Admission medications   Medication Sig Start Date End Date Taking? Authorizing Provider  fluticasone (FLONASE) 50 MCG/ACT nasal spray Place 2 sprays into both nostrils daily. 12/01/21  Yes Marlinda Miranda S, PA-C  guaiFENesin (MUCINEX) 600 MG 12 hr tablet Take 1 tablet (600 mg total) by mouth 2 (two) times daily. 12/01/21  Yes Desmen Schoffstall S, PA-C  hydrochlorothiazide (HYDRODIURIL) 25 MG tablet Take 1 tablet (25 mg total) by mouth daily. 12/01/21 12/31/21 Yes Tayte Mcwherter S, PA-C  acetaminophen (TYLENOL) 500 MG tablet Take 2 tablets (1,000 mg total) by mouth every 6 (six) hours as needed for moderate pain. 10/19/18   Tamala Julian, [place], CNM  hydrochlorothiazide (HYDRODIURIL) 25 MG tablet Take 1 tablet (25 mg total) by mouth daily. 10/19/18   Tamala Julian, [place], CNM  ketorolac (TORADOL) 10 MG tablet Take  1 tablet (10 mg total) by mouth every 6 (six) hours as needed. 10/19/18   Tamala Julian, [place], CNM  ondansetron (ZOFRAN ODT) 4 MG disintegrating tablet 4mg  ODT q4 hours prn nausea/vomit 07/11/19   Jacqlyn Larsen, PA-C    Allergies    Patient has no known allergies.  Review of Systems   Review of Systems  Constitutional:  Positive for chills, diaphoresis and fever.  HENT:  Positive for congestion, sinus pressure and sinus pain. Negative for ear pain and sore throat.   Eyes:  Negative for visual disturbance.  Respiratory:  Positive for cough. Negative for shortness of breath.   Cardiovascular:  Negative for chest pain.  Gastrointestinal:  Negative for abdominal pain, constipation, diarrhea, nausea and vomiting.  Genitourinary:  Negative for dysuria and hematuria.  Musculoskeletal:  Positive for myalgias.  Skin:  Negative for color change and rash.  Neurological:  Positive for headaches.  All other systems reviewed and are negative.  Physical  Exam Updated Vital Signs BP (!) 160/129 (BP Location: Left Arm)    Pulse 83    Temp 99.4 F (37.4 C) (Oral)    Resp 18    SpO2 100%   Physical Exam Vitals and nursing note reviewed.  Constitutional:      General: She is not in acute distress.    Appearance: She is well-developed.  HENT:     Head: Normocephalic and atraumatic.     Nose:     Right Turbinates: Enlarged.     Left Turbinates: Enlarged.     Right Sinus: Maxillary sinus tenderness present.  Eyes:     Conjunctiva/sclera: Conjunctivae normal.  Cardiovascular:     Rate and Rhythm: Normal rate and regular rhythm.     Heart sounds: No murmur heard. Pulmonary:     Effort: Pulmonary effort is normal. No respiratory distress.     Breath sounds: Normal breath sounds.  Abdominal:     Palpations: Abdomen is soft.     Tenderness: There is no abdominal tenderness.  Musculoskeletal:        General: No swelling.     Cervical back: Neck supple.  Skin:    General: Skin is warm and dry.     Capillary Refill: Capillary refill takes less than 2 seconds.  Neurological:     Mental Status: She is alert.  Psychiatric:        Mood and Affect: Mood normal.    ED Results / Procedures / Treatments   Labs (all labs ordered are listed, but only abnormal results are displayed) Labs Reviewed  RESP PANEL BY RT-PCR (FLU A&B, COVID) ARPGX2 - Abnormal; Notable for the following components:      Result Value   Influenza A by PCR POSITIVE (*)    All other components within normal limits    EKG None  Radiology No results found.  Procedures Procedures   Medications Ordered in ED Medications  acetaminophen (TYLENOL) tablet 650 mg (650 mg Oral Given 12/01/21 1458)    ED Course  I have reviewed the triage vital signs and the nursing notes.  Pertinent labs & imaging results that were available during my care of the patient were reviewed by me and considered in my medical decision making (see chart for details).    MDM  Rules/Calculators/A&P                          35 y/o female presenting with URI sxs. Noted to be flu +.  Out of the window for tamiflu. Rx for symptomatic meds given. Do not feel requires admission based on current presentation. Htn meds refilled. Pcp f/u given. Advised on return precautions. All questions answered, pt stable for discharge.     Final Clinical Impression(s) / ED Diagnoses Final diagnoses:  Influenza A  Medication refill    Rx / DC Orders ED Discharge Orders          Ordered    hydrochlorothiazide (HYDRODIURIL) 25 MG tablet  Daily        12/01/21 1841    fluticasone (FLONASE) 50 MCG/ACT nasal spray  Daily        12/01/21 1841    guaiFENesin (MUCINEX) 600 MG 12 hr tablet  2 times daily        12/01/21 1851             Rodney Booze, PA-C 12/01/21 1918    Hayden Rasmussen, MD 12/01/21 1925

## 2021-12-01 NOTE — ED Triage Notes (Signed)
Pt reports "not feeling well" since wednesday. Pt reports body aches, cough, congestion and drainage, and headache. Pt denies fevers. Pt states she has taken ibuprofen at home but denies any other meds.

## 2021-12-01 NOTE — Discharge Instructions (Addendum)
You were diagnosed with influenza today.   You were given medications to help with your symptoms   Additionally your blood pressure medications were restarted. You will need to follow up in 2 weeks with a primary care provide for recheck. You should keep a blood pressure log and bring it to your appointment.   Please return to the emergency department for any new or worsening symptoms.

## 2022-01-16 ENCOUNTER — Emergency Department (HOSPITAL_COMMUNITY)
Admission: EM | Admit: 2022-01-16 | Discharge: 2022-01-16 | Disposition: A | Discharge status: Home / Self Care | Payer: Commercial Managed Care - HMO | Attending: Emergency Medicine | Admitting: Emergency Medicine

## 2022-01-16 ENCOUNTER — Encounter (HOSPITAL_COMMUNITY): Payer: Self-pay | Admitting: Emergency Medicine

## 2022-01-16 ENCOUNTER — Emergency Department (HOSPITAL_COMMUNITY): Payer: Commercial Managed Care - HMO

## 2022-01-16 DIAGNOSIS — R102 Pelvic and perineal pain: Secondary | ICD-10-CM | POA: Diagnosis not present

## 2022-01-16 DIAGNOSIS — B9689 Other specified bacterial agents as the cause of diseases classified elsewhere: Secondary | ICD-10-CM | POA: Diagnosis not present

## 2022-01-16 DIAGNOSIS — A749 Chlamydial infection, unspecified: Secondary | ICD-10-CM | POA: Insufficient documentation

## 2022-01-16 DIAGNOSIS — D259 Leiomyoma of uterus, unspecified: Secondary | ICD-10-CM | POA: Diagnosis not present

## 2022-01-16 DIAGNOSIS — N760 Acute vaginitis: Secondary | ICD-10-CM | POA: Diagnosis not present

## 2022-01-16 DIAGNOSIS — R1030 Lower abdominal pain, unspecified: Secondary | ICD-10-CM | POA: Diagnosis present

## 2022-01-16 LAB — CBC
HCT: 37 % (ref 36.0–46.0)
Hemoglobin: 11.6 g/dL — ABNORMAL LOW (ref 12.0–15.0)
MCH: 26.1 pg (ref 26.0–34.0)
MCHC: 31.4 g/dL (ref 30.0–36.0)
MCV: 83.3 fL (ref 80.0–100.0)
Platelets: 361 10*3/uL (ref 150–400)
RBC: 4.44 MIL/uL (ref 3.87–5.11)
RDW: 16 % — ABNORMAL HIGH (ref 11.5–15.5)
WBC: 8.2 10*3/uL (ref 4.0–10.5)
nRBC: 0 % (ref 0.0–0.2)

## 2022-01-16 LAB — COMPREHENSIVE METABOLIC PANEL
ALT: 23 U/L (ref 0–44)
AST: 21 U/L (ref 15–41)
Albumin: 3.9 g/dL (ref 3.5–5.0)
Alkaline Phosphatase: 91 U/L (ref 38–126)
Anion gap: 5 (ref 5–15)
BUN: 14 mg/dL (ref 6–20)
CO2: 27 mmol/L (ref 22–32)
Calcium: 9.5 mg/dL (ref 8.9–10.3)
Chloride: 108 mmol/L (ref 98–111)
Creatinine, Ser: 0.89 mg/dL (ref 0.44–1.00)
GFR, Estimated: >60 mL/min (ref >60)
Glucose, Bld: 90 mg/dL (ref 70–99)
Potassium: 3.8 mmol/L (ref 3.5–5.1)
Sodium: 140 mmol/L (ref 135–145)
Total Bilirubin: 0.1 mg/dL — ABNORMAL LOW (ref 0.3–1.2)
Total Protein: 7.8 g/dL (ref 6.5–8.1)

## 2022-01-16 LAB — URINALYSIS, ROUTINE W REFLEX MICROSCOPIC
Bacteria, UA: NONE SEEN
Bilirubin Urine: NEGATIVE
Glucose, UA: NEGATIVE mg/dL
Ketones, ur: NEGATIVE mg/dL
Nitrite: NEGATIVE
Protein, ur: NEGATIVE mg/dL
Specific Gravity, Urine: 1.017 (ref 1.005–1.030)
pH: 5 (ref 5.0–8.0)

## 2022-01-16 LAB — WET PREP, GENITAL
Sperm: NONE SEEN
Trich, Wet Prep: NONE SEEN
WBC, Wet Prep HPF POC: >=10 — AB (ref <10)
Yeast Wet Prep HPF POC: NONE SEEN

## 2022-01-16 LAB — I-STAT BETA HCG BLOOD, ED (MC, WL, AP ONLY): I-stat hCG, quantitative: <5 m[IU]/mL (ref <5)

## 2022-01-16 LAB — LIPASE, BLOOD: Lipase: 30 U/L (ref 11–51)

## 2022-01-16 MED ORDER — METRONIDAZOLE 500 MG PO TABS: 500.0000 mg | ORAL_TABLET | Freq: Two times a day (BID) | ORAL | 0 refills | Status: AC

## 2022-01-16 NOTE — ED Triage Notes (Signed)
Per pt, states she is having lower abdominal pain, vaginal discharge-states she took 2 pregnancy tests, 1 was negative and 1 was positive-states LMP was in the "middle" of this month

## 2022-01-16 NOTE — ED Provider Notes (Signed)
[facility] DEPT Provider Note   CSN: [id number] Arrival date & time: 01/16/22  1338     History  Chief Complaint  Patient presents with   Abdominal Pain    Kylie Hicks is a 36 y.o. female.   Abdominal Pain  36 year old female presenting to the emergency department with lower abdominal pain and some vaginal discharge.  Patient states that her last menstrual period was roughly 2 weeks ago.  She endorses some increasing vaginal discharge.  No vaginal bleeding.  Pelvic pain is described as cramping.  She is worried she could be pregnant.  She denies any dysuria, fever, chills, flank pain.  She is not worried about sexually transmitted infections at this time.  She concedes to testing but declines prophylaxis.  Home Medications Prior to Admission medications   Medication Sig Start Date End Date Taking? Authorizing Provider  metroNIDAZOLE (FLAGYL) 500 MG tablet Take 1 tablet (500 mg total) by mouth 2 (two) times daily for 7 days. 01/16/22 01/23/22 Yes Regan Lemming, MD  acetaminophen (TYLENOL) 500 MG tablet Take 2 tablets (1,000 mg total) by mouth every 6 (six) hours as needed for moderate pain. 10/19/18   Tamala Julian, [place], CNM  fluticasone (FLONASE) 50 MCG/ACT nasal spray Place 2 sprays into both nostrils daily. 12/01/21   Couture, Cortni S, PA-C  guaiFENesin (MUCINEX) 600 MG 12 hr tablet Take 1 tablet (600 mg total) by mouth 2 (two) times daily. 12/01/21   Couture, Cortni S, PA-C  hydrochlorothiazide (HYDRODIURIL) 25 MG tablet Take 1 tablet (25 mg total) by mouth daily. 10/19/18   Tamala Julian, [place], CNM  hydrochlorothiazide (HYDRODIURIL) 25 MG tablet Take 1 tablet (25 mg total) by mouth daily. 12/01/21 12/31/21  Couture, Cortni S, PA-C  ketorolac (TORADOL) 10 MG tablet Take 1 tablet (10 mg total) by mouth every 6 (six) hours as needed. 10/19/18   Tamala Julian, [place], CNM  ondansetron (ZOFRAN ODT) 4 MG disintegrating tablet 4mg  ODT q4 hours prn nausea/vomit 07/11/19    Jacqlyn Larsen, PA-C      Allergies    Patient has no known allergies.    Review of Systems   Review of Systems  Gastrointestinal:  Positive for abdominal pain.   Physical Exam Updated Vital Signs BP (!) 160/99    Pulse 65    Temp 98.2 F (36.8 C) (Oral)    Resp (!) 22    SpO2 100%  Physical Exam Vitals and nursing note reviewed. Exam conducted with a chaperone present.  Constitutional:      General: She is not in acute distress.    Appearance: She is well-developed.  HENT:     Head: Normocephalic and atraumatic.  Eyes:     Conjunctiva/sclera: Conjunctivae normal.     Pupils: Pupils are equal, round, and reactive to light.  Cardiovascular:     Rate and Rhythm: Normal rate and regular rhythm.     Heart sounds: No murmur heard. Pulmonary:     Effort: Pulmonary effort is normal. No respiratory distress.     Breath sounds: Normal breath sounds.  Abdominal:     General: There is no distension.     Palpations: Abdomen is soft.     Tenderness: There is abdominal tenderness in the suprapubic area. There is no guarding.  Genitourinary:    Cervix: Cervical motion tenderness present.     Adnexa: Right adnexa normal and left adnexa normal.  Musculoskeletal:        General: No swelling, deformity or signs of injury.  Cervical back: Neck supple.  Skin:    General: Skin is warm and dry.     Capillary Refill: Capillary refill takes less than 2 seconds.     Findings: No lesion or rash.  Neurological:     General: No focal deficit present.     Mental Status: She is alert. Mental status is at baseline.  Psychiatric:        Mood and Affect: Mood normal.    ED Results / Procedures / Treatments   Labs (all labs ordered are listed, but only abnormal results are displayed) Labs Reviewed  WET PREP, GENITAL - Abnormal; Notable for the following components:      Result Value   Clue Cells Wet Prep HPF POC PRESENT (*)    WBC, Wet Prep HPF POC >=10 (*)    All other components within  normal limits  COMPREHENSIVE METABOLIC PANEL - Abnormal; Notable for the following components:   Total Bilirubin 0.1 (*)    All other components within normal limits  CBC - Abnormal; Notable for the following components:   Hemoglobin 11.6 (*)    RDW 16.0 (*)    All other components within normal limits  URINALYSIS, ROUTINE W REFLEX MICROSCOPIC - Abnormal; Notable for the following components:   APPearance HAZY (*)    Hgb urine dipstick MODERATE (*)    Leukocytes,Ua MODERATE (*)    All other components within normal limits  GC/CHLAMYDIA PROBE AMP ([place]) NOT AT [facility] - Abnormal; Notable for the following components:   Chlamydia Positive (*)    All other components within normal limits  LIPASE, BLOOD  I-STAT BETA HCG BLOOD, ED ([facility], [facility], [facility] ONLY)    EKG None  Radiology US PELVIC COMPLETE W TRANSVAGINAL AND TORSION R/O  Result Date: 01/16/2022 CLINICAL DATA:  Pelvic pain x3 days EXAM: TRANSABDOMINAL AND TRANSVAGINAL ULTRASOUND OF PELVIS DOPPLER ULTRASOUND OF OVARIES TECHNIQUE: Both transabdominal and transvaginal ultrasound examinations of the pelvis were performed. Transabdominal technique was performed for global imaging of the pelvis including uterus, ovaries, adnexal regions, and pelvic cul-de-sac. It was necessary to proceed with endovaginal exam following the transabdominal exam to visualize the uterus and bilateral ovaries. Color and duplex Doppler ultrasound was utilized to evaluate blood flow to the ovaries. COMPARISON:  07/03/2021 FINDINGS: Uterus Measurements: 9.4 x 5.2 x 6.6 cm = volume: 168 mL. 12 x 9 x 11 mm intramural fibroid in the right anterior uterine fundus. Endometrium Thickness: 10 mm.  No focal abnormality visualized. Right ovary Measurements: 3.1 x 2.0 x 2.3 cm = volume: 7.2 mL. Normal appearance/no adnexal mass. Left ovary Measurements: 3.3 x 2.5 x 2.9 cm = volume: 12.6 mL. 1.7 cm hemorrhagic corpus luteal cyst, physiologic/benign. Dedicated follow-up imaging not  required. Pulsed Doppler evaluation of both ovaries demonstrates normal low-resistance arterial and venous waveforms. Other findings No abnormal free fluid. IMPRESSION: 12 mm intramural fibroid in the right anterior uterine fundus. Otherwise negative pelvic ultrasound. No evidence of ovarian torsion. Electronically Signed   By: Julian Hy M.D.   On: 01/16/2022 19:48    Procedures Procedures    Medications Ordered in ED Medications - No data to display  ED Course/ Medical Decision Making/ A&P Clinical Course as of 01/18/22 1624  Tue Jan 16, 2022  2051 Clue Cells Wet Prep HPF POC(!): PRESENT [JL]  Thu Jan 18, 2022  1611 Chlamydia(!): Positive [JL]    Clinical Course User Index [JL] Regan Lemming, MD  Medical Decision Making Amount and/or Complexity of Data Reviewed Labs: ordered. Decision-making details documented in ED Course. Radiology: ordered. ECG/medicine tests: ordered.  Risk Prescription drug management.   36 year old female presenting to the emergency department with lower abdominal pain and some vaginal discharge.  Patient states that her last menstrual period was roughly 2 weeks ago.  She endorses some increasing vaginal discharge.  No vaginal bleeding.  Pelvic pain is described as cramping.  She is worried she could be pregnant.  She denies any dysuria, fever, chills, flank pain.  She is not worried about sexually transmitted infections at this time.  She concedes to testing but declines prophylaxis.  Kylie Hicks is a 36 y.o. female who presented to the Emergency Department c/o lower abdominal/pelvic pain. Past medical records have been reviewed and are notable for cervical cerclage.  Pertinent exam findings include:+ CMT on exam, discharge present, suprapubic TTP   Lab results include:Clue cells on wet prep with WBCs, + for Chlamydia.  Imaging results include: IMPRESSION:  12 mm intramural fibroid in the right anterior uterine  fundus.     Otherwise negative pelvic ultrasound.     No evidence of ovarian torsion.    With presenting history and physical exam, doubt Bowel Obstruction, AAA, ACS, pneumonia, pneumothorax, Pyelonephritis, Nephrolithiasis, Pancreatitis, Cholecystitis, Shingles, Perforated Bowel or Ulcer, Diverticulosis/itis, Ischemic Mesentery, Inflammatory Bowel Disease, Strangulated/Incarcerated Hernia, gastritis, PUD. Patient without peritoneal signs or other indication of need for surgical intervention.  Presentation more consistent with Bacterial vaginosis. Treated with Flagyl. Of note, patient did test positive for Chlamydia and was informed. Low concern for PID at this time. Doxycycline was called into her pharmacy [facility] [address] in [place]. Negative for gonorrhea. Pt advised to follow-up with her OBGYN for repeat assessment and to discuss treatment options for her fibroids.    Final Clinical Impression(s) / ED Diagnoses Final diagnoses:  Pelvic pain  Bacterial vaginosis  Uterine leiomyoma, unspecified location  Chlamydia    Rx / DC Orders ED Discharge Orders          Ordered    metroNIDAZOLE (FLAGYL) 500 MG tablet  2 times daily        01/16/22 2054              Regan Lemming, MD 01/18/22 1627

## 2022-01-16 NOTE — Discharge Instructions (Addendum)
You were evaluated in the Emergency Department and after careful evaluation, we did not find any emergent condition requiring admission or further testing in the hospital.  Your exam/testing today was overall reassuring.  There was a 12 mm intramural fibroid within the right anterior uterine fundus which could be causing some discomfort and can cause abnormal uterine bleeding.  No other abnormalities were noted on your ultrasound with no evidence of ovarian torsion.  We treat bacterial vaginosis with Flagyl.  Recommend you abstain from alcohol while on Flagyl as this can cause an abnormal reaction.  Please return to the Emergency Department if you experience any worsening of your condition.  Thank you for allowing [place] to be a part of your care.

## 2022-01-17 LAB — GC/CHLAMYDIA PROBE AMP (~~LOC~~) NOT AT ARMC
Chlamydia: POSITIVE — AB
Comment: NEGATIVE
Comment: NORMAL
Neisseria Gonorrhea: NEGATIVE

## 2022-01-20 ENCOUNTER — Other Ambulatory Visit: Payer: Self-pay | Admitting: Advanced Practice Midwife

## 2022-01-20 DIAGNOSIS — I10 Essential (primary) hypertension: Secondary | ICD-10-CM

## 2022-01-23 ENCOUNTER — Emergency Department (HOSPITAL_BASED_OUTPATIENT_CLINIC_OR_DEPARTMENT_OTHER)
Admission: EM | Admit: 2022-01-23 | Discharge: 2022-01-24 | Disposition: A | Discharge status: Home / Self Care | Payer: Commercial Managed Care - HMO | Attending: Emergency Medicine | Admitting: Emergency Medicine

## 2022-01-23 ENCOUNTER — Encounter (HOSPITAL_BASED_OUTPATIENT_CLINIC_OR_DEPARTMENT_OTHER): Payer: Self-pay

## 2022-01-23 ENCOUNTER — Other Ambulatory Visit: Payer: Self-pay

## 2022-01-23 DIAGNOSIS — O26899 Other specified pregnancy related conditions, unspecified trimester: Secondary | ICD-10-CM | POA: Insufficient documentation

## 2022-01-23 DIAGNOSIS — R102 Pelvic and perineal pain: Secondary | ICD-10-CM | POA: Diagnosis not present

## 2022-01-23 DIAGNOSIS — Z3490 Encounter for supervision of normal pregnancy, unspecified, unspecified trimester: Secondary | ICD-10-CM

## 2022-01-23 DIAGNOSIS — Z3A00 Weeks of gestation of pregnancy not specified: Secondary | ICD-10-CM | POA: Insufficient documentation

## 2022-01-23 DIAGNOSIS — N9489 Other specified conditions associated with female genital organs and menstrual cycle: Secondary | ICD-10-CM | POA: Diagnosis not present

## 2022-01-23 LAB — URINALYSIS, ROUTINE W REFLEX MICROSCOPIC
Glucose, UA: NEGATIVE mg/dL
Ketones, ur: 15 mg/dL — AB
Leukocytes,Ua: NEGATIVE
Nitrite: NEGATIVE
Protein, ur: NEGATIVE mg/dL
Specific Gravity, Urine: >=1.03 (ref 1.005–1.030)
pH: 5.5 (ref 5.0–8.0)

## 2022-01-23 LAB — COMPREHENSIVE METABOLIC PANEL
ALT: 16 U/L (ref 0–44)
AST: 21 U/L (ref 15–41)
Albumin: 3.8 g/dL (ref 3.5–5.0)
Alkaline Phosphatase: 90 U/L (ref 38–126)
Anion gap: 10 (ref 5–15)
BUN: 15 mg/dL (ref 6–20)
CO2: 22 mmol/L (ref 22–32)
Calcium: 8.9 mg/dL (ref 8.9–10.3)
Chloride: 104 mmol/L (ref 98–111)
Creatinine, Ser: 1.03 mg/dL — ABNORMAL HIGH (ref 0.44–1.00)
GFR, Estimated: >60 mL/min (ref >60)
Glucose, Bld: 92 mg/dL (ref 70–99)
Potassium: 3.3 mmol/L — ABNORMAL LOW (ref 3.5–5.1)
Sodium: 136 mmol/L (ref 135–145)
Total Bilirubin: 0.6 mg/dL (ref 0.3–1.2)
Total Protein: 7.5 g/dL (ref 6.5–8.1)

## 2022-01-23 LAB — CBC
HCT: 36.7 % (ref 36.0–46.0)
Hemoglobin: 11.8 g/dL — ABNORMAL LOW (ref 12.0–15.0)
MCH: 26.2 pg (ref 26.0–34.0)
MCHC: 32.2 g/dL (ref 30.0–36.0)
MCV: 81.4 fL (ref 80.0–100.0)
Platelets: 419 10*3/uL — ABNORMAL HIGH (ref 150–400)
RBC: 4.51 MIL/uL (ref 3.87–5.11)
RDW: 15.8 % — ABNORMAL HIGH (ref 11.5–15.5)
WBC: 7.8 10*3/uL (ref 4.0–10.5)
nRBC: 0 % (ref 0.0–0.2)

## 2022-01-23 LAB — URINALYSIS, MICROSCOPIC (REFLEX): WBC, UA: NONE SEEN WBC/hpf (ref 0–5)

## 2022-01-23 LAB — LIPASE, BLOOD: Lipase: 27 U/L (ref 11–51)

## 2022-01-23 LAB — HCG, QUANTITATIVE, PREGNANCY: hCG, Beta Chain, Quant, S: 111 m[IU]/mL — ABNORMAL HIGH (ref <5)

## 2022-01-23 LAB — HCG, SERUM, QUALITATIVE: Preg, Serum: POSITIVE — AB

## 2022-01-23 MED ORDER — AZITHROMYCIN 250 MG PO TABS
1000.0000 mg | ORAL_TABLET | Freq: Once | ORAL | Status: AC
  Administered 2022-01-23: 1000 mg via ORAL
  Filled 2022-01-23: qty 4

## 2022-01-23 NOTE — ED Notes (Signed)
Patient verbalizes understanding of discharge instructions. Opportunity for questioning and answers were provided. Armband removed by staff, pt discharged from ED. Ambulated out to lobby  

## 2022-01-23 NOTE — Discharge Instructions (Addendum)
You were seen in the emergency department for abdominal pain and vaginal bleeding along with nausea.  Your pregnancy test was positive.  This is likely a very early pregnancy.  You should stop the doxycycline.  Tylenol as needed for pain.  You will need repeat blood work in 2 days [facility] to see if the pregnancy is progressing properly.  Return to the emergency department if any worsening or concerning symptoms

## 2022-01-23 NOTE — ED Triage Notes (Signed)
Pt reports lower abdominal pain, onset 2 days ago. Nausea started today. She states she took a home pregnancy test and it was positive. She was at [facility] on 1/31 with same complaint and pregnancy test was negative. She states "something isnt right."

## 2022-01-23 NOTE — ED Provider Notes (Signed)
Kylie Hicks Provider Note   CSN: [id number] Arrival date & time: 01/23/22  1952     History  Chief Complaint  Patient presents with   Abdominal Pain    Kylie Hicks is a 36 y.o. female.  She is here with a couple of days of nausea and some vaginal spotting which has resolved.  Still having some lower abdominal pain.  She was recently at [facility] long, had a negative pregnancy test and an ultrasound that showed a uterine fibroid.  She tested positive for chlamydia and was placed on doxycycline.  She also was positive for BV and given Flagyl.  She has probably completed 6 or 7 days worth of medication.  Has not had any vaginal discharge.  The history is provided by the patient.  Abdominal Pain Pain location:  Suprapubic Pain quality: cramping   Pain radiates to:  Does not radiate Pain severity:  Moderate Onset quality:  Gradual Duration:  7 days Timing:  Intermittent Progression:  Unchanged Chronicity:  New Relieved by:  Nothing Worsened by:  Nothing Ineffective treatments:  None tried Associated symptoms: nausea and vaginal bleeding   Associated symptoms: no chest pain, no constipation, no cough, no diarrhea, no dysuria, no fever, no shortness of breath, no sore throat, no vaginal discharge and no vomiting       Home Medications Prior to Admission medications   Medication Sig Start Date End Date Taking? Authorizing Provider  acetaminophen (TYLENOL) 500 MG tablet Take 2 tablets (1,000 mg total) by mouth every 6 (six) hours as needed for moderate pain. 10/19/18   Tamala Julian, [place], CNM  fluticasone (FLONASE) 50 MCG/ACT nasal spray Place 2 sprays into both nostrils daily. 12/01/21   Couture, Cortni S, PA-C  guaiFENesin (MUCINEX) 600 MG 12 hr tablet Take 1 tablet (600 mg total) by mouth 2 (two) times daily. 12/01/21   Couture, Cortni S, PA-C  hydrochlorothiazide (HYDRODIURIL) 25 MG tablet Take 1 tablet (25 mg total) by mouth daily. 10/19/18   Tamala Julian,  [place], CNM  hydrochlorothiazide (HYDRODIURIL) 25 MG tablet Take 1 tablet (25 mg total) by mouth daily. 12/01/21 12/31/21  Couture, Cortni S, PA-C  ketorolac (TORADOL) 10 MG tablet Take 1 tablet (10 mg total) by mouth every 6 (six) hours as needed. 10/19/18   Tamala Julian, [place], CNM  metroNIDAZOLE (FLAGYL) 500 MG tablet Take 1 tablet (500 mg total) by mouth 2 (two) times daily for 7 days. 01/16/22 01/23/22  Regan Lemming, MD  ondansetron (ZOFRAN ODT) 4 MG disintegrating tablet 4mg  ODT q4 hours prn nausea/vomit 07/11/19   Jacqlyn Larsen, PA-C      Allergies    Patient has no known allergies.    Review of Systems   Review of Systems  Constitutional:  Negative for fever.  HENT:  Negative for sore throat.   Respiratory:  Negative for cough and shortness of breath.   Cardiovascular:  Negative for chest pain.  Gastrointestinal:  Positive for abdominal pain and nausea. Negative for constipation, diarrhea and vomiting.  Genitourinary:  Positive for vaginal bleeding. Negative for dysuria and vaginal discharge.  Musculoskeletal:  Negative for back pain.  Skin:  Negative for rash.   Physical Exam Updated Vital Signs BP (!) 158/110    Pulse 63    Temp 98.9 F (37.2 C) (Oral)    Resp 18    Ht 5\' 3"  (1.6 m)    Wt 85.7 kg    LMP 12/25/2021 (Exact Date)    SpO2 100%  BMI 33.48 kg/m  Physical Exam Vitals and nursing note reviewed.  Constitutional:      General: She is not in acute distress.    Appearance: Normal appearance. She is well-developed.  HENT:     Head: Normocephalic and atraumatic.  Eyes:     Conjunctiva/sclera: Conjunctivae normal.  Cardiovascular:     Rate and Rhythm: Normal rate and regular rhythm.     Heart sounds: No murmur heard. Pulmonary:     Effort: Pulmonary effort is normal. No respiratory distress.     Breath sounds: Normal breath sounds.  Abdominal:     Palpations: Abdomen is soft. There is no mass.     Tenderness: There is no abdominal tenderness. There is no guarding  or rebound.  Musculoskeletal:        General: No swelling.     Cervical back: Neck supple.  Skin:    General: Skin is warm and dry.     Capillary Refill: Capillary refill takes less than 2 seconds.  Neurological:     General: No focal deficit present.     Mental Status: She is alert.  Psychiatric:        Mood and Affect: Mood normal.    ED Results / Procedures / Treatments   Labs (all labs ordered are listed, but only abnormal results are displayed) Labs Reviewed  COMPREHENSIVE METABOLIC PANEL - Abnormal; Notable for the following components:      Result Value   Potassium 3.3 (*)    Creatinine, Ser 1.03 (*)    All other components within normal limits  CBC - Abnormal; Notable for the following components:   Hemoglobin 11.8 (*)    RDW 15.8 (*)    Platelets 419 (*)    All other components within normal limits  HCG, QUANTITATIVE, PREGNANCY - Abnormal; Notable for the following components:   hCG, Beta Chain, Quant, S 111 (*)    All other components within normal limits  HCG, SERUM, QUALITATIVE - Abnormal; Notable for the following components:   Preg, Serum POSITIVE (*)    All other components within normal limits  URINALYSIS, ROUTINE W REFLEX MICROSCOPIC - Abnormal; Notable for the following components:   Hgb urine dipstick TRACE (*)    Bilirubin Urine SMALL (*)    Ketones, ur 15 (*)    All other components within normal limits  URINALYSIS, MICROSCOPIC (REFLEX) - Abnormal; Notable for the following components:   Bacteria, UA RARE (*)    All other components within normal limits  LIPASE, BLOOD    EKG None  Radiology No results found.  Procedures Procedures    Medications Ordered in ED Medications - No data to display  ED Course/ Medical Decision Making/ A&P Clinical Course as of 01/24/22 1443  Tue Jan 23, 2022  2240 Had a long discussion with patient regarding her positive pregnancy test.  She was clearly negative while at [facility] last week and treated for  chlamydia with doxycycline.  She said she just started the medication.  This is a class D medication.  Unclear if her presentation today is a threatened miscarriage or an early pregnancy.  Will need serial quant.  Discussed with Dr.Constant GYN at the [facility].  She is recommending DC the doxycycline, give single dose Zithromax and have her follow-up in 2 days in the clinic for repeat quant. [MB]    Clinical Course User Index [MB] Hayden Rasmussen, MD  Medical Decision Making Amount and/or Complexity of Data Reviewed Labs: ordered.  Risk Prescription drug management.  This patient complains of lower abdominal pain vaginal bleeding positive home pregnancy test; this involves an extensive number of treatment Options and is a complaint that carries with it a high risk of complications and Morbidity. The differential includes threatened miscarriage, ectopic, infection, diverticulitis, colitis, kidney stone, appendicitis  I ordered, reviewed and interpreted labs, which included CBC with normal white count, hemoglobin low stable from priors, chemistries with mildly low potassium, LFTs normal urinalysis without signs of infection, pregnancy test positive beta quant at 111  Previous records obtained and reviewed in epic.  Patient been seen about a week ago for lower abdominal pain and treated for PID with doxycycline, ended up being positive for chlamydia and BV.  Pregnancy test positive here now.  Will need to stop doxycycline and adjust to Zithromax.  Will need serial quant's to ascertain whether pregnancy is viable. I consulted Dr. Elly Modena gynecology and discussed lab and imaging findings  After the interventions stated above, I reevaluated the patient and found patient to have a benign abdominal exam.  Reviewed results of work-up with her.  No indications for pelvic ultrasound at this time as had 1 within the week.  Quant too low to be concern for ectopic at this time.   Reviewed results of work-up with patient including gynecology recommendations.  No indications for admission at this time.  Return instructions discussed.          Final Clinical Impression(s) / ED Diagnoses Final diagnoses:  Pelvic pain in female  Early stage of pregnancy    Rx / DC Orders ED Discharge Orders     None         Hayden Rasmussen, MD 01/24/22 [phone]

## 2022-01-25 ENCOUNTER — Other Ambulatory Visit: Payer: Self-pay

## 2022-01-25 ENCOUNTER — Ambulatory Visit (INDEPENDENT_AMBULATORY_CARE_PROVIDER_SITE_OTHER): Payer: Managed Care, Other (non HMO)

## 2022-01-25 DIAGNOSIS — R102 Pelvic and perineal pain: Secondary | ICD-10-CM

## 2022-01-25 DIAGNOSIS — O26899 Other specified pregnancy related conditions, unspecified trimester: Secondary | ICD-10-CM

## 2022-01-25 LAB — BETA HCG QUANT (REF LAB): hCG Quant: 106 m[IU]/mL

## 2022-01-25 NOTE — Progress Notes (Signed)
Results back and reviewed with Dr Rip Harbour. Pt advised to have follow up in 1 week for non stat beta and ectopic precautions given. Pt called and given results and follow up recommendations. Pt verbalized understanding. Pt scheduled for lab on 2/15 at 1:50pm per pts request. Pt agreeable to date and time of appt.  Colletta Maryland, RN

## 2022-01-25 NOTE — Progress Notes (Signed)
Pt here today for STAT Beta from [facility] follow up on 01/23/22. Pt states only having brown discharge "gush" times once yesterday and nothing today. Denies any IC.  Pt denies any vaginal bleeding, pain or cramps today. Pt advised after results come back from Beta, will be contacted via phone with plan of care per Dr Rip Harbour. Pt verbalized understanding.    Shelda Pal

## 2022-01-31 ENCOUNTER — Encounter (HOSPITAL_COMMUNITY): Payer: Self-pay

## 2022-01-31 ENCOUNTER — Other Ambulatory Visit: Payer: Managed Care, Other (non HMO)

## 2022-01-31 ENCOUNTER — Emergency Department (HOSPITAL_COMMUNITY)
Admission: EM | Admit: 2022-01-31 | Discharge: 2022-01-31 | Disposition: A | Discharge status: Home / Self Care | Payer: Commercial Managed Care - HMO | Attending: Emergency Medicine | Admitting: Emergency Medicine

## 2022-01-31 ENCOUNTER — Emergency Department (HOSPITAL_COMMUNITY): Payer: Commercial Managed Care - HMO

## 2022-01-31 DIAGNOSIS — O26891 Other specified pregnancy related conditions, first trimester: Secondary | ICD-10-CM | POA: Diagnosis present

## 2022-01-31 DIAGNOSIS — R109 Unspecified abdominal pain: Secondary | ICD-10-CM | POA: Insufficient documentation

## 2022-01-31 DIAGNOSIS — N898 Other specified noninflammatory disorders of vagina: Secondary | ICD-10-CM | POA: Insufficient documentation

## 2022-01-31 DIAGNOSIS — Z3A01 Less than 8 weeks gestation of pregnancy: Secondary | ICD-10-CM | POA: Diagnosis not present

## 2022-01-31 LAB — CBC WITH DIFFERENTIAL/PLATELET
Abs Immature Granulocytes: 0.02 10*3/uL (ref 0.00–0.07)
Basophils Absolute: 0 10*3/uL (ref 0.0–0.1)
Basophils Relative: 0 %
Eosinophils Absolute: 0.1 10*3/uL (ref 0.0–0.5)
Eosinophils Relative: 2 %
HCT: 36.8 % (ref 36.0–46.0)
Hemoglobin: 12 g/dL (ref 12.0–15.0)
Immature Granulocytes: 0 %
Lymphocytes Relative: 29 %
Lymphs Abs: 2.1 10*3/uL (ref 0.7–4.0)
MCH: 26.7 pg (ref 26.0–34.0)
MCHC: 32.6 g/dL (ref 30.0–36.0)
MCV: 82 fL (ref 80.0–100.0)
Monocytes Absolute: 0.6 10*3/uL (ref 0.1–1.0)
Monocytes Relative: 8 %
Neutro Abs: 4.3 10*3/uL (ref 1.7–7.7)
Neutrophils Relative %: 61 %
Platelets: 367 10*3/uL (ref 150–400)
RBC: 4.49 MIL/uL (ref 3.87–5.11)
RDW: 16 % — ABNORMAL HIGH (ref 11.5–15.5)
WBC: 7.1 10*3/uL (ref 4.0–10.5)
nRBC: 0 % (ref 0.0–0.2)

## 2022-01-31 LAB — COMPREHENSIVE METABOLIC PANEL
ALT: 18 U/L (ref 0–44)
AST: 21 U/L (ref 15–41)
Albumin: 3.6 g/dL (ref 3.5–5.0)
Alkaline Phosphatase: 81 U/L (ref 38–126)
Anion gap: 6 (ref 5–15)
BUN: 11 mg/dL (ref 6–20)
CO2: 24 mmol/L (ref 22–32)
Calcium: 8.8 mg/dL — ABNORMAL LOW (ref 8.9–10.3)
Chloride: 106 mmol/L (ref 98–111)
Creatinine, Ser: 0.75 mg/dL (ref 0.44–1.00)
GFR, Estimated: >60 mL/min (ref >60)
Glucose, Bld: 81 mg/dL (ref 70–99)
Potassium: 3.2 mmol/L — ABNORMAL LOW (ref 3.5–5.1)
Sodium: 136 mmol/L (ref 135–145)
Total Bilirubin: 0.2 mg/dL — ABNORMAL LOW (ref 0.3–1.2)
Total Protein: 6.8 g/dL (ref 6.5–8.1)

## 2022-01-31 LAB — ABO/RH: ABO/RH(D): O POS

## 2022-01-31 LAB — LIPASE, BLOOD: Lipase: 29 U/L (ref 11–51)

## 2022-01-31 LAB — HCG, QUANTITATIVE, PREGNANCY: hCG, Beta Chain, Quant, S: 890 m[IU]/mL — ABNORMAL HIGH (ref <5)

## 2022-01-31 NOTE — ED Triage Notes (Signed)
Pt arrived via POV, c/o diffuse abd pain and brown vaginal discharge since this morning. States currently pregnant, too early for any U/s yet.

## 2022-01-31 NOTE — Discharge Instructions (Signed)
The OBGYN office will call you for a follow-up appointment.  You can take Tylenol for pain.  Apply ice and heat.  Go to the Women's and [place] at [facility] for new or worsening symptoms.

## 2022-01-31 NOTE — ED Provider Notes (Signed)
[facility] Emergency Department Provider Note MRN:  [id number]  Arrival date & time: 01/31/22     Chief Complaint   Abdominal Pain   History of Present Illness   Kylie Hicks is a 36 y.o. year-old female presents to the ED with chief complaint of abdominal pain and vaginal discharge.  She was seen approximately 1 week ago for similar and was found to be pregnant.  Had recent treatment for chlamydia.  She was supposed to follow-up for repeat hCG today.  Denies seeing any vaginal bleeding.    Review of Systems  Pertinent review of systems noted in HPI.    Physical Exam   Vitals:   01/31/22 1208 01/31/22 1244  BP: 127/90 (!) 140/97  Pulse: 80 79  Resp: 17 17  Temp:    SpO2: 100% 100%    CONSTITUTIONAL:  nontoxic-appearing, NAD NEURO:  Alert and oriented x 3, CN 3-12 grossly intact EYES:  eyes equal and reactive ENT/NECK:  Supple, no stridor  CARDIO:  normal rate, appears well-perfused  PULM:  No respiratory distress,  GI/GU:  non-distended, generalized abdominal discomfort MSK/SPINE:  No gross deformities, no edema, moves all extremities  SKIN:  no rash, atraumatic   *Additional and/or pertinent findings included in MDM below  Diagnostic and Interventional Summary    EKG Interpretation  Date/Time:    Ventricular Rate:    PR Interval:    QRS Duration:   QT Interval:    QTC Calculation:   R Axis:     Text Interpretation:         Labs Reviewed  HCG, QUANTITATIVE, PREGNANCY - Abnormal; Notable for the following components:      Result Value   hCG, Beta Chain, Quant, S 890 (*)    All other components within normal limits  COMPREHENSIVE METABOLIC PANEL - Abnormal; Notable for the following components:   Potassium 3.2 (*)    Calcium 8.8 (*)    Total Bilirubin 0.2 (*)    All other components within normal limits  CBC WITH DIFFERENTIAL/PLATELET - Abnormal; Notable for the following components:   RDW 16.0 (*)    All other  components within normal limits  LIPASE, BLOOD  URINALYSIS, ROUTINE W REFLEX MICROSCOPIC  ABO/RH    US OB Comp < 14 Wks  Final Result      Medications - No data to display   Procedures  /  Critical Care Procedures  ED Course and Medical Decision Making  I have reviewed the triage vital signs, the nursing notes, and pertinent available records from the EMR.  Complexity of Problems Addressed: High Complexity: Acute illness/injury posing a threat to life or bodily function, requiring emergent diagnostic workup, evaluation, and treatment as below.  ED Course:    After considering the following differential, ectopic pregnancy, ovarian torsion, TOA, kidney stone, appendicitis, I agree with the tests ordered in triage including pelvic ultrasound and hCG.  I visualized the [place] which shows no IUP and I agree with radiologist interpretation.    Consultants: I discussed the case with Dr. Harolyn Rutherford, who recommends outpatient followup.  States that HCG is trending up appropriately, but is still below the value that we'd expect to see IUP on [place].  Will have the office contact the patient for a follow-up visit.   Treatment and Plan:  Advised treatment with Tylenol, ice, and heat.  Recommended the patient follow-up with OB/GYN.  She is agreeable with this plan  I considered admission due to patient's initial  presentation, but after considering the examination and diagnostic results, patient will not require admission and can be discharged with outpatient follow-up.    Final Clinical Impressions(s) / ED Diagnoses     ICD-10-CM   1. Abdominal pain during pregnancy in first trimester  O26.891    R10.9       ED Discharge Orders     None        Discharge Instructions Discussed with and Provided to Patient:    Discharge Instructions      The OBGYN office will call you for a follow-up appointment.  You can take Tylenol for pain.  Apply ice and heat.  Go to the Women's and  [place] at [facility] for new or worsening symptoms.      Montine Circle, PA-C 01/31/22 1306    Margette Fast, MD 02/01/22 [phone]

## 2022-01-31 NOTE — ED Provider Triage Note (Signed)
Emergency Medicine Provider Triage Evaluation Note  Kylie Hicks , a 36 y.o. female  was evaluated in triage.  Pt complains of abdominal pain, vaginal discharge, and fevers.  Patient reports that she has been having vaginal discharge over the last 4 days.  Patient describes vaginal discharge as brown.  Patient has been having fevers over the last 4 days as well.  States that Tmax was 99.8 F 2 days prior.  Patient reports that over the last 4 days she has also been having generalized abdominal pain.  Pain is worse to her lower quadrant however spreads throughout her entire abdomen.  Associated nausea and vomiting is present.  Patient describes emesis as stomach contents.  Patient reports that she is currently taking medication to treat for BV.  Per chart review patient was recently treated for PID.  Review of Systems  Positive: Vaginal discharge, abdominal pain, nausea, vomiting, fevers Negative: Vaginal pain, vaginal bleeding, dysuria, hematuria, urinary urgency  Physical Exam  BP (!) 137/104 (BP Location: Left Arm)    Pulse 86    Temp 98.2 F (36.8 C) (Oral)    Resp 16    SpO2 99%  Gen:   Awake, no distress   Resp:  Normal effort  MSK:   Moves extremities without difficulty  Other:  Abdomen soft, nondistended, minimal tenderness throughout, no guarding or rebound tenderness.  Medical Decision Making  Medically screening exam initiated at 9:04 AM.  Appropriate orders placed.  SELISA TENSLEY was informed that the remainder of the evaluation will be completed by another provider, this initial triage assessment does not replace that evaluation, and the importance of remaining in the ED until their evaluation is complete.  We will obtain work-up for ectopic pregnancy.  Patient made level 2 and will be moved back to next available room.   Loni Beckwith, [place] 01/31/22 [phone]

## 2022-02-03 ENCOUNTER — Inpatient Hospital Stay (HOSPITAL_COMMUNITY)
Admission: AD | Admit: 2022-02-03 | Discharge: 2022-02-04 | Disposition: A | Discharge status: Home / Self Care | Payer: Managed Care, Other (non HMO) | Attending: Obstetrics & Gynecology | Admitting: Obstetrics & Gynecology

## 2022-02-03 ENCOUNTER — Other Ambulatory Visit: Payer: Self-pay

## 2022-02-03 ENCOUNTER — Encounter (HOSPITAL_COMMUNITY): Payer: Self-pay | Admitting: Obstetrics & Gynecology

## 2022-02-03 ENCOUNTER — Inpatient Hospital Stay (HOSPITAL_COMMUNITY): Payer: Managed Care, Other (non HMO)

## 2022-02-03 DIAGNOSIS — O09521 Supervision of elderly multigravida, first trimester: Secondary | ICD-10-CM | POA: Insufficient documentation

## 2022-02-03 DIAGNOSIS — R1032 Left lower quadrant pain: Secondary | ICD-10-CM | POA: Insufficient documentation

## 2022-02-03 DIAGNOSIS — O00102 Left tubal pregnancy without intrauterine pregnancy: Secondary | ICD-10-CM | POA: Insufficient documentation

## 2022-02-03 DIAGNOSIS — Z3A01 Less than 8 weeks gestation of pregnancy: Secondary | ICD-10-CM | POA: Insufficient documentation

## 2022-02-03 DIAGNOSIS — O0942 Supervision of pregnancy with grand multiparity, second trimester: Secondary | ICD-10-CM | POA: Insufficient documentation

## 2022-02-03 DIAGNOSIS — R109 Unspecified abdominal pain: Secondary | ICD-10-CM

## 2022-02-03 DIAGNOSIS — O26891 Other specified pregnancy related conditions, first trimester: Secondary | ICD-10-CM | POA: Diagnosis present

## 2022-02-03 DIAGNOSIS — O200 Threatened abortion: Secondary | ICD-10-CM

## 2022-02-03 LAB — I-STAT BETA HCG BLOOD, ED (MC, WL, AP ONLY): I-stat hCG, quantitative: 816.1 m[IU]/mL — ABNORMAL HIGH (ref <5)

## 2022-02-03 LAB — WET PREP, GENITAL
Sperm: NONE SEEN
Trich, Wet Prep: NONE SEEN
WBC, Wet Prep HPF POC: >=10 — AB (ref <10)
Yeast Wet Prep HPF POC: NONE SEEN

## 2022-02-03 LAB — HCG, QUANTITATIVE, PREGNANCY: hCG, Beta Chain, Quant, S: 1071 m[IU]/mL — ABNORMAL HIGH (ref <5)

## 2022-02-03 MED ORDER — ACETAMINOPHEN 325 MG PO TABS
650.0000 mg | ORAL_TABLET | Freq: Four times a day (QID) | ORAL | Status: DC | PRN
  Administered 2022-02-04: 650 mg via ORAL
  Filled 2022-02-03: qty 2

## 2022-02-03 MED ORDER — CYCLOBENZAPRINE HCL 5 MG PO TABS
10.0000 mg | ORAL_TABLET | Freq: Once | ORAL | Status: AC
  Administered 2022-02-04: 10 mg via ORAL
  Filled 2022-02-03: qty 2

## 2022-02-03 NOTE — ED Provider Triage Note (Signed)
Emergency Medicine Provider OB Triage Evaluation Note  Kylie Hicks is a 36 y.o. female, [id number], at Unknown gestation who presents to the emergency department with complaints of abdominal pain, brown, mixed with blood vaginal discharge.  Seen 3 days ago at [facility].  HCG has been trending, but no IUP confirmed on [place] do to suspected young gestational age.  Review of  Systems  Positive: vaginal discharge and bleeding, lower abdominal pain Negative: fever, chills  Physical Exam  BP (!) 151/106    Pulse 88    Temp 98.5 F (36.9 C) (Oral)    Resp 16    Ht 5\' 3"  (1.6 m)    Wt 89.8 kg    SpO2 100%    BMI 35.07 kg/m  General: Awake, no distress  HEENT: Atraumatic  Resp: Normal effort  Cardiac: Normal rate Abd: Nondistended, nontender  MSK: Moves all extremities without difficulty Neuro: Speech clear  Medical Decision Making  Pt evaluated for pregnancy concern and is stable for transfer to MAU. Pt is in agreement with plan for transfer.  10:02 PM Discussed with MAU APP, Janett Billow, who accepts patient in transfer.  Clinical Impression   1. Threatened miscarriage        Montine Circle, Hershal Coria 02/03/22 2206

## 2022-02-03 NOTE — ED Triage Notes (Signed)
Pt reports she is having a miscarriage. Pt states she is [redacted] weeks pregnant.

## 2022-02-03 NOTE — MAU Provider Note (Signed)
History     CSN: [id number]  Arrival date and time: 02/03/22 2144   Event Date/Time   First Provider Initiated Contact with Patient 02/03/22 2314      Chief Complaint  Patient presents with   Vaginal Bleeding   Abdominal Pain   ALLEAH Hicks is a 36 y.o. [id number] at [redacted]w[redacted]d by definite LMP of Jan 9th, 2023.  She presents today for Vaginal Bleeding and Abdominal Pain.  She states she has been having brown discharge for one week, but the flow requires usage of a pad.  She states initially it was "small amounts" on the pad, but now the pad is "sometimes" full.  She reports she was treated for BV and CT last month. She reports abdominal pain that she describes as "dull" in the morning, but "sharp" throughout the day and located in her LLQ. She also reports some abdominal swelling that is present in the morning and resolves throughout the day, but causes pelvic pain.  She states she has taken tylenol without improvement for her pain.    OB History     Gravida  12   Para  8   Term  7   Preterm  1   AB  2   Living  8      SAB  1   IAB  1   Ectopic  0   Multiple  0   Live Births  8           Past Medical History:  Diagnosis Date   Anemia    with pregnancies   Chlamydia    Heart murmur    History of preterm labor, current pregnancy    Hypertension    Hypothyroidism    No meds   Incompetent cervix    cerclage   Postpartum depression 2006    Past Surgical History:  Procedure Laterality Date   CERVICAL CERCLAGE     CERVICAL CERCLAGE N/A 02/18/2015   Procedure: CERCLAGE CERVICAL;  Surgeon: Woodroe Mode, MD;  Location: [facility];  Service: Gynecology;  Laterality: N/A;   PILONIDAL CYST / SINUS EXCISION      Family History  Problem Relation Age of Onset   Hypertension Mother    Cancer Sister     Social History   Tobacco Use   Smoking status: Every Day    Packs/day: 0.25    Years: 3.00    Pack years: 0.75    Types: Cigarettes   Smokeless tobacco:  Never  Vaping Use   Vaping Use: Never used  Substance Use Topics   Alcohol use: No    Alcohol/week: 0.0 standard drinks   Drug use: Yes    Types: Marijuana    Allergies: No Known Allergies  Medications Prior to Admission  Medication Sig Dispense Refill Last Dose   acetaminophen (TYLENOL) 500 MG tablet Take 2 tablets (1,000 mg total) by mouth every 6 (six) hours as needed for moderate pain. 30 tablet 0    fluticasone (FLONASE) 50 MCG/ACT nasal spray Place 2 sprays into both nostrils daily. 16 g 0    guaiFENesin (MUCINEX) 600 MG 12 hr tablet Take 1 tablet (600 mg total) by mouth 2 (two) times daily. 6 tablet 0    hydrochlorothiazide (HYDRODIURIL) 25 MG tablet Take 1 tablet (25 mg total) by mouth daily. 30 tablet 3    hydrochlorothiazide (HYDRODIURIL) 25 MG tablet Take 1 tablet (25 mg total) by mouth daily. 30 tablet 0    ketorolac (TORADOL) 10  MG tablet Take 1 tablet (10 mg total) by mouth every 6 (six) hours as needed. 20 tablet 0    ondansetron (ZOFRAN ODT) 4 MG disintegrating tablet 4mg  ODT q4 hours prn nausea/vomit 10 tablet 0     Review of Systems  Gastrointestinal:  Positive for abdominal pain and nausea. Negative for vomiting.  Genitourinary:  Positive for vaginal bleeding and vaginal discharge. Negative for difficulty urinating and dysuria.  Neurological:  Negative for dizziness, light-headedness and headaches.  Physical Exam   Blood pressure (!) 148/97, pulse 84, temperature 98.5 F (36.9 C), temperature source Oral, resp. rate 16, height 5\' 3"  (1.6 m), weight 89.8 kg, last menstrual period 12/25/2021, SpO2 100 %, unknown if currently breastfeeding.  Physical Exam Constitutional:      General: She is not in acute distress.    Appearance: Normal appearance. She is well-developed. She is not toxic-appearing.  HENT:     Head: Normocephalic and atraumatic.  Eyes:     Conjunctiva/sclera: Conjunctivae normal.  Cardiovascular:     Rate and Rhythm: Normal rate and regular  rhythm.     Heart sounds: Normal heart sounds.  Pulmonary:     Effort: Pulmonary effort is normal. No respiratory distress.     Breath sounds: Normal breath sounds.  Abdominal:     General: Abdomen is protuberant. Bowel sounds are normal.     Palpations: Abdomen is soft.     Tenderness: There is abdominal tenderness in the suprapubic area and left lower quadrant. There is no guarding or rebound.  Musculoskeletal:        General: Normal range of motion.     Cervical back: Normal range of motion.  Skin:    General: Skin is dry.  Neurological:     Mental Status: She is alert and oriented to person, place, and time.  Psychiatric:        Mood and Affect: Mood normal.        Thought Content: Thought content normal.    MAU Course  Procedures Results for orders placed or performed during the hospital encounter of 02/03/22 (from the past 24 hour(s))  I-Stat beta hCG blood, ED     Status: Abnormal   Collection Time: 02/03/22 10:00 PM  Result Value Ref Range   I-stat hCG, quantitative 816.1 (H) <5 mIU/mL   Comment 3          Wet prep, genital     Status: Abnormal   Collection Time: 02/03/22 10:26 PM   Specimen: PATH Cytology Cervicovaginal Ancillary Only  Result Value Ref Range   Yeast Wet Prep HPF POC NONE SEEN NONE SEEN   Trich, Wet Prep NONE SEEN NONE SEEN   Clue Cells Wet Prep HPF POC PRESENT (A) NONE SEEN   WBC, Wet Prep HPF POC >=10 (A) <10   Sperm NONE SEEN   hCG, quantitative, pregnancy     Status: Abnormal   Collection Time: 02/03/22 10:36 PM  Result Value Ref Range   hCG, Beta Chain, Quant, S 1,071 (H) <5 mIU/mL  Comprehensive metabolic panel     Status: Abnormal   Collection Time: 02/04/22  1:17 AM  Result Value Ref Range   Sodium 137 135 - 145 mmol/L   Potassium 3.2 (L) 3.5 - 5.1 mmol/L   Chloride 104 98 - 111 mmol/L   CO2 25 22 - 32 mmol/L   Glucose, Bld 104 (H) 70 - 99 mg/dL   BUN 10 6 - 20 mg/dL   Creatinine, Ser 1.11 (H)  0.44 - 1.00 mg/dL   Calcium 8.8 (L) 8.9  - 10.3 mg/dL   Total Protein 6.3 (L) 6.5 - 8.1 g/dL   Albumin 3.2 (L) 3.5 - 5.0 g/dL   AST 20 15 - 41 U/L   ALT 19 0 - 44 U/L   Alkaline Phosphatase 79 38 - 126 U/L   Total Bilirubin 0.2 (L) 0.3 - 1.2 mg/dL   GFR, Estimated >60 >60 mL/min   Anion gap 8 5 - 15  CBC WITH DIFFERENTIAL     Status: Abnormal   Collection Time: 02/04/22  1:17 AM  Result Value Ref Range   WBC 9.3 4.0 - 10.5 K/uL   RBC 4.07 3.87 - 5.11 MIL/uL   Hemoglobin 10.6 (L) 12.0 - 15.0 g/dL   HCT 33.5 (L) 36.0 - 46.0 %   MCV 82.3 80.0 - 100.0 fL   MCH 26.0 26.0 - 34.0 pg   MCHC 31.6 30.0 - 36.0 g/dL   RDW 16.0 (H) 11.5 - 15.5 %   Platelets 339 150 - 400 K/uL   nRBC 0.0 0.0 - 0.2 %   Neutrophils Relative % 60 %   Neutro Abs 5.5 1.7 - 7.7 K/uL   Lymphocytes Relative 29 %   Lymphs Abs 2.7 0.7 - 4.0 K/uL   Monocytes Relative 10 %   Monocytes Absolute 0.9 0.1 - 1.0 K/uL   Eosinophils Relative 1 %   Eosinophils Absolute 0.1 0.0 - 0.5 K/uL   Basophils Relative 0 %   Basophils Absolute 0.0 0.0 - 0.1 K/uL   Immature Granulocytes 0 %   Abs Immature Granulocytes 0.03 0.00 - 0.07 K/uL   US OB Transvaginal  Result Date: 02/04/2022 CLINICAL DATA:  Left lower quadrant pain and vaginal spotting. EXAM: OBSTETRIC <14 WK [place] AND TRANSVAGINAL OB US TECHNIQUE: Both transabdominal and transvaginal ultrasound examinations were performed for complete evaluation of the gestation as well as the maternal uterus, adnexal regions, and pelvic cul-de-sac. Transvaginal technique was performed to assess early pregnancy. COMPARISON:  January 31, 2022 FINDINGS: Intrauterine gestational sac: None Yolk sac:  Not Visualized. Embryo:  Not Visualized. Cardiac Activity: Not Visualized. Heart Rate: N/A  bpm Subchorionic hemorrhage:  None visualized. Maternal uterus/adnexae: A 1.4 cm x 1.1 cm x 1.1 cm heterogeneous fibroid is seen within the uterus on the right. The endometrium is thickened (16.8 mm) and heterogeneous in appearance. The right ovary measures  4.1 cm x 2.0 cm x 1.9 cm and is otherwise normal in appearance. The left ovary measures 4.0 cm x 2.6 cm x 2.1 cm, with an adjacent 1.6 cm by 1.2 cm x 1.0 cm heterogeneous left adnexal mass noted. A surrounding hypervascular rim is noted on color Doppler evaluation. A small amount of nonhemorrhagic pelvic free fluid is noted. IMPRESSION: 1. Thickened, heterogeneous endometrium, without evidence of an intrauterine pregnancy. 2. Additional findings suspicious for a LEFT ectopic pregnancy. Correlation with follow-up pelvic ultrasound and serial beta HCG levels is recommended. Electronically Signed   By: Virgina Norfolk M.D.   On: 02/04/2022 00:46    MDM Exam Labs: hCG, CBC, CMP Pain medication Ultrasound Consult Assessment and Plan  36 year old [id number] at [redacted]W[redacted]D LLQ Pain Vaginal Bleeding  -POC reviewed. -Informed we will start with hCG for comparison. -Discussed modifying POC as results return. -Labs ordered. -Patient to collect cultures via self swab. -Patient offered and accepts pain medication. -Will give Flexeril and Tylenol. -Results pending.  Maryann Conners 02/03/2022, 11:14 PM   Reassessment (12:04 AM) -hCG returns without significant  increase. -Exam performed. -Reviewed concern for ectopic pregnancy. -Discussed recommendation for repeat ultrasound. -Addressed patient questions regarding treatment if ectopic is found. -Patient instructed to remain n.p.o. -Patient agreeable without further questions -Will send for TVUS and await results.  Reassessment (12:55 AM) -Dr. Kristeen Miss calls and reports ectopic pregnancy.  States located on left side with no signs of rupture and small amount of pelvic fluid. -Provider to bedside to discuss results with patient. -Discussed treatment with methotrexate and need for additional labs prior to initiation. -Reviewed potential risk, side effects, and education regarding methotrexate usage. -Patient verbalizes understanding and states able  to follow-up accordingly. -Orders for CBC, CMP, and methotrexate placed.   The risks of methotrexate were reviewed including failure requiring repeat dosing or eventual surgery. She understands that methotrexate involves frequent return visits to monitor lab values and that she remains at risk of ectopic rupture until her beta is less than assay. ?The patient opts to proceed with methotrexate.  She has no history of hepatic or renal dysfunction, has normal BUN/Cr/LFT's/platelets.  She is felt to be reliable for follow-up. Side effects of photosensitivity & GI upset were discussed.  She knows to avoid direct sunlight and abstain from alcohol, NSAIDs and sexual intercourse for two weeks. She was counseled to discontinue any MVI with folic acid. ?She understands to follow up on D4 (Wednesday) and D7 (Saturday) for repeat BHCG and was given the instruction sheet. ?Strict ectopic precautions were reviewed, the patient knows to call with any abdominal pain, vomiting, fainting, or any concerns with her health.  Day 0/1 Day 4 Day 7  Sunday Wednesday Saturday  Monday Thursday Sunday  Tuesday Friday Monday  Wednesday Saturday Tuesday  Thursday Sunday Wednesday  Friday Monday Thursday  Saturday Tuesday Friday   Reassessment (2:12 AM) -Labs return as above. -Creatinine noted at 1.11. -Dr. Eddie North consulted and informed of patient status, evaluation, interventions, and results. Advised: *Okay to proceed with MTX dosing  Reassessment (3:02 AM) -S/P MTX dosing. -Provider to bedside to reiterate necessary lifestyle changes s/p MTX. -Discussed need to follow up on Wednesday Feb 22nd at [facility], then return to MAU on Saturday Feb 25th. -Appt for Monday Feb 20th changed to Wed Feb 22nd at 10am -Encouraged to call primary office or return to MAU if symptoms worsen or with the onset of new symptoms. -Discharged to home in stable condition.  Maryann Conners MSN, CNM Advanced Practice Provider, [facility] for  [employer] [facility]

## 2022-02-03 NOTE — MAU Note (Addendum)
Kylie Hicks is a 36 y.o. at [redacted]w[redacted]d here in MAU reporting: Pt having abdominal pain and vaginal bleeding and discharge for a week. Pt has had u/s done and MAU provider ordered bloodwork and vaginal swabs.  LMP:  Onset of complaint: 1 week  Pain score: 6/10 Vitals:   02/03/22 2149 02/03/22 2255  BP: (!) 151/106 (!) 148/97  Pulse: 88 84  Resp: 16 16  Temp: 98.5 F (36.9 C)   SpO2: 100% 100%     FHT:n/a Lab orders placed from triage:

## 2022-02-04 ENCOUNTER — Encounter (HOSPITAL_COMMUNITY): Payer: Self-pay | Admitting: Obstetrics & Gynecology

## 2022-02-04 ENCOUNTER — Inpatient Hospital Stay (HOSPITAL_COMMUNITY): Payer: Managed Care, Other (non HMO)

## 2022-02-04 DIAGNOSIS — O00102 Left tubal pregnancy without intrauterine pregnancy: Secondary | ICD-10-CM | POA: Diagnosis not present

## 2022-02-04 LAB — CBC WITH DIFFERENTIAL/PLATELET
Abs Immature Granulocytes: 0.03 10*3/uL (ref 0.00–0.07)
Basophils Absolute: 0 10*3/uL (ref 0.0–0.1)
Basophils Relative: 0 %
Eosinophils Absolute: 0.1 10*3/uL (ref 0.0–0.5)
Eosinophils Relative: 1 %
HCT: 33.5 % — ABNORMAL LOW (ref 36.0–46.0)
Hemoglobin: 10.6 g/dL — ABNORMAL LOW (ref 12.0–15.0)
Immature Granulocytes: 0 %
Lymphocytes Relative: 29 %
Lymphs Abs: 2.7 10*3/uL (ref 0.7–4.0)
MCH: 26 pg (ref 26.0–34.0)
MCHC: 31.6 g/dL (ref 30.0–36.0)
MCV: 82.3 fL (ref 80.0–100.0)
Monocytes Absolute: 0.9 10*3/uL (ref 0.1–1.0)
Monocytes Relative: 10 %
Neutro Abs: 5.5 10*3/uL (ref 1.7–7.7)
Neutrophils Relative %: 60 %
Platelets: 339 10*3/uL (ref 150–400)
RBC: 4.07 MIL/uL (ref 3.87–5.11)
RDW: 16 % — ABNORMAL HIGH (ref 11.5–15.5)
WBC: 9.3 10*3/uL (ref 4.0–10.5)
nRBC: 0 % (ref 0.0–0.2)

## 2022-02-04 LAB — COMPREHENSIVE METABOLIC PANEL
ALT: 19 U/L (ref 0–44)
AST: 20 U/L (ref 15–41)
Albumin: 3.2 g/dL — ABNORMAL LOW (ref 3.5–5.0)
Alkaline Phosphatase: 79 U/L (ref 38–126)
Anion gap: 8 (ref 5–15)
BUN: 10 mg/dL (ref 6–20)
CO2: 25 mmol/L (ref 22–32)
Calcium: 8.8 mg/dL — ABNORMAL LOW (ref 8.9–10.3)
Chloride: 104 mmol/L (ref 98–111)
Creatinine, Ser: 1.11 mg/dL — ABNORMAL HIGH (ref 0.44–1.00)
GFR, Estimated: >60 mL/min (ref >60)
Glucose, Bld: 104 mg/dL — ABNORMAL HIGH (ref 70–99)
Potassium: 3.2 mmol/L — ABNORMAL LOW (ref 3.5–5.1)
Sodium: 137 mmol/L (ref 135–145)
Total Bilirubin: 0.2 mg/dL — ABNORMAL LOW (ref 0.3–1.2)
Total Protein: 6.3 g/dL — ABNORMAL LOW (ref 6.5–8.1)

## 2022-02-04 MED ORDER — METHOTREXATE FOR ECTOPIC PREGNANCY
50.0000 mg/m2 | Freq: Once | INTRAMUSCULAR | Status: AC
  Administered 2022-02-04: 100 mg via INTRAMUSCULAR
  Filled 2022-02-04: qty 10

## 2022-02-04 NOTE — Discharge Instructions (Signed)
The risks of methotrexate were reviewed including failure requiring repeat dosing or eventual surgery. She understands that methotrexate involves frequent return visits to monitor lab values and that she remains at risk of ectopic rupture until her beta is less than assay. ?The patient opts to proceed with methotrexate.  She has no history of hepatic or renal dysfunction, has normal BUN/Cr/LFT's/platelets.  She is felt to be reliable for follow-up. Side effects of photosensitivity & GI upset were discussed.  She knows to avoid direct sunlight and abstain from alcohol, NSAIDs and sexual intercourse for two weeks. She was counseled to discontinue any MVI with folic acid.  ?She understands to follow up on D4 (Wednesday) and D7 (Saturday) for repeat BHCG and was given the instruction sheet. ?Strict ectopic precautions were reviewed, the patient knows to call with any abdominal pain, vomiting, fainting, or any concerns with her health.  Day 0/1 Day 4 Day 7  Sunday Wednesday Saturday  Monday Thursday Sunday  Tuesday Friday Monday  Wednesday Saturday Tuesday  Thursday Sunday Wednesday  Friday Monday Thursday  Saturday Tuesday Friday

## 2022-02-05 ENCOUNTER — Other Ambulatory Visit: Payer: Managed Care, Other (non HMO)

## 2022-02-05 LAB — GC/CHLAMYDIA PROBE AMP (~~LOC~~) NOT AT ARMC
Chlamydia: POSITIVE — AB
Comment: NEGATIVE
Comment: NORMAL
Neisseria Gonorrhea: NEGATIVE

## 2022-02-06 ENCOUNTER — Telehealth: Payer: Self-pay | Admitting: Family Medicine

## 2022-02-06 ENCOUNTER — Other Ambulatory Visit: Payer: Self-pay

## 2022-02-06 DIAGNOSIS — A749 Chlamydial infection, unspecified: Secondary | ICD-10-CM

## 2022-02-06 MED ORDER — AZITHROMYCIN 500 MG PO TABS: 1000.0000 mg | ORAL_TABLET | Freq: Once | ORAL | 0 refills | Status: AC

## 2022-02-06 NOTE — Telephone Encounter (Signed)
Returned patients call. Patient post Methotrexate on 2/19.   Patient reports she has some pressure in the vaginal area, mainly in the morning. She denies cramping. She is having some mild bleeding that comes a go. Reviewed this is a normal part of the process. .   She is aware she is still positive for Chlamydia and informed she needs to be retreated today. She reports that her partner was tested and she has not had any intercourse since being treated previously on the 2/07.   Patient has appointment tomorrow at 10 am for Hcg levels. Advised to keep appointment as provider needs to determine if any further treatment is needed. Patient voiced understanding.   Patient very interested in obtaining birth control once this is all over. Reviewed we would be glad to assist her with that and the she will need an virtual or in office appointment to discuss. Patient voiced understanding. Message sent to front office to call patient to schedule birth control appointment.

## 2022-02-06 NOTE — Telephone Encounter (Signed)
Patient is experiencing pressure in vaginal area but she stated it is different from cramps. She would like a nurse to give her a call back because she does not know the severity of it.

## 2022-02-07 ENCOUNTER — Other Ambulatory Visit: Payer: Self-pay

## 2022-02-07 ENCOUNTER — Ambulatory Visit (INDEPENDENT_AMBULATORY_CARE_PROVIDER_SITE_OTHER): Payer: Managed Care, Other (non HMO)

## 2022-02-07 VITALS — BP 136/96 | HR 71 | Wt 217.4 lb

## 2022-02-07 DIAGNOSIS — O3680X0 Pregnancy with inconclusive fetal viability, not applicable or unspecified: Secondary | ICD-10-CM

## 2022-02-07 DIAGNOSIS — R109 Unspecified abdominal pain: Secondary | ICD-10-CM

## 2022-02-07 LAB — BETA HCG QUANT (REF LAB): hCG Quant: 1195 m[IU]/mL

## 2022-02-07 NOTE — Patient Instructions (Addendum)
[facility] Appointment: 02/14/22 at 2:20 PM  Address: [address]ene[address] [place] [place], [place] [address] Phone: [phone]

## 2022-02-07 NOTE — Progress Notes (Signed)
Patient here today following up from MAU visit on 02/03/22 for abdominal pain and vaginal bleeding. Patient was administered methotrexate on 02/04/22 for ectopic pregnancy. Patient's hCG on 02/03/22 was 1,071. Patient's hCG today on day 4 is 1,195. Reviewed with Dr. Roselie Awkward. Per Dr. Roselie Awkward patient to follow up on day 7 for another hCG. Patient to go to MAU on Saturday day 7 for hCG as clinic is closed. I reviewed this with patient. Patient verbalized understanding.   Patient's BP today was elevated at 145/99. BP recheck approximately 5 minutes later was 136/96. Patient states she has high blood pressure and is currently taking Hydrochlorothiazide as prescribed by the ED. I reviewed this with Dr. Roselie Awkward. Per Dr. Roselie Awkward patient should follow up with her PCP to manage her BP. I explained this to patient and patient states she does not have a PCP. Appointment made for patient at the [facility] to establish PCP. I reviewed this with patient.   Patient denies any other questions or concerns.   Paulina Fusi, RN  02/07/22

## 2022-02-14 ENCOUNTER — Ambulatory Visit: Payer: Managed Care, Other (non HMO) | Admitting: Nurse Practitioner

## 2022-02-14 ENCOUNTER — Telehealth: Payer: Self-pay | Admitting: *Deleted

## 2022-02-14 DIAGNOSIS — O00102 Left tubal pregnancy without intrauterine pregnancy: Secondary | ICD-10-CM

## 2022-02-14 NOTE — Telephone Encounter (Signed)
Called and scheduled [place] for first available for 02/22/22 at 10:00. Informed Dr. Roselie Awkward could not get in this week and not until 02/22/22.  ?I called Kylie Hicks and informed her of appointment. I also informed her if she has severe pain or heavy bleeding to go to hospital for evaluation. She voices understanding. ?Staci Acosta ?

## 2022-02-14 NOTE — Telephone Encounter (Signed)
-----   Message from Woodroe Mode, MD sent at 02/13/2022  8:33 AM EST ----- ?She can have a f/u US this week ?

## 2022-02-22 ENCOUNTER — Ambulatory Visit: Admission: RE | Admit: 2022-02-22 | Payer: Managed Care, Other (non HMO) | Source: Ambulatory Visit

## 2022-03-09 ENCOUNTER — Ambulatory Visit: Payer: Managed Care, Other (non HMO) | Admitting: Certified Nurse Midwife

## 2022-06-01 ENCOUNTER — Other Ambulatory Visit: Payer: Self-pay

## 2022-06-01 ENCOUNTER — Encounter (HOSPITAL_COMMUNITY): Payer: Self-pay

## 2022-06-01 ENCOUNTER — Emergency Department (HOSPITAL_COMMUNITY)
Admission: EM | Admit: 2022-06-01 | Discharge: 2022-06-01 | Disposition: A | Discharge status: Home / Self Care | Payer: Commercial Managed Care - HMO | Attending: Emergency Medicine | Admitting: Emergency Medicine

## 2022-06-01 DIAGNOSIS — Z79899 Other long term (current) drug therapy: Secondary | ICD-10-CM | POA: Insufficient documentation

## 2022-06-01 DIAGNOSIS — L02412 Cutaneous abscess of left axilla: Secondary | ICD-10-CM

## 2022-06-01 DIAGNOSIS — M7989 Other specified soft tissue disorders: Secondary | ICD-10-CM | POA: Diagnosis present

## 2022-06-01 MED ORDER — CEPHALEXIN 500 MG PO CAPS: 500.0000 mg | ORAL_CAPSULE | Freq: Four times a day (QID) | ORAL | 0 refills | Status: AC

## 2022-06-01 MED ORDER — FENTANYL CITRATE PF 50 MCG/ML IJ SOSY: 25.0000 ug | PREFILLED_SYRINGE | Freq: Once | INTRAMUSCULAR | Status: DC

## 2022-06-01 MED ORDER — FENTANYL CITRATE PF 50 MCG/ML IJ SOSY
25.0000 ug | PREFILLED_SYRINGE | Freq: Once | INTRAMUSCULAR | Status: AC
  Administered 2022-06-01: 25 ug via INTRAMUSCULAR
  Filled 2022-06-01: qty 1

## 2022-06-01 MED ORDER — SULFAMETHOXAZOLE-TRIMETHOPRIM 800-160 MG PO TABS: 1.0000 | ORAL_TABLET | Freq: Two times a day (BID) | ORAL | 0 refills | Status: DC

## 2022-06-01 MED ORDER — LIDOCAINE HCL 2 % IJ SOLN
10.0000 mL | Freq: Once | INTRAMUSCULAR | Status: AC
Start: 2022-06-01 — End: 2022-06-01
  Administered 2022-06-01: 200 mg
  Filled 2022-06-01: qty 20

## 2022-06-01 NOTE — Discharge Instructions (Addendum)
Please fill the Keflex prescription.  Do NOT fill the Bactrim prescription since you are pregnant.  I would like for you to follow-up with the health department in 1 week to ensure we are going the right direction.  Please return to the emergency department for any worsening symptoms you might have.

## 2022-06-01 NOTE — ED Provider Notes (Signed)
[facility] DEPT Provider Note   CSN: [id number] Arrival date & time: 06/01/22  1812     History No chief complaint on file.   Kylie Hicks is a 36 y.o. female patient who presents the emergency department with tenderness and swelling over the left axilla.  Present for last couple days.  She does endorse similar symptoms in the past with changing deodorant.  Patient is unable to put her arm down secondary to pain.  She denies any fevers or chills.  HPI     Home Medications Prior to Admission medications   Medication Sig Start Date End Date Taking? Authorizing Provider  cephALEXin (KEFLEX) 500 MG capsule Take 1 capsule (500 mg total) by mouth 4 (four) times daily. 06/01/22  Yes Raul Del, Draxton Luu M, PA-C  acetaminophen (TYLENOL) 500 MG tablet Take 2 tablets (1,000 mg total) by mouth every 6 (six) hours as needed for moderate pain. 10/19/18   Tamala Julian, [place], CNM  fluticasone (FLONASE) 50 MCG/ACT nasal spray Place 2 sprays into both nostrils daily. Patient not taking: Reported on 02/07/2022 12/01/21   Couture, Cortni S, PA-C  guaiFENesin (MUCINEX) 600 MG 12 hr tablet Take 1 tablet (600 mg total) by mouth 2 (two) times daily. Patient not taking: Reported on 02/07/2022 12/01/21   Couture, Cortni S, PA-C  hydrochlorothiazide (HYDRODIURIL) 25 MG tablet Take 1 tablet (25 mg total) by mouth daily. 10/19/18   Tamala Julian, [place], CNM  hydrochlorothiazide (HYDRODIURIL) 25 MG tablet Take 1 tablet (25 mg total) by mouth daily. 12/01/21 12/31/21  Couture, Cortni S, PA-C  ondansetron (ZOFRAN ODT) 4 MG disintegrating tablet '4mg'$  ODT q4 hours prn nausea/vomit Patient not taking: Reported on 02/07/2022 07/11/19   Jacqlyn Larsen, PA-C      Allergies    Patient has no known allergies.    Review of Systems   Review of Systems  All other systems reviewed and are negative.   Physical Exam Updated Vital Signs Ht '5\' 3"'$  (1.6 m)   Wt 89.8 kg   LMP 12/25/2021 (Exact Date)   BMI  35.07 kg/m  Physical Exam Vitals and nursing note reviewed.  Constitutional:      Appearance: Normal appearance.  HENT:     Head: Normocephalic and atraumatic.  Eyes:     General:        Right eye: No discharge.        Left eye: No discharge.     Conjunctiva/sclera: Conjunctivae normal.  Pulmonary:     Effort: Pulmonary effort is normal.  Skin:    General: Skin is warm and dry.     Findings: No rash.     Comments: There is evidence of induration and fluctuance in the left axilla approximately 4-5cm in length.  Not actively draining.  Area is slightly warm to palpation.  Neurological:     General: No focal deficit present.     Mental Status: She is alert.  Psychiatric:        Mood and Affect: Mood normal.        Behavior: Behavior normal.     ED Results / Procedures / Treatments   Labs (all labs ordered are listed, but only abnormal results are displayed) Labs Reviewed - No data to display  EKG None  Radiology No results found.  Procedures Ultrasound ED Soft Tissue  Date/Time: 06/01/2022 8:17 PM  Performed by: Hendricks Limes, PA-C Authorized by: Hendricks Limes, PA-C   Procedure details:    Indications: localization of abscess  Transverse view:  Visualized   Longitudinal view:  Visualized   Images: not archived   Location:    Location: axilla     Side:  Left Findings:     abscess present .Marland KitchenIncision and Drainage  Date/Time: 06/01/2022 8:17 PM  Performed by: Hendricks Limes, PA-C Authorized by: Hendricks Limes, PA-C   Consent:    Consent obtained:  Verbal   Consent given by:  Patient   Risks, benefits, and alternatives were discussed: yes     Risks discussed:  Incomplete drainage and bleeding   Alternatives discussed:  No treatment Universal protocol:    Procedure explained and questions answered to patient or proxy's satisfaction: yes     Relevant documents present and verified: yes     Test results available : no     Imaging studies  available: no     Required blood products, implants, devices, and special equipment available: no     Site/side marked: no     Immediately prior to procedure, a time out was called: no     Patient identity confirmed:  Verbally with patient and arm band Location:    Type:  Abscess   Size:  4cm   Location:  Upper extremity   Upper extremity location: left axillary. Pre-procedure details:    Skin preparation:  Povidone-iodine Anesthesia:    Anesthesia method:  Local infiltration   Local anesthetic:  Lidocaine 2% w/o epi Procedure type:    Complexity:  Complex Procedure details:    Ultrasound guidance: yes     Incision types:  Single straight   Incision depth:  Dermal   Wound management:  Probed and deloculated and irrigated with saline   Drainage:  Purulent and bloody   Drainage amount:  Copious   Wound treatment:  Wound left open   Packing materials:  None Post-procedure details:    Procedure completion:  Tolerated well, no immediate complications     Medications Ordered in ED Medications  lidocaine (XYLOCAINE) 2 % (with pres) injection 200 mg (200 mg Infiltration Given 06/01/22 1917)  fentaNYL (SUBLIMAZE) injection 25 mcg (25 mcg Intramuscular Given 06/01/22 1858)  fentaNYL (SUBLIMAZE) injection 25 mcg (25 mcg Intramuscular Given 06/01/22 2020)    ED Course/ Medical Decision Making/ A&P                           Medical Decision Making Kylie Hicks is a 36 y.o. female patient who presents to the emergency department today for further evaluation of an abscess.  Patient has no systemic symptoms and I do not feel that labs or additional imaging is needed at this time.  I localized the abscess with bedside ultrasound.  Incision and drainage was performed at the bedside and copious amount of drainage was obtained.  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  No history of diabetes.  We will have her follow-up in the  outpatient setting.  I instructed her on wound care in addition to warm compresses at home.  Strict return precautions were discussed.  She is safe for discharge.   Risk Prescription drug management.   Final Clinical Impression(s) / ED Diagnoses Final diagnoses:  Abscess of axilla, left    Rx / DC Orders ED Discharge Orders          Ordered    sulfamethoxazole-trimethoprim (BACTRIM DS) 800-160 MG tablet  2 times daily,  Status:  Discontinued        06/01/22 2023    cephALEXin (KEFLEX) 500 MG capsule  4 times daily        06/01/22 2024              Myna Bright Perdido, PA-C 06/01/22 2025    Lorelle Gibbs, DO 06/01/22 2344

## 2022-06-01 NOTE — ED Triage Notes (Signed)
Patient c/o abscess under left arm. Patient states this occurs when using certain deodorants and soaps. Patient states there are sharp pains raidating to the left breast.

## 2022-06-06 ENCOUNTER — Encounter (HOSPITAL_COMMUNITY): Payer: Self-pay

## 2022-06-06 ENCOUNTER — Other Ambulatory Visit: Payer: Self-pay

## 2022-06-06 ENCOUNTER — Emergency Department (HOSPITAL_COMMUNITY)
Admission: EM | Admit: 2022-06-06 | Discharge: 2022-06-07 | Discharge status: Against Medical Advice | Payer: Commercial Managed Care - HMO | Attending: Emergency Medicine | Admitting: Emergency Medicine

## 2022-06-06 DIAGNOSIS — Z5321 Procedure and treatment not carried out due to patient leaving prior to being seen by health care provider: Secondary | ICD-10-CM | POA: Insufficient documentation

## 2022-06-06 DIAGNOSIS — R1030 Lower abdominal pain, unspecified: Secondary | ICD-10-CM | POA: Diagnosis present

## 2022-06-06 DIAGNOSIS — M545 Low back pain, unspecified: Secondary | ICD-10-CM | POA: Diagnosis not present

## 2022-06-06 DIAGNOSIS — R519 Headache, unspecified: Secondary | ICD-10-CM | POA: Insufficient documentation

## 2022-06-06 LAB — COMPREHENSIVE METABOLIC PANEL
ALT: 17 U/L (ref 0–44)
AST: 21 U/L (ref 15–41)
Albumin: 3.5 g/dL (ref 3.5–5.0)
Alkaline Phosphatase: 103 U/L (ref 38–126)
Anion gap: 7 (ref 5–15)
BUN: 16 mg/dL (ref 6–20)
CO2: 22 mmol/L (ref 22–32)
Calcium: 9 mg/dL (ref 8.9–10.3)
Chloride: 109 mmol/L (ref 98–111)
Creatinine, Ser: 1.09 mg/dL — ABNORMAL HIGH (ref 0.44–1.00)
GFR, Estimated: >60 mL/min (ref >60)
Glucose, Bld: 85 mg/dL (ref 70–99)
Potassium: 3.7 mmol/L (ref 3.5–5.1)
Sodium: 138 mmol/L (ref 135–145)
Total Bilirubin: 0.2 mg/dL — ABNORMAL LOW (ref 0.3–1.2)
Total Protein: 7.2 g/dL (ref 6.5–8.1)

## 2022-06-06 LAB — URINALYSIS, ROUTINE W REFLEX MICROSCOPIC
Bilirubin Urine: NEGATIVE
Glucose, UA: NEGATIVE mg/dL
Ketones, ur: NEGATIVE mg/dL
Nitrite: NEGATIVE
Protein, ur: NEGATIVE mg/dL
Specific Gravity, Urine: 1.019 (ref 1.005–1.030)
pH: 5 (ref 5.0–8.0)

## 2022-06-06 LAB — CBC
HCT: 38.8 % (ref 36.0–46.0)
Hemoglobin: 12.2 g/dL (ref 12.0–15.0)
MCH: 25.8 pg — ABNORMAL LOW (ref 26.0–34.0)
MCHC: 31.4 g/dL (ref 30.0–36.0)
MCV: 82.2 fL (ref 80.0–100.0)
Platelets: 408 10*3/uL — ABNORMAL HIGH (ref 150–400)
RBC: 4.72 MIL/uL (ref 3.87–5.11)
RDW: 14.7 % (ref 11.5–15.5)
WBC: 5.8 10*3/uL (ref 4.0–10.5)
nRBC: 0 % (ref 0.0–0.2)

## 2022-06-06 LAB — LIPASE, BLOOD: Lipase: 25 U/L (ref 11–51)

## 2022-06-06 LAB — I-STAT BETA HCG BLOOD, ED (MC, WL, AP ONLY): I-stat hCG, quantitative: <5 m[IU]/mL (ref <5)

## 2022-06-06 NOTE — ED Triage Notes (Signed)
Pt arrived POV from home c/o lower abdominal pain and lower back pain that started on Sunday. Pt states it is feeling like cramping. Pt had some spotting as well and started with a headache as well.

## 2022-06-06 NOTE — ED Notes (Signed)
Called patient several times patent didn't answer

## 2023-01-29 IMAGING — US US OB TRANSVAGINAL
1 series · 15 of 28 positions shown · non-contrast
Comparison: January 31, 2022

CLINICAL DATA: Left lower quadrant pain and vaginal spotting.

EXAM:
OBSTETRIC <14 WK US AND TRANSVAGINAL OB US
TECHNIQUE: Both transabdominal and transvaginal ultrasound examinations were
performed for complete evaluation of the gestation as well as the
maternal uterus, adnexal regions, and pelvic cul-de-sac.
Transvaginal technique was performed to assess early pregnancy.

[Series 1: us ob transvaginal · 45 acquisitions, 15 frames shown]
[im 1/45]
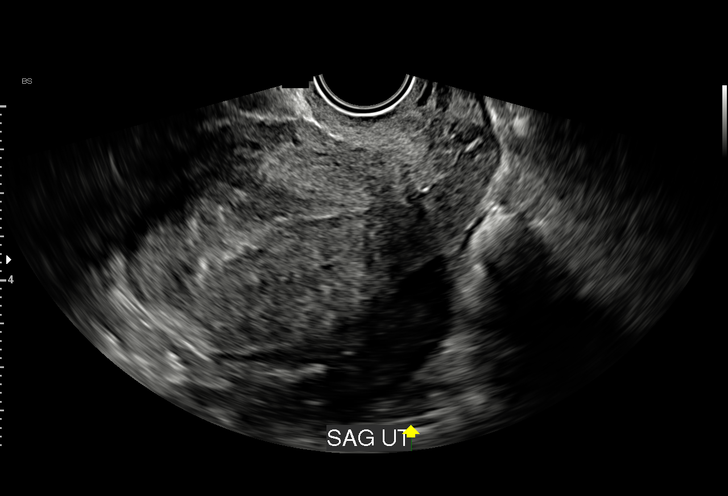
[im 4/45]
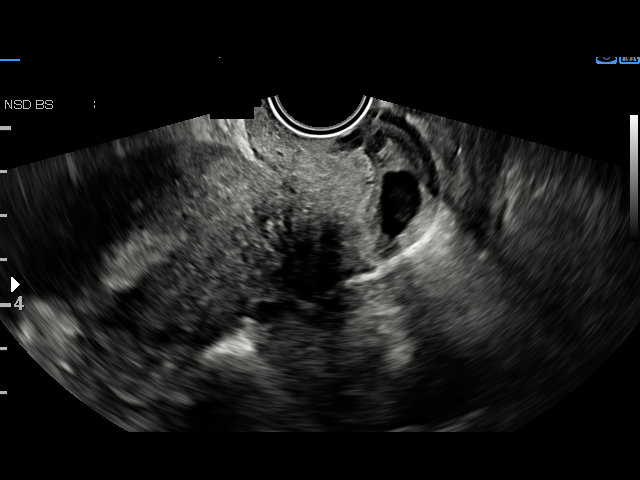
[im 7/45]
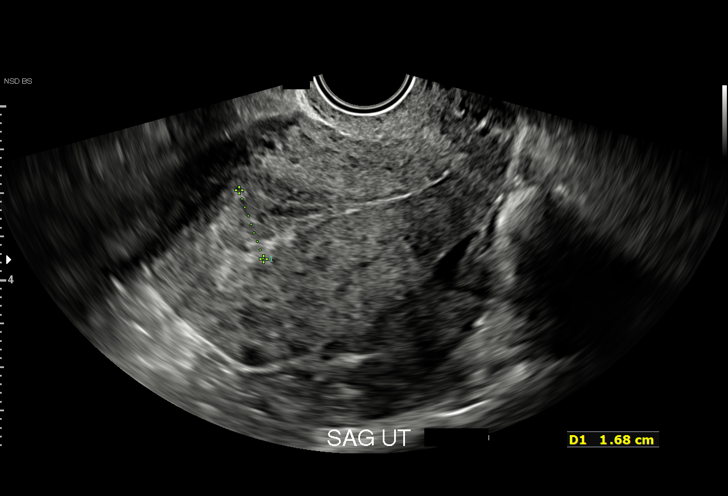
[im 10/45]
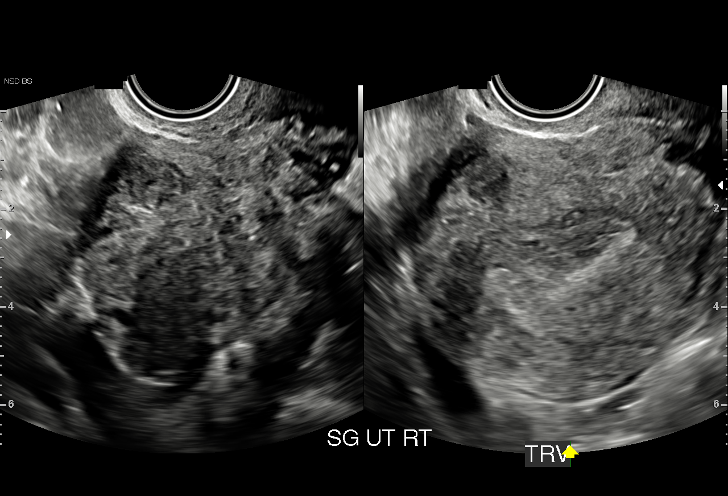
[im 14/45]
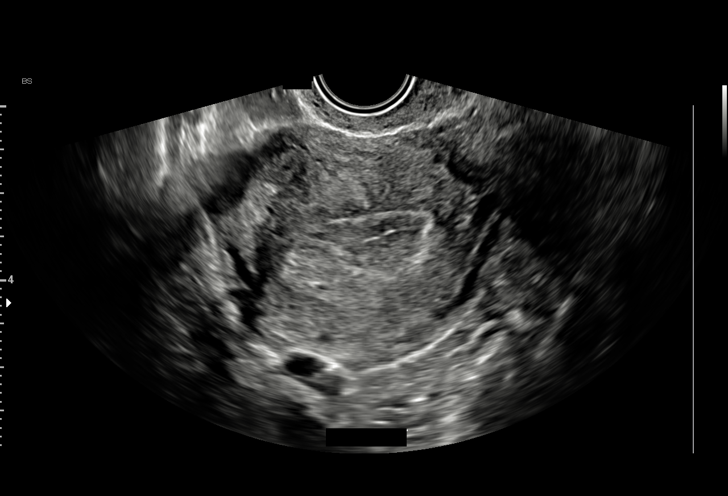
[im 17/45]
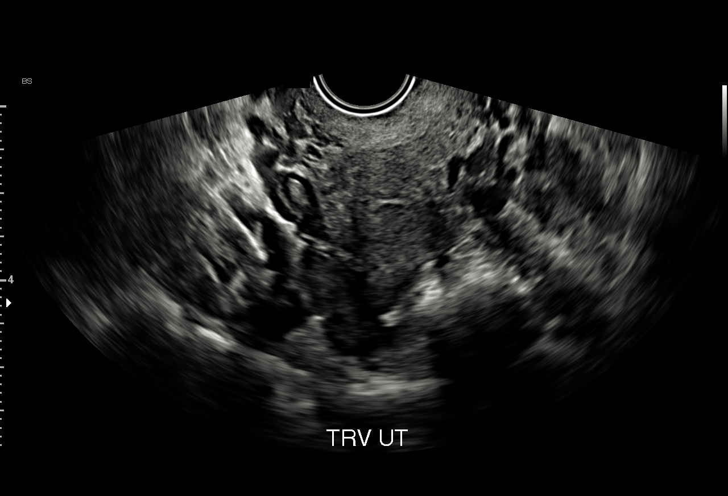
[im 20/45]
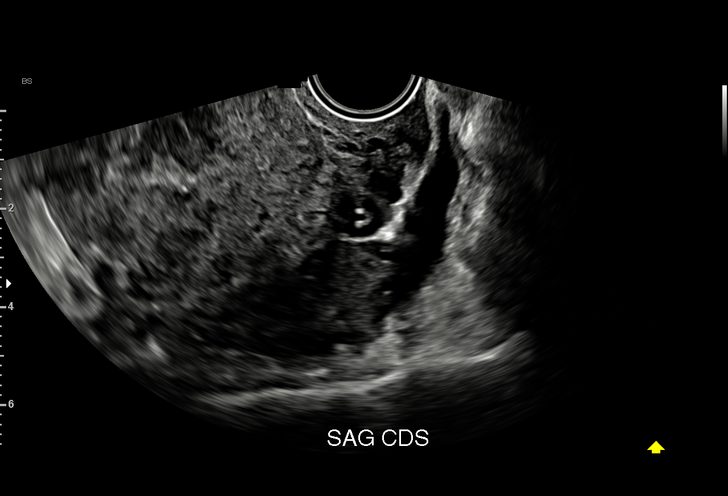
[im 23/45]
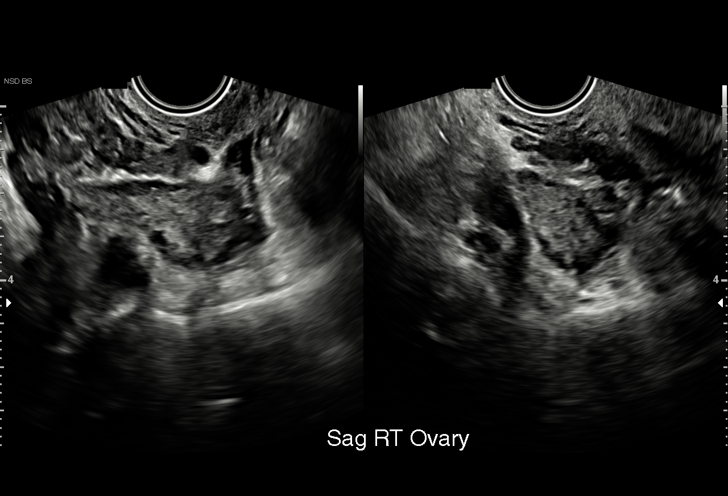
[im 25/45]
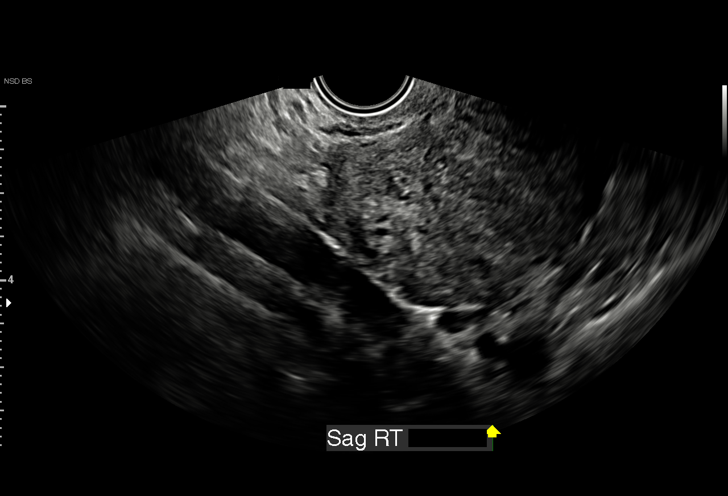
[im 28/45]
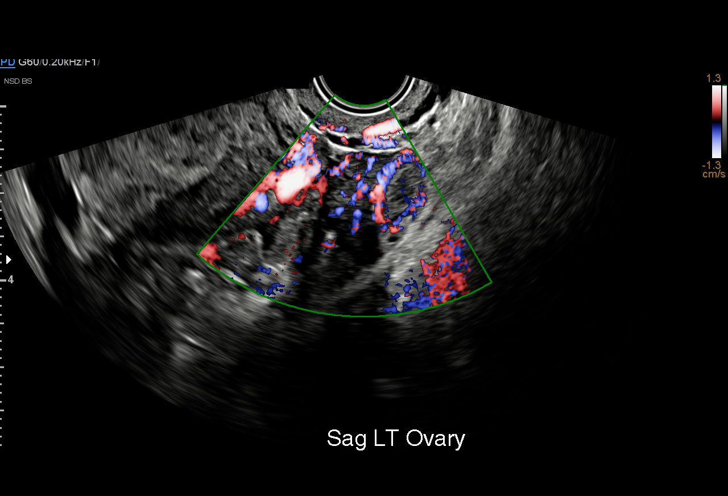
[im 31/45]
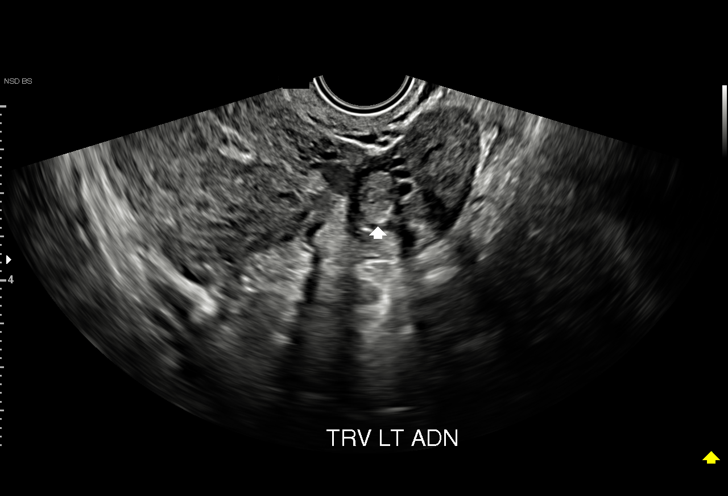
[im 35/45]
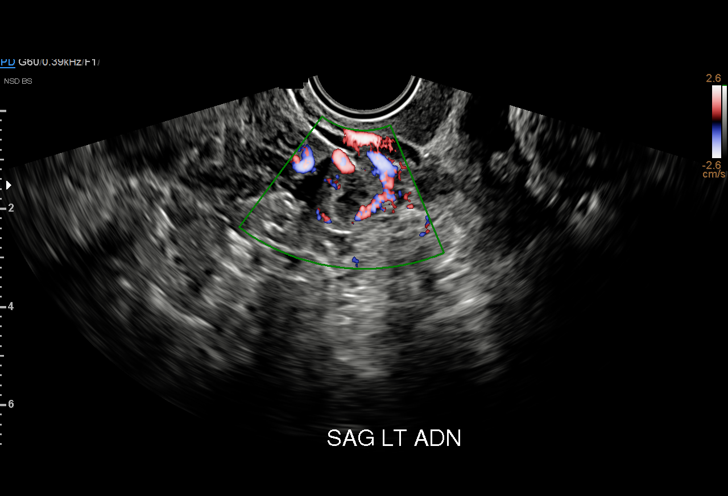
[im 38/45]
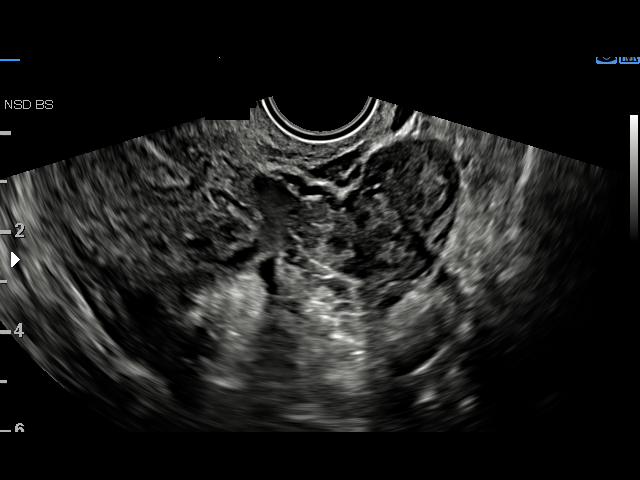
[im 41/45]
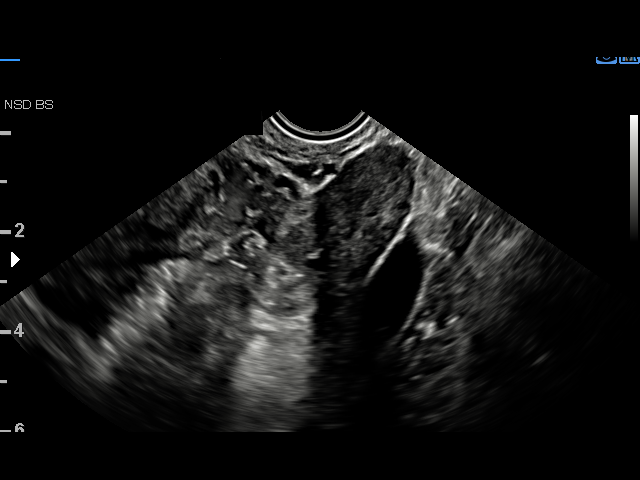
[im 45/45]
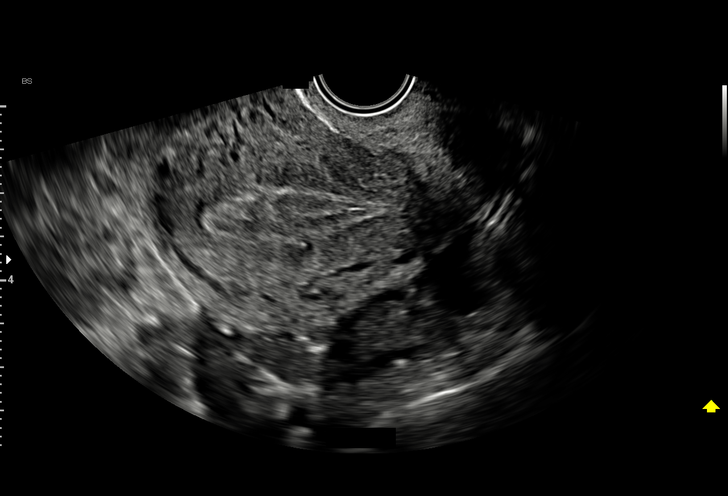

[15 of 28 positions shown; findings below may reference images not displayed]

FINDINGS: Intrauterine gestational sac: None

Yolk sac:  Not Visualized.

Embryo:  Not Visualized.

Cardiac Activity: Not Visualized.

Heart Rate: N/A  bpm

Subchorionic hemorrhage:  None visualized.

Maternal uterus/adnexae: A 1.4 cm x 1.1 cm x 1.1 cm heterogeneous
fibroid is seen within the uterus on the right.

The endometrium is thickened (16.8 mm) and heterogeneous in
appearance.

The right ovary measures 4.1 cm x 2.0 cm x 1.9 cm and is otherwise
normal in appearance.

The left ovary measures 4.0 cm x 2.6 cm x 2.1 cm, with an adjacent
1.6 cm by 1.2 cm x 1.0 cm heterogeneous left adnexal mass noted. A
surrounding hypervascular rim is noted on color Doppler evaluation.

A small amount of nonhemorrhagic pelvic free fluid is noted.
IMPRESSION: 1. Thickened, heterogeneous endometrium, without evidence of an
intrauterine pregnancy.
2. Additional findings suspicious for a LEFT ectopic pregnancy.
Correlation with follow-up pelvic ultrasound and serial beta HCG
levels is recommended.

## 2023-02-15 DIAGNOSIS — Z419 Encounter for procedure for purposes other than remedying health state, unspecified: Secondary | ICD-10-CM | POA: Diagnosis not present

## 2023-03-18 DIAGNOSIS — Z419 Encounter for procedure for purposes other than remedying health state, unspecified: Secondary | ICD-10-CM | POA: Diagnosis not present

## 2023-04-17 DIAGNOSIS — Z419 Encounter for procedure for purposes other than remedying health state, unspecified: Secondary | ICD-10-CM | POA: Diagnosis not present

## 2023-05-18 DIAGNOSIS — Z419 Encounter for procedure for purposes other than remedying health state, unspecified: Secondary | ICD-10-CM | POA: Diagnosis not present

## 2023-06-17 DIAGNOSIS — Z419 Encounter for procedure for purposes other than remedying health state, unspecified: Secondary | ICD-10-CM | POA: Diagnosis not present

## 2023-07-18 DIAGNOSIS — Z419 Encounter for procedure for purposes other than remedying health state, unspecified: Secondary | ICD-10-CM | POA: Diagnosis not present

## 2023-08-18 DIAGNOSIS — Z419 Encounter for procedure for purposes other than remedying health state, unspecified: Secondary | ICD-10-CM | POA: Diagnosis not present

## 2023-09-17 DIAGNOSIS — Z419 Encounter for procedure for purposes other than remedying health state, unspecified: Secondary | ICD-10-CM | POA: Diagnosis not present

## 2023-10-18 DIAGNOSIS — Z419 Encounter for procedure for purposes other than remedying health state, unspecified: Secondary | ICD-10-CM | POA: Diagnosis not present

## 2023-11-17 DIAGNOSIS — Z419 Encounter for procedure for purposes other than remedying health state, unspecified: Secondary | ICD-10-CM | POA: Diagnosis not present

## 2023-11-30 DIAGNOSIS — O209 Hemorrhage in early pregnancy, unspecified: Secondary | ICD-10-CM | POA: Diagnosis not present

## 2023-11-30 DIAGNOSIS — O26891 Other specified pregnancy related conditions, first trimester: Secondary | ICD-10-CM | POA: Diagnosis not present

## 2023-11-30 DIAGNOSIS — Z3A00 Weeks of gestation of pregnancy not specified: Secondary | ICD-10-CM | POA: Diagnosis not present

## 2023-11-30 DIAGNOSIS — R109 Unspecified abdominal pain: Secondary | ICD-10-CM | POA: Diagnosis not present

## 2023-12-01 DIAGNOSIS — Z3A00 Weeks of gestation of pregnancy not specified: Secondary | ICD-10-CM | POA: Diagnosis not present

## 2023-12-01 DIAGNOSIS — O26891 Other specified pregnancy related conditions, first trimester: Secondary | ICD-10-CM | POA: Diagnosis not present

## 2023-12-01 DIAGNOSIS — R109 Unspecified abdominal pain: Secondary | ICD-10-CM | POA: Diagnosis not present

## 2023-12-16 DIAGNOSIS — O200 Threatened abortion: Secondary | ICD-10-CM | POA: Diagnosis not present

## 2023-12-16 DIAGNOSIS — O209 Hemorrhage in early pregnancy, unspecified: Secondary | ICD-10-CM | POA: Diagnosis not present

## 2023-12-16 DIAGNOSIS — Z3A00 Weeks of gestation of pregnancy not specified: Secondary | ICD-10-CM | POA: Diagnosis not present

## 2023-12-16 DIAGNOSIS — Z3A01 Less than 8 weeks gestation of pregnancy: Secondary | ICD-10-CM | POA: Diagnosis not present

## 2023-12-17 DIAGNOSIS — O209 Hemorrhage in early pregnancy, unspecified: Secondary | ICD-10-CM | POA: Diagnosis not present

## 2023-12-17 DIAGNOSIS — Z3A01 Less than 8 weeks gestation of pregnancy: Secondary | ICD-10-CM | POA: Diagnosis not present

## 2023-12-18 DIAGNOSIS — Z419 Encounter for procedure for purposes other than remedying health state, unspecified: Secondary | ICD-10-CM | POA: Diagnosis not present

## 2023-12-29 DIAGNOSIS — Z419 Encounter for procedure for purposes other than remedying health state, unspecified: Secondary | ICD-10-CM | POA: Diagnosis not present

## 2024-01-04 DIAGNOSIS — N390 Urinary tract infection, site not specified: Secondary | ICD-10-CM | POA: Diagnosis not present

## 2024-01-04 DIAGNOSIS — R0902 Hypoxemia: Secondary | ICD-10-CM | POA: Diagnosis not present

## 2024-01-04 DIAGNOSIS — A5602 Chlamydial vulvovaginitis: Secondary | ICD-10-CM | POA: Diagnosis not present

## 2024-01-04 DIAGNOSIS — O200 Threatened abortion: Secondary | ICD-10-CM | POA: Diagnosis not present

## 2024-01-04 DIAGNOSIS — R55 Syncope and collapse: Secondary | ICD-10-CM | POA: Diagnosis not present

## 2024-01-04 DIAGNOSIS — O039 Complete or unspecified spontaneous abortion without complication: Secondary | ICD-10-CM | POA: Diagnosis not present

## 2024-01-04 DIAGNOSIS — I10 Essential (primary) hypertension: Secondary | ICD-10-CM | POA: Diagnosis not present

## 2024-01-18 DIAGNOSIS — Z419 Encounter for procedure for purposes other than remedying health state, unspecified: Secondary | ICD-10-CM | POA: Diagnosis not present

## 2024-01-20 DIAGNOSIS — O034 Incomplete spontaneous abortion without complication: Secondary | ICD-10-CM | POA: Diagnosis not present

## 2024-01-20 DIAGNOSIS — D649 Anemia, unspecified: Secondary | ICD-10-CM | POA: Diagnosis not present

## 2024-01-20 DIAGNOSIS — E162 Hypoglycemia, unspecified: Secondary | ICD-10-CM | POA: Diagnosis not present

## 2024-02-15 DIAGNOSIS — Z419 Encounter for procedure for purposes other than remedying health state, unspecified: Secondary | ICD-10-CM | POA: Diagnosis not present

## 2024-03-16 DIAGNOSIS — Z3201 Encounter for pregnancy test, result positive: Secondary | ICD-10-CM | POA: Diagnosis not present

## 2024-03-28 DIAGNOSIS — Z419 Encounter for procedure for purposes other than remedying health state, unspecified: Secondary | ICD-10-CM | POA: Diagnosis not present

## 2024-04-27 DIAGNOSIS — Z419 Encounter for procedure for purposes other than remedying health state, unspecified: Secondary | ICD-10-CM | POA: Diagnosis not present

## 2024-05-28 DIAGNOSIS — Z419 Encounter for procedure for purposes other than remedying health state, unspecified: Secondary | ICD-10-CM | POA: Diagnosis not present

## 2024-06-27 DIAGNOSIS — Z419 Encounter for procedure for purposes other than remedying health state, unspecified: Secondary | ICD-10-CM | POA: Diagnosis not present
